# Patient Record
Sex: Female | Born: 1955
Health system: Southern US, Community
[De-identification: ages and names within clinical notes are randomized; demographics above are authoritative.]

## PROBLEM LIST (undated history)

## (undated) DIAGNOSIS — I10 Essential (primary) hypertension: Secondary | ICD-10-CM

## (undated) DIAGNOSIS — M869 Osteomyelitis, unspecified: Secondary | ICD-10-CM

## (undated) DIAGNOSIS — I509 Heart failure, unspecified: Secondary | ICD-10-CM

## (undated) DIAGNOSIS — G629 Polyneuropathy, unspecified: Secondary | ICD-10-CM

## (undated) DIAGNOSIS — K219 Gastro-esophageal reflux disease without esophagitis: Secondary | ICD-10-CM

## (undated) DIAGNOSIS — F419 Anxiety disorder, unspecified: Secondary | ICD-10-CM

## (undated) DIAGNOSIS — E7800 Pure hypercholesterolemia, unspecified: Secondary | ICD-10-CM

## (undated) HISTORY — PX: DEBRIDEMENT  FOOT: SUR387

## (undated) HISTORY — DX: Gastro-esophageal reflux disease without esophagitis: K21.9

## (undated) HISTORY — DX: Osteomyelitis, unspecified: M86.9

## (undated) HISTORY — PX: ABDOMINAL HYSTERECTOMY: SHX81

---

## 1998-12-11 ENCOUNTER — Emergency Department (HOSPITAL_COMMUNITY): Admission: EM | Admit: 1998-12-11 | Discharge: 1998-12-11 | Payer: Self-pay | Admitting: Emergency Medicine

## 1999-06-09 ENCOUNTER — Emergency Department (HOSPITAL_COMMUNITY): Admission: EM | Admit: 1999-06-09 | Discharge: 1999-06-09 | Payer: Self-pay

## 1999-06-12 ENCOUNTER — Emergency Department (HOSPITAL_COMMUNITY): Admission: EM | Admit: 1999-06-12 | Discharge: 1999-06-12 | Payer: Self-pay | Admitting: Emergency Medicine

## 1999-06-18 ENCOUNTER — Encounter: Admission: RE | Admit: 1999-06-18 | Discharge: 1999-06-18 | Payer: Self-pay | Admitting: Internal Medicine

## 1999-07-31 ENCOUNTER — Encounter: Admission: RE | Admit: 1999-07-31 | Discharge: 1999-10-29 | Payer: Self-pay | Admitting: Internal Medicine

## 1999-11-30 ENCOUNTER — Emergency Department (HOSPITAL_COMMUNITY): Admission: EM | Admit: 1999-11-30 | Discharge: 1999-11-30 | Payer: Self-pay | Admitting: Emergency Medicine

## 2000-05-30 ENCOUNTER — Emergency Department (HOSPITAL_COMMUNITY): Admission: EM | Admit: 2000-05-30 | Discharge: 2000-05-30 | Payer: Self-pay | Admitting: Emergency Medicine

## 2001-07-09 ENCOUNTER — Encounter: Admission: RE | Admit: 2001-07-09 | Discharge: 2001-07-09 | Payer: Self-pay | Admitting: Obstetrics

## 2002-10-18 ENCOUNTER — Emergency Department (HOSPITAL_COMMUNITY): Admission: EM | Admit: 2002-10-18 | Discharge: 2002-10-18 | Payer: Self-pay | Admitting: *Deleted

## 2003-08-23 ENCOUNTER — Emergency Department (HOSPITAL_COMMUNITY): Admission: EM | Admit: 2003-08-23 | Discharge: 2003-08-23 | Payer: Self-pay | Admitting: Emergency Medicine

## 2003-08-26 ENCOUNTER — Emergency Department (HOSPITAL_COMMUNITY): Admission: EM | Admit: 2003-08-26 | Discharge: 2003-08-26 | Payer: Self-pay | Admitting: Emergency Medicine

## 2004-03-23 ENCOUNTER — Emergency Department (HOSPITAL_COMMUNITY): Admission: EM | Admit: 2004-03-23 | Discharge: 2004-03-23 | Payer: Self-pay | Admitting: *Deleted

## 2004-09-06 ENCOUNTER — Emergency Department (HOSPITAL_COMMUNITY): Admission: EM | Admit: 2004-09-06 | Discharge: 2004-09-06 | Payer: Self-pay | Admitting: Emergency Medicine

## 2004-09-12 ENCOUNTER — Emergency Department (HOSPITAL_COMMUNITY): Admission: EM | Admit: 2004-09-12 | Discharge: 2004-09-12 | Payer: Self-pay | Admitting: Family Medicine

## 2005-04-15 ENCOUNTER — Emergency Department (HOSPITAL_COMMUNITY): Admission: EM | Admit: 2005-04-15 | Discharge: 2005-04-15 | Payer: Self-pay | Admitting: Emergency Medicine

## 2006-05-15 ENCOUNTER — Emergency Department (HOSPITAL_COMMUNITY): Admission: EM | Admit: 2006-05-15 | Discharge: 2006-05-15 | Payer: Self-pay | Admitting: Emergency Medicine

## 2006-12-22 ENCOUNTER — Emergency Department (HOSPITAL_COMMUNITY): Admission: EM | Admit: 2006-12-22 | Discharge: 2006-12-22 | Payer: Self-pay | Admitting: Emergency Medicine

## 2007-07-25 ENCOUNTER — Emergency Department (HOSPITAL_COMMUNITY): Admission: EM | Admit: 2007-07-25 | Discharge: 2007-07-25 | Payer: Self-pay | Admitting: Emergency Medicine

## 2007-09-01 ENCOUNTER — Emergency Department (HOSPITAL_COMMUNITY): Admission: EM | Admit: 2007-09-01 | Discharge: 2007-09-01 | Payer: Self-pay | Admitting: Emergency Medicine

## 2007-09-03 ENCOUNTER — Emergency Department (HOSPITAL_COMMUNITY): Admission: EM | Admit: 2007-09-03 | Discharge: 2007-09-03 | Payer: Self-pay | Admitting: Emergency Medicine

## 2007-09-09 ENCOUNTER — Ambulatory Visit: Payer: Self-pay | Admitting: *Deleted

## 2007-09-12 ENCOUNTER — Emergency Department (HOSPITAL_COMMUNITY): Admission: EM | Admit: 2007-09-12 | Discharge: 2007-09-12 | Payer: Self-pay | Admitting: Emergency Medicine

## 2007-09-16 ENCOUNTER — Ambulatory Visit: Payer: Self-pay | Admitting: Family Medicine

## 2007-11-10 ENCOUNTER — Ambulatory Visit: Payer: Self-pay | Admitting: Family Medicine

## 2007-12-18 ENCOUNTER — Ambulatory Visit: Payer: Self-pay | Admitting: Family Medicine

## 2008-01-04 ENCOUNTER — Ambulatory Visit (HOSPITAL_COMMUNITY): Admission: RE | Admit: 2008-01-04 | Discharge: 2008-01-04 | Payer: Self-pay | Admitting: Family Medicine

## 2008-01-29 ENCOUNTER — Ambulatory Visit: Payer: Self-pay | Admitting: Family Medicine

## 2008-02-26 ENCOUNTER — Emergency Department (HOSPITAL_COMMUNITY): Admission: EM | Admit: 2008-02-26 | Discharge: 2008-02-26 | Payer: Self-pay | Admitting: Emergency Medicine

## 2008-07-12 ENCOUNTER — Ambulatory Visit: Payer: Self-pay | Admitting: Family Medicine

## 2008-08-15 ENCOUNTER — Ambulatory Visit: Payer: Self-pay | Admitting: Internal Medicine

## 2008-08-15 ENCOUNTER — Encounter (INDEPENDENT_AMBULATORY_CARE_PROVIDER_SITE_OTHER): Payer: Self-pay | Admitting: Family Medicine

## 2008-08-15 LAB — CONVERTED CEMR LAB
BUN: 8 mg/dL (ref 6–23)
Basophils Absolute: 0 10*3/uL (ref 0.0–0.1)
Basophils Relative: 0 % (ref 0–1)
CO2: 26 meq/L (ref 19–32)
Calcium: 8.9 mg/dL (ref 8.4–10.5)
Chloride: 106 meq/L (ref 96–112)
Cholesterol: 301 mg/dL — ABNORMAL HIGH (ref 0–200)
Creatinine, Ser: 0.78 mg/dL (ref 0.40–1.20)
Eosinophils Absolute: 0.1 10*3/uL (ref 0.0–0.7)
Eosinophils Relative: 2 % (ref 0–5)
Glucose, Bld: 145 mg/dL — ABNORMAL HIGH (ref 70–99)
HCT: 43.2 % (ref 36.0–46.0)
HDL: 55 mg/dL (ref >39)
Hemoglobin: 13 g/dL (ref 12.0–15.0)
LDL Cholesterol: 218 mg/dL — ABNORMAL HIGH (ref 0–99)
Lymphocytes Relative: 27 % (ref 12–46)
Lymphs Abs: 2 10*3/uL (ref 0.7–4.0)
MCHC: 30.1 g/dL (ref 30.0–36.0)
MCV: 81.2 fL (ref 78.0–100.0)
Monocytes Absolute: 0.9 10*3/uL (ref 0.1–1.0)
Monocytes Relative: 11 % (ref 3–12)
Neutro Abs: 4.5 10*3/uL (ref 1.7–7.7)
Neutrophils Relative %: 60 % (ref 43–77)
Platelets: 323 10*3/uL (ref 150–400)
Potassium: 4.7 meq/L (ref 3.5–5.3)
RBC: 5.32 M/uL — ABNORMAL HIGH (ref 3.87–5.11)
RDW: 16.9 % — ABNORMAL HIGH (ref 11.5–15.5)
Sed Rate: 12 mm/hr (ref 0–22)
Sodium: 141 meq/L (ref 135–145)
Total CHOL/HDL Ratio: 5.5
Triglycerides: 139 mg/dL (ref <150)
VLDL: 28 mg/dL (ref 0–40)
Vit D, 1,25-Dihydroxy: 12 — ABNORMAL LOW (ref 30–89)
WBC: 7.5 10*3/uL (ref 4.0–10.5)

## 2008-08-22 ENCOUNTER — Emergency Department (HOSPITAL_COMMUNITY): Admission: EM | Admit: 2008-08-22 | Discharge: 2008-08-22 | Payer: Self-pay | Admitting: Emergency Medicine

## 2008-10-05 ENCOUNTER — Emergency Department (HOSPITAL_COMMUNITY): Admission: EM | Admit: 2008-10-05 | Discharge: 2008-10-05 | Payer: Self-pay | Admitting: Emergency Medicine

## 2008-11-24 ENCOUNTER — Emergency Department (HOSPITAL_COMMUNITY): Admission: EM | Admit: 2008-11-24 | Discharge: 2008-11-24 | Payer: Self-pay | Admitting: Emergency Medicine

## 2008-12-23 ENCOUNTER — Ambulatory Visit: Payer: Self-pay | Admitting: Family Medicine

## 2009-03-03 ENCOUNTER — Ambulatory Visit: Payer: Self-pay | Admitting: Family Medicine

## 2009-03-03 LAB — CONVERTED CEMR LAB: Microalb, Ur: 0.5 mg/dL (ref 0.00–1.89)

## 2009-04-27 ENCOUNTER — Ambulatory Visit: Payer: Self-pay | Admitting: Family Medicine

## 2009-05-08 ENCOUNTER — Emergency Department (HOSPITAL_COMMUNITY): Admission: EM | Admit: 2009-05-08 | Discharge: 2009-05-08 | Payer: Self-pay | Admitting: Emergency Medicine

## 2009-05-22 ENCOUNTER — Ambulatory Visit: Payer: Self-pay | Admitting: Internal Medicine

## 2009-07-25 ENCOUNTER — Emergency Department (HOSPITAL_COMMUNITY): Admission: EM | Admit: 2009-07-25 | Discharge: 2009-07-25 | Payer: Self-pay | Admitting: Emergency Medicine

## 2009-08-01 ENCOUNTER — Ambulatory Visit: Payer: Self-pay | Admitting: Family Medicine

## 2009-12-07 ENCOUNTER — Emergency Department (HOSPITAL_COMMUNITY): Admission: EM | Admit: 2009-12-07 | Discharge: 2009-12-07 | Payer: Self-pay | Admitting: Family Medicine

## 2010-01-25 ENCOUNTER — Emergency Department (HOSPITAL_COMMUNITY): Admission: EM | Admit: 2010-01-25 | Discharge: 2010-01-25 | Payer: Self-pay | Admitting: Emergency Medicine

## 2010-03-09 ENCOUNTER — Emergency Department (HOSPITAL_COMMUNITY): Admission: EM | Admit: 2010-03-09 | Discharge: 2010-03-09 | Payer: Self-pay | Admitting: Emergency Medicine

## 2010-04-17 ENCOUNTER — Emergency Department (HOSPITAL_COMMUNITY): Admission: EM | Admit: 2010-04-17 | Discharge: 2010-04-17 | Payer: Self-pay | Admitting: Family Medicine

## 2010-04-20 ENCOUNTER — Ambulatory Visit: Payer: Self-pay | Admitting: Family Medicine

## 2010-04-24 ENCOUNTER — Emergency Department (HOSPITAL_COMMUNITY): Admission: EM | Admit: 2010-04-24 | Discharge: 2010-04-24 | Payer: Self-pay | Admitting: Family Medicine

## 2010-04-27 ENCOUNTER — Emergency Department (HOSPITAL_COMMUNITY): Admission: EM | Admit: 2010-04-27 | Discharge: 2010-04-27 | Payer: Self-pay | Admitting: Emergency Medicine

## 2010-05-24 ENCOUNTER — Emergency Department (HOSPITAL_COMMUNITY): Admission: EM | Admit: 2010-05-24 | Discharge: 2010-05-24 | Payer: Self-pay | Admitting: Family Medicine

## 2010-06-21 ENCOUNTER — Ambulatory Visit: Payer: Self-pay | Admitting: Family Medicine

## 2010-11-20 ENCOUNTER — Encounter (INDEPENDENT_AMBULATORY_CARE_PROVIDER_SITE_OTHER): Payer: Self-pay | Admitting: Family Medicine

## 2010-11-20 LAB — CONVERTED CEMR LAB
ALT: 22 units/L (ref 0–35)
AST: 17 units/L (ref 0–37)
Albumin: 3.9 g/dL (ref 3.5–5.2)
Alkaline Phosphatase: 63 units/L (ref 39–117)
BUN: 13 mg/dL (ref 6–23)
CO2: 25 meq/L (ref 19–32)
Calcium: 9.7 mg/dL (ref 8.4–10.5)
Chloride: 103 meq/L (ref 96–112)
Cholesterol: 311 mg/dL — ABNORMAL HIGH (ref 0–200)
Creatinine, Ser: 0.8 mg/dL (ref 0.40–1.20)
Glucose, Bld: 219 mg/dL — ABNORMAL HIGH (ref 70–99)
HDL: 66 mg/dL (ref >39)
LDL Cholesterol: 196 mg/dL — ABNORMAL HIGH (ref 0–99)
Potassium: 5 meq/L (ref 3.5–5.3)
Sodium: 140 meq/L (ref 135–145)
Total Bilirubin: 0.5 mg/dL (ref 0.3–1.2)
Total CHOL/HDL Ratio: 4.7
Total Protein: 7.1 g/dL (ref 6.0–8.3)
Triglycerides: 243 mg/dL — ABNORMAL HIGH (ref <150)
VLDL: 49 mg/dL — ABNORMAL HIGH (ref 0–40)

## 2011-01-10 ENCOUNTER — Inpatient Hospital Stay (INDEPENDENT_AMBULATORY_CARE_PROVIDER_SITE_OTHER)
Admission: RE | Admit: 2011-01-10 | Discharge: 2011-01-10 | Disposition: A | Discharge status: Home / Self Care | Payer: Self-pay | Source: Ambulatory Visit | Attending: Emergency Medicine | Admitting: Emergency Medicine

## 2011-01-10 DIAGNOSIS — J019 Acute sinusitis, unspecified: Secondary | ICD-10-CM

## 2011-01-10 DIAGNOSIS — J40 Bronchitis, not specified as acute or chronic: Secondary | ICD-10-CM

## 2011-02-10 LAB — WET PREP, GENITAL: Trich, Wet Prep: NONE SEEN

## 2011-02-11 LAB — GC/CHLAMYDIA PROBE AMP, GENITAL
Chlamydia, DNA Probe: NEGATIVE
GC Probe Amp, Genital: NEGATIVE

## 2011-02-11 LAB — POCT URINALYSIS DIP (DEVICE)
Glucose, UA: >=1000 mg/dL — AB
Ketones, ur: 80 mg/dL — AB
Nitrite: NEGATIVE
Protein, ur: NEGATIVE mg/dL
Specific Gravity, Urine: 1.01 (ref 1.005–1.030)
Urobilinogen, UA: 0.2 mg/dL (ref 0.0–1.0)
pH: 5 (ref 5.0–8.0)

## 2011-02-11 LAB — POCT I-STAT, CHEM 8
BUN: 9 mg/dL (ref 6–23)
Calcium, Ion: 1.15 mmol/L (ref 1.12–1.32)
Chloride: 101 mEq/L (ref 96–112)
Creatinine, Ser: 0.9 mg/dL (ref 0.4–1.2)
Glucose, Bld: 399 mg/dL — ABNORMAL HIGH (ref 70–99)
HCT: 57 % — ABNORMAL HIGH (ref 36.0–46.0)
Hemoglobin: 19.4 g/dL — ABNORMAL HIGH (ref 12.0–15.0)
Potassium: 4.3 mEq/L (ref 3.5–5.1)
Sodium: 134 mEq/L — ABNORMAL LOW (ref 135–145)
TCO2: 23 mmol/L (ref 0–100)

## 2011-02-11 LAB — CULTURE, ROUTINE-ABSCESS

## 2011-02-11 LAB — GLUCOSE, CAPILLARY: Glucose-Capillary: 401 mg/dL — ABNORMAL HIGH (ref 70–99)

## 2011-02-11 LAB — WET PREP, GENITAL
Trich, Wet Prep: NONE SEEN
Yeast Wet Prep HPF POC: NONE SEEN

## 2011-02-11 LAB — URINE CULTURE: Colony Count: >=100000

## 2011-02-12 ENCOUNTER — Encounter (INDEPENDENT_AMBULATORY_CARE_PROVIDER_SITE_OTHER): Payer: Self-pay | Admitting: Family Medicine

## 2011-02-12 LAB — URINALYSIS, ROUTINE W REFLEX MICROSCOPIC
Bilirubin Urine: NEGATIVE
Glucose, UA: >1000 mg/dL — AB
Hgb urine dipstick: NEGATIVE
Ketones, ur: NEGATIVE mg/dL
Leukocytes, UA: NEGATIVE
Nitrite: NEGATIVE
Protein, ur: NEGATIVE mg/dL
Specific Gravity, Urine: 1.029 (ref 1.005–1.030)
Urobilinogen, UA: 0.2 mg/dL (ref 0.0–1.0)
pH: 5 (ref 5.0–8.0)

## 2011-02-12 LAB — URINE MICROSCOPIC-ADD ON

## 2011-02-12 LAB — CONVERTED CEMR LAB: Microalb, Ur: 0.5 mg/dL (ref 0.00–1.89)

## 2011-02-17 LAB — URINE MICROSCOPIC-ADD ON

## 2011-02-17 LAB — URINALYSIS, ROUTINE W REFLEX MICROSCOPIC
Bilirubin Urine: NEGATIVE
Glucose, UA: >1000 mg/dL — AB
Hgb urine dipstick: NEGATIVE
Ketones, ur: 15 mg/dL — AB
Leukocytes, UA: NEGATIVE
Nitrite: NEGATIVE
Protein, ur: NEGATIVE mg/dL
Specific Gravity, Urine: 1.039 — ABNORMAL HIGH (ref 1.005–1.030)
Urobilinogen, UA: 0.2 mg/dL (ref 0.0–1.0)
pH: 5 (ref 5.0–8.0)

## 2011-02-17 LAB — GLUCOSE, CAPILLARY: Glucose-Capillary: 356 mg/dL — ABNORMAL HIGH (ref 70–99)

## 2011-02-17 LAB — POCT I-STAT, CHEM 8
BUN: 11 mg/dL (ref 6–23)
Creatinine, Ser: 0.8 mg/dL (ref 0.4–1.2)
Glucose, Bld: 387 mg/dL — ABNORMAL HIGH (ref 70–99)
Sodium: 135 mEq/L (ref 135–145)
TCO2: 25 mmol/L (ref 0–100)

## 2011-02-19 ENCOUNTER — Ambulatory Visit (HOSPITAL_COMMUNITY): Payer: Self-pay | Attending: Family Medicine

## 2011-03-02 LAB — URINALYSIS, ROUTINE W REFLEX MICROSCOPIC
Nitrite: NEGATIVE
Specific Gravity, Urine: 1.021 (ref 1.005–1.030)
Urobilinogen, UA: 0.2 mg/dL (ref 0.0–1.0)
pH: 5.5 (ref 5.0–8.0)

## 2011-03-02 LAB — DIFFERENTIAL
Basophils Absolute: 0.2 10*3/uL — ABNORMAL HIGH (ref 0.0–0.1)
Basophils Relative: 1 % (ref 0–1)
Eosinophils Absolute: 0.1 10*3/uL (ref 0.0–0.7)
Monocytes Relative: 7 % (ref 3–12)
Neutrophils Relative %: 72 % (ref 43–77)

## 2011-03-02 LAB — COMPREHENSIVE METABOLIC PANEL
ALT: 17 U/L (ref 0–35)
Alkaline Phosphatase: 58 U/L (ref 39–117)
CO2: 27 mEq/L (ref 19–32)
Chloride: 101 mEq/L (ref 96–112)
GFR calc non Af Amer: >60 mL/min (ref >60)
Glucose, Bld: 193 mg/dL — ABNORMAL HIGH (ref 70–99)
Potassium: 3.8 mEq/L (ref 3.5–5.1)
Sodium: 135 mEq/L (ref 135–145)
Total Bilirubin: 0.7 mg/dL (ref 0.3–1.2)
Total Protein: 6.7 g/dL (ref 6.0–8.3)

## 2011-03-02 LAB — URINE CULTURE: Colony Count: >=100000

## 2011-03-02 LAB — URINE MICROSCOPIC-ADD ON

## 2011-03-02 LAB — CBC
HCT: 41 % (ref 36.0–46.0)
Hemoglobin: 13.5 g/dL (ref 12.0–15.0)
RBC: 5.21 MIL/uL — ABNORMAL HIGH (ref 3.87–5.11)

## 2011-03-04 LAB — DIFFERENTIAL
Eosinophils Absolute: 0.1 10*3/uL (ref 0.0–0.7)
Eosinophils Relative: 1 % (ref 0–5)
Lymphs Abs: 1.9 10*3/uL (ref 0.7–4.0)
Monocytes Absolute: 0.9 10*3/uL (ref 0.1–1.0)
Monocytes Relative: 8 % (ref 3–12)

## 2011-03-04 LAB — BASIC METABOLIC PANEL
BUN: 15 mg/dL (ref 6–23)
Chloride: 101 mEq/L (ref 96–112)
GFR calc Af Amer: >60 mL/min (ref >60)
Potassium: 4 mEq/L (ref 3.5–5.1)

## 2011-03-04 LAB — URINALYSIS, ROUTINE W REFLEX MICROSCOPIC
Glucose, UA: >1000 mg/dL — AB
Ketones, ur: NEGATIVE mg/dL
Leukocytes, UA: NEGATIVE
Protein, ur: NEGATIVE mg/dL

## 2011-03-04 LAB — CBC
HCT: 44.1 % (ref 36.0–46.0)
MCV: 76.9 fL — ABNORMAL LOW (ref 78.0–100.0)
RBC: 5.73 MIL/uL — ABNORMAL HIGH (ref 3.87–5.11)
WBC: 10.7 10*3/uL — ABNORMAL HIGH (ref 4.0–10.5)

## 2011-03-04 LAB — URINE MICROSCOPIC-ADD ON

## 2011-06-06 ENCOUNTER — Inpatient Hospital Stay (INDEPENDENT_AMBULATORY_CARE_PROVIDER_SITE_OTHER)
Admission: RE | Admit: 2011-06-06 | Discharge: 2011-06-06 | Disposition: A | Discharge status: Home / Self Care | Payer: Self-pay | Source: Ambulatory Visit | Attending: Emergency Medicine | Admitting: Emergency Medicine

## 2011-06-06 DIAGNOSIS — K612 Anorectal abscess: Secondary | ICD-10-CM

## 2011-06-08 ENCOUNTER — Inpatient Hospital Stay (INDEPENDENT_AMBULATORY_CARE_PROVIDER_SITE_OTHER)
Admission: RE | Admit: 2011-06-08 | Discharge: 2011-06-08 | Disposition: A | Discharge status: Home / Self Care | Payer: Self-pay | Source: Ambulatory Visit | Attending: Emergency Medicine | Admitting: Emergency Medicine

## 2011-06-08 DIAGNOSIS — K612 Anorectal abscess: Secondary | ICD-10-CM

## 2011-06-08 LAB — CULTURE, ROUTINE-ABSCESS

## 2011-08-20 LAB — URINE MICROSCOPIC-ADD ON

## 2011-08-20 LAB — GC/CHLAMYDIA PROBE AMP, GENITAL: GC Probe Amp, Genital: NEGATIVE

## 2011-08-20 LAB — URINALYSIS, ROUTINE W REFLEX MICROSCOPIC
Bilirubin Urine: NEGATIVE
Hgb urine dipstick: NEGATIVE
Protein, ur: NEGATIVE
Specific Gravity, Urine: 1.026
Urobilinogen, UA: 0.2

## 2011-08-20 LAB — WET PREP, GENITAL: Trich, Wet Prep: NONE SEEN

## 2011-09-04 LAB — I-STAT 8, (EC8 V) (CONVERTED LAB)
BUN: 13
Bicarbonate: 25.7 — ABNORMAL HIGH
Hemoglobin: 17 — ABNORMAL HIGH
Operator id: 285491
Sodium: 130 — ABNORMAL LOW
TCO2: 27

## 2011-09-04 LAB — URINE CULTURE

## 2011-09-04 LAB — POCT I-STAT CREATININE: Operator id: 285491

## 2011-09-04 LAB — URINALYSIS, ROUTINE W REFLEX MICROSCOPIC
Glucose, UA: >1000 — AB
Protein, ur: NEGATIVE
pH: 5

## 2011-09-04 LAB — URINE MICROSCOPIC-ADD ON

## 2011-09-05 LAB — DIFFERENTIAL
Basophils Absolute: 0
Basophils Relative: 0
Eosinophils Relative: 0
Lymphocytes Relative: 7 — ABNORMAL LOW

## 2011-09-05 LAB — I-STAT 8, (EC8 V) (CONVERTED LAB)
Bicarbonate: 25.5 — ABNORMAL HIGH
Glucose, Bld: 629
TCO2: 27
pCO2, Ven: 40.8 — ABNORMAL LOW
pH, Ven: 7.404 — ABNORMAL HIGH

## 2011-09-05 LAB — URINALYSIS, ROUTINE W REFLEX MICROSCOPIC
Glucose, UA: >1000 — AB
Leukocytes, UA: NEGATIVE
pH: 5

## 2011-09-05 LAB — URINE MICROSCOPIC-ADD ON

## 2011-09-05 LAB — CBC
HCT: 44.6
MCHC: 33.3
Platelets: 341
RDW: 14.5 — ABNORMAL HIGH

## 2011-09-10 ENCOUNTER — Emergency Department (HOSPITAL_COMMUNITY)
Admission: EM | Admit: 2011-09-10 | Discharge: 2011-09-10 | Disposition: A | Discharge status: Home / Self Care | Payer: Self-pay | Attending: Emergency Medicine | Admitting: Emergency Medicine

## 2011-09-10 DIAGNOSIS — Z0389 Encounter for observation for other suspected diseases and conditions ruled out: Secondary | ICD-10-CM | POA: Insufficient documentation

## 2011-09-10 LAB — URINALYSIS, ROUTINE W REFLEX MICROSCOPIC
Bilirubin Urine: NEGATIVE
Ketones, ur: NEGATIVE mg/dL
Nitrite: NEGATIVE
Protein, ur: NEGATIVE mg/dL
Specific Gravity, Urine: 1.012 (ref 1.005–1.030)
Urobilinogen, UA: 0.2 mg/dL (ref 0.0–1.0)

## 2011-09-10 LAB — GLUCOSE, CAPILLARY: Glucose-Capillary: 250 mg/dL — ABNORMAL HIGH (ref 70–99)

## 2011-09-28 ENCOUNTER — Emergency Department (HOSPITAL_COMMUNITY)
Admission: EM | Admit: 2011-09-28 | Discharge: 2011-09-29 | Disposition: A | Discharge status: Home / Self Care | Payer: Self-pay | Attending: Emergency Medicine | Admitting: Emergency Medicine

## 2011-09-28 DIAGNOSIS — Z79899 Other long term (current) drug therapy: Secondary | ICD-10-CM | POA: Insufficient documentation

## 2011-09-28 DIAGNOSIS — R599 Enlarged lymph nodes, unspecified: Secondary | ICD-10-CM | POA: Insufficient documentation

## 2011-09-28 DIAGNOSIS — I10 Essential (primary) hypertension: Secondary | ICD-10-CM | POA: Insufficient documentation

## 2011-09-28 DIAGNOSIS — E119 Type 2 diabetes mellitus without complications: Secondary | ICD-10-CM | POA: Insufficient documentation

## 2011-09-28 DIAGNOSIS — J438 Other emphysema: Secondary | ICD-10-CM | POA: Insufficient documentation

## 2011-09-28 DIAGNOSIS — H9209 Otalgia, unspecified ear: Secondary | ICD-10-CM | POA: Insufficient documentation

## 2011-09-28 DIAGNOSIS — H6690 Otitis media, unspecified, unspecified ear: Secondary | ICD-10-CM | POA: Insufficient documentation

## 2012-01-03 ENCOUNTER — Emergency Department (HOSPITAL_COMMUNITY): Admission: EM | Admit: 2012-01-03 | Discharge: 2012-01-03 | Payer: Self-pay | Source: Home / Self Care

## 2012-01-04 ENCOUNTER — Encounter (HOSPITAL_COMMUNITY): Payer: Self-pay | Admitting: *Deleted

## 2012-01-04 ENCOUNTER — Emergency Department (INDEPENDENT_AMBULATORY_CARE_PROVIDER_SITE_OTHER)
Admission: EM | Admit: 2012-01-04 | Discharge: 2012-01-04 | Disposition: A | Discharge status: Home / Self Care | Payer: Self-pay | Source: Home / Self Care | Attending: Emergency Medicine | Admitting: Emergency Medicine

## 2012-01-04 DIAGNOSIS — B349 Viral infection, unspecified: Secondary | ICD-10-CM

## 2012-01-04 DIAGNOSIS — B9789 Other viral agents as the cause of diseases classified elsewhere: Secondary | ICD-10-CM

## 2012-01-04 HISTORY — DX: Essential (primary) hypertension: I10

## 2012-01-04 HISTORY — DX: Pure hypercholesterolemia, unspecified: E78.00

## 2012-01-04 LAB — POCT URINALYSIS DIP (DEVICE)
Bilirubin Urine: NEGATIVE
Leukocytes, UA: NEGATIVE
Nitrite: NEGATIVE
Protein, ur: NEGATIVE mg/dL
Urobilinogen, UA: 0.2 mg/dL (ref 0.0–1.0)
pH: 6 (ref 5.0–8.0)

## 2012-01-04 MED ORDER — FLUTICASONE PROPIONATE 50 MCG/ACT NA SUSP: 2.0000 | Freq: Every day | NASAL | Status: DC

## 2012-01-04 MED ORDER — GUAIFENESIN ER 600 MG PO TB12: 1200.0000 mg | ORAL_TABLET | Freq: Two times a day (BID) | ORAL | Status: DC

## 2012-01-04 MED ORDER — IBUPROFEN 600 MG PO TABS: 600.0000 mg | ORAL_TABLET | Freq: Four times a day (QID) | ORAL | Status: AC | PRN

## 2012-01-04 MED ORDER — HYDROCODONE-ACETAMINOPHEN 5-325 MG PO TABS: 2.0000 | ORAL_TABLET | ORAL | Status: AC | PRN

## 2012-01-04 NOTE — ED Provider Notes (Cosign Needed)
History     CSN: [id number]  Arrival date & time 01/04/12  0916   First MD Initiated Contact with Patient 01/04/12 1011      Chief Complaint  Patient presents with  . Nasal Congestion  . Cough  . Generalized Body Aches  . Diarrhea  . Back Pain  . Facial Pain    (Consider location/radiation/quality/duration/timing/severity/associated sxs/prior treatment) HPI Comments: Pt with rhinorrhea, postnasal drip, ST, nonproductive cough, fatigue. bodyaches, headaches x 4 days. States feels "hot" and reports chills, but no measured fevers at home. Unable to sleep at night secondary to coughing. Reports bilateral lower back pain starting yesterday. No urinary complaints. No ear pain, wheeze, SOB, abd pain, rash, N/V. Slightly decreased appetite but is tolerating po.  Taking NyQuil with temporary relief. Patient states that her glucose has been running in the 300s, which is higher than normal.   ROS as noted in HPI. All other ROS negative.     Patient is a 56 y.o. female presenting with cough, diarrhea, and back pain. The history is provided by the patient. No language interpreter was used.  Cough This is a new problem. The current episode started more than 2 days ago. The problem occurs constantly. The cough is non-productive. Associated symptoms include chills, headaches, rhinorrhea, sore throat and myalgias. Pertinent negatives include no sweats, no ear congestion, no ear pain, no shortness of breath and no wheezing. She has tried cough syrup for the symptoms. The treatment provided mild relief. She is a smoker.  Diarrhea The primary symptoms include diarrhea and myalgias.  The illness is also significant for chills and back pain.  Back Pain  Associated symptoms include headaches.    Past Medical History  Diagnosis Date  . Diabetes mellitus   . Hypertension   . High cholesterol     Past Surgical History  Procedure Date  . Abdominal hysterectomy     History reviewed. No pertinent  family history.  History  Substance Use Topics  . Smoking status: Current Everyday Smoker  . Smokeless tobacco: Not on file  . Alcohol Use: Yes    OB History    Grav Para Term Preterm Abortions TAB SAB Ect Mult Living                  Review of Systems  Constitutional: Positive for chills.  HENT: Positive for sore throat and rhinorrhea. Negative for ear pain.   Respiratory: Positive for cough. Negative for shortness of breath and wheezing.   Gastrointestinal: Positive for diarrhea.  Musculoskeletal: Positive for myalgias and back pain.  Neurological: Positive for headaches.    Allergies  Codeine and Tramadol  Home Medications   Current Outpatient Rx  Name Route Sig Dispense Refill  . FLUTICASONE PROPIONATE 50 MCG/ACT NA SUSP Nasal Place 2 sprays into the nose daily. 16 g 0  . GLIMEPIRIDE 4 MG PO TABS Oral Take 4 mg by mouth daily before breakfast.     . GUAIFENESIN ER 600 MG PO TB12 Oral Take 2 tablets (1,200 mg total) by mouth 2 (two) times daily. 28 tablet 0  . HYDROCHLOROTHIAZIDE 25 MG PO TABS Oral Take 25 mg by mouth daily.      Marland Kitchen HYDROCODONE-ACETAMINOPHEN 5-325 MG PO TABS Oral Take 2 tablets by mouth every 4 (four) hours as needed for pain. 20 tablet 0  . IBUPROFEN 600 MG PO TABS Oral Take 1 tablet (600 mg total) by mouth every 6 (six) hours as needed for pain. 30 tablet 0  .  METFORMIN HCL 1000 MG PO TABS Oral Take 1,000 mg by mouth 2 (two) times daily with a meal.        BP 124/81  Pulse 79  Temp(Src) 97.8 F (36.6 C) (Oral)  Resp 18  SpO2 97%  Physical Exam  Nursing note and vitals reviewed. Constitutional: She is oriented to person, place, and time. She appears well-developed and well-nourished.  HENT:  Head: Normocephalic and atraumatic.  Right Ear: Tympanic membrane and ear canal normal.  Left Ear: Tympanic membrane and ear canal normal.  Nose: Mucosal edema and rhinorrhea present. No epistaxis.  Mouth/Throat: Uvula is midline and mucous membranes are  normal. Posterior oropharyngeal erythema present. No oropharyngeal exudate.       No purulent nasal discharge. diffuse facial tenderness  Eyes: Conjunctivae and EOM are normal. Pupils are equal, round, and reactive to light.  Neck: Normal range of motion. Neck supple.  Cardiovascular: Normal rate, regular rhythm and normal heart sounds.   Pulmonary/Chest: Effort normal and breath sounds normal. No respiratory distress. She has no wheezes. She has no rales.  Abdominal: Soft. Bowel sounds are normal. She exhibits no distension. There is tenderness in the suprapubic area. There is CVA tenderness. There is no rebound and no guarding.       CVA tenderness left more than right.  Musculoskeletal: Normal range of motion.  Lymphadenopathy:    She has no cervical adenopathy.  Neurological: She is alert and oriented to person, place, and time.  Skin: Skin is warm and dry. No rash noted.  Psychiatric: She has a normal mood and affect. Her behavior is normal. Judgment and thought content normal.    ED Course  Procedures (including critical care time)  Labs Reviewed  POCT URINALYSIS DIP (DEVICE) - Abnormal; Notable for the following:    Glucose, UA 500 (*)    All other components within normal limits   No results found.   1. Viral syndrome     Results for orders placed during the hospital encounter of 01/04/12  POCT URINALYSIS DIP (DEVICE)      Component Value Range   Glucose, UA 500 (*) NEGATIVE (mg/dL)   Bilirubin Urine NEGATIVE  NEGATIVE    Ketones, ur NEGATIVE  NEGATIVE (mg/dL)   Specific Gravity, Urine 1.010  1.005 - 1.030    Hgb urine dipstick NEGATIVE  NEGATIVE    pH 6.0  5.0 - 8.0    Protein, ur NEGATIVE  NEGATIVE (mg/dL)   Urobilinogen, UA 0.2  0.0 - 1.0 (mg/dL)   Nitrite NEGATIVE  NEGATIVE    Leukocytes, UA NEGATIVE  NEGATIVE      MDM  Patient has suprapubic and questionable CVA tenderness, will check UA. Also has mild thoracic tenderness, which could be from coughing.  Otherwise, will treat as viral syndrome. udip noted. Pt with glucosuria on last UA  Luiz Blare, MD 01/04/12 1041

## 2012-01-04 NOTE — ED Notes (Signed)
Pt with onset of cough/congestion/bodyaches/back pain/facial pain/diarrhea onset Thursday  - no loose stools today  Sore with coughing

## 2012-03-31 ENCOUNTER — Emergency Department (HOSPITAL_COMMUNITY)
Admission: EM | Admit: 2012-03-31 | Discharge: 2012-03-31 | Disposition: A | Discharge status: Home / Self Care | Payer: Self-pay | Attending: Emergency Medicine | Admitting: Emergency Medicine

## 2012-03-31 ENCOUNTER — Encounter (HOSPITAL_COMMUNITY): Payer: Self-pay | Admitting: Emergency Medicine

## 2012-03-31 DIAGNOSIS — R102 Pelvic and perineal pain: Secondary | ICD-10-CM

## 2012-03-31 DIAGNOSIS — E7800 Pure hypercholesterolemia, unspecified: Secondary | ICD-10-CM | POA: Insufficient documentation

## 2012-03-31 DIAGNOSIS — N949 Unspecified condition associated with female genital organs and menstrual cycle: Secondary | ICD-10-CM | POA: Insufficient documentation

## 2012-03-31 DIAGNOSIS — B9689 Other specified bacterial agents as the cause of diseases classified elsewhere: Secondary | ICD-10-CM | POA: Insufficient documentation

## 2012-03-31 DIAGNOSIS — M545 Low back pain, unspecified: Secondary | ICD-10-CM | POA: Insufficient documentation

## 2012-03-31 DIAGNOSIS — N760 Acute vaginitis: Secondary | ICD-10-CM | POA: Insufficient documentation

## 2012-03-31 DIAGNOSIS — A499 Bacterial infection, unspecified: Secondary | ICD-10-CM | POA: Insufficient documentation

## 2012-03-31 DIAGNOSIS — I10 Essential (primary) hypertension: Secondary | ICD-10-CM | POA: Insufficient documentation

## 2012-03-31 DIAGNOSIS — R739 Hyperglycemia, unspecified: Secondary | ICD-10-CM

## 2012-03-31 DIAGNOSIS — E1169 Type 2 diabetes mellitus with other specified complication: Secondary | ICD-10-CM | POA: Insufficient documentation

## 2012-03-31 DIAGNOSIS — F17200 Nicotine dependence, unspecified, uncomplicated: Secondary | ICD-10-CM | POA: Insufficient documentation

## 2012-03-31 DIAGNOSIS — G8929 Other chronic pain: Secondary | ICD-10-CM | POA: Insufficient documentation

## 2012-03-31 LAB — URINALYSIS, ROUTINE W REFLEX MICROSCOPIC
Glucose, UA: 500 mg/dL — AB
Hgb urine dipstick: NEGATIVE
Protein, ur: NEGATIVE mg/dL
pH: 6 (ref 5.0–8.0)

## 2012-03-31 LAB — URINE MICROSCOPIC-ADD ON

## 2012-03-31 LAB — DIFFERENTIAL
Basophils Absolute: 0 10*3/uL (ref 0.0–0.1)
Basophils Relative: 0 % (ref 0–1)
Eosinophils Absolute: 0.1 10*3/uL (ref 0.0–0.7)
Neutro Abs: 7 10*3/uL (ref 1.7–7.7)
Neutrophils Relative %: 71 % (ref 43–77)

## 2012-03-31 LAB — COMPREHENSIVE METABOLIC PANEL
AST: 16 U/L (ref 0–37)
Albumin: 3.8 g/dL (ref 3.5–5.2)
Alkaline Phosphatase: 75 U/L (ref 39–117)
Chloride: 100 mEq/L (ref 96–112)
Potassium: 4.2 mEq/L (ref 3.5–5.1)
Total Bilirubin: 0.4 mg/dL (ref 0.3–1.2)
Total Protein: 7.6 g/dL (ref 6.0–8.3)

## 2012-03-31 LAB — WET PREP, GENITAL

## 2012-03-31 LAB — CBC
MCH: 27.2 pg (ref 26.0–34.0)
MCHC: 34.5 g/dL (ref 30.0–36.0)
Platelets: 292 10*3/uL (ref 150–400)
RDW: 15.2 % (ref 11.5–15.5)

## 2012-03-31 LAB — GLUCOSE, CAPILLARY: Glucose-Capillary: 379 mg/dL — ABNORMAL HIGH (ref 70–99)

## 2012-03-31 MED ORDER — DOXYCYCLINE HYCLATE 100 MG PO TABS
100.0000 mg | ORAL_TABLET | Freq: Once | ORAL | Status: AC
  Administered 2012-03-31: 100 mg via ORAL
  Filled 2012-03-31: qty 1

## 2012-03-31 MED ORDER — LIDOCAINE HCL (PF) 1 % IJ SOLN
INTRAMUSCULAR | Status: AC
  Administered 2012-03-31: 14:00:00
  Filled 2012-03-31: qty 5

## 2012-03-31 MED ORDER — DOXYCYCLINE HYCLATE 100 MG PO CAPS: 100.0000 mg | ORAL_CAPSULE | Freq: Two times a day (BID) | ORAL | Status: AC

## 2012-03-31 MED ORDER — SODIUM CHLORIDE 0.9 % IV BOLUS (SEPSIS)
1000.0000 mL | Freq: Once | INTRAVENOUS | Status: AC
  Administered 2012-03-31: 1000 mL via INTRAVENOUS

## 2012-03-31 MED ORDER — METRONIDAZOLE 500 MG PO TABS: 500.0000 mg | ORAL_TABLET | Freq: Two times a day (BID) | ORAL | Status: AC

## 2012-03-31 MED ORDER — CEFTRIAXONE SODIUM 250 MG IJ SOLR
250.0000 mg | Freq: Once | INTRAMUSCULAR | Status: AC
  Administered 2012-03-31: 250 mg via INTRAMUSCULAR
  Filled 2012-03-31: qty 250

## 2012-03-31 MED ORDER — IBUPROFEN 800 MG PO TABS: 800.0000 mg | ORAL_TABLET | Freq: Three times a day (TID) | ORAL | Status: AC

## 2012-03-31 NOTE — ED Notes (Signed)
CBG was 298 mg/dl

## 2012-03-31 NOTE — ED Provider Notes (Signed)
I saw and evaluated the patient, reviewed the resident's note and I agree with the findings and plan.  Pt with lower abdominal pain and back pain.   Has hernia on exam but no signs of incarceration or strangulation.  Plan on pelvic exam and ua.  Discussed importance of follow up with PCP.  Celene Kras, MD 03/31/12 1128

## 2012-03-31 NOTE — ED Notes (Signed)
Went to discharge pt, found IV cath laying in bed intact, pt belongings gone and pt not in room.  Charge RN made aware due to pt. not receiving any discharge paperwork or Rx.

## 2012-03-31 NOTE — ED Notes (Signed)
Pt is undressed and pelvic setup by bedside

## 2012-03-31 NOTE — ED Provider Notes (Signed)
Medical screening examination/treatment/procedure(s) were performed by non-physician practitioner and as supervising physician I was immediately available for consultation/collaboration.    Celene Kras, MD 03/31/12 [phone]

## 2012-03-31 NOTE — ED Notes (Signed)
CBG 379, notified RN

## 2012-03-31 NOTE — ED Notes (Signed)
CBG 378, notified RN

## 2012-03-31 NOTE — ED Provider Notes (Signed)
History     CSN: [id number]  Arrival date & time 03/31/12  1002   First MD Initiated Contact with Patient 03/31/12 1029      Chief Complaint  Patient presents with  . Abdominal Pain    (Consider location/radiation/quality/duration/timing/severity/associated sxs/prior treatment) HPI history present illness chief complaint: Lower abdominal pain, vaginal pain, low back pain. Patient arrived by private vehicle. History provided by patient. No language barriers identified. Information not limited. Onset symptoms 7 months ago. Location pelvis and low back. Symptoms not improved or worsened by anything. Quality dull. Radiation none. Severity mild. Timing is intermittent. Duration 7 months. Context the patient states the low back pain only occurs when it's raining outside or overcast. For associated signs and symptoms please refer to the review of systems. No treatments tried prior to arrival. No recent medical care. Regarding patient's social history please refer to the nurse's notes. Regarding patient's sexual history the patient is monogamous with one partner who has been unfaithful to her recently. I reviewed patient's past medical, past surgical, social history will as well as medications and allergies.  Past Medical History  Diagnosis Date  . Diabetes mellitus   . Hypertension   . High cholesterol     Past Surgical History  Procedure Date  . Abdominal hysterectomy     No family history on file.  History  Substance Use Topics  . Smoking status: Current Everyday Smoker  . Smokeless tobacco: Not on file  . Alcohol Use: Yes    OB History    Grav Para Term Preterm Abortions TAB SAB Ect Mult Living                  Review of Systems  Constitutional: Negative for fever and chills.  HENT: Negative for trouble swallowing, neck pain and neck stiffness.   Eyes: Negative for pain, discharge and itching.  Respiratory: Negative for cough, chest tightness and shortness of breath.     Cardiovascular: Negative for chest pain, palpitations and leg swelling.  Gastrointestinal: Negative for nausea, vomiting, abdominal pain, diarrhea, constipation and blood in stool.  Genitourinary: Positive for vaginal pain and pelvic pain. Negative for dysuria, urgency, frequency, hematuria, flank pain, decreased urine volume, vaginal bleeding, vaginal discharge, difficulty urinating, genital sores and menstrual problem.  Musculoskeletal: Positive for back pain. Negative for joint swelling.  Skin: Negative for rash and wound.  Neurological: Negative for dizziness, tremors, seizures, syncope, facial asymmetry, speech difficulty, weakness, light-headedness, numbness and headaches.  Hematological: Negative for adenopathy. Does not bruise/bleed easily.  Psychiatric/Behavioral: Negative for confusion and decreased concentration.    Allergies  Codeine and Tramadol  Home Medications   Current Outpatient Rx  Name Route Sig Dispense Refill  . FLUTICASONE PROPIONATE 50 MCG/ACT NA SUSP Nasal Place 2 sprays into the nose daily.    Marland Kitchen GLIMEPIRIDE 4 MG PO TABS Oral Take 4 mg by mouth daily before breakfast.     . HYDROCHLOROTHIAZIDE 25 MG PO TABS Oral Take 25 mg by mouth daily.      Marland Kitchen METFORMIN HCL 1000 MG PO TABS Oral Take 1,000 mg by mouth 2 (two) times daily with a meal.        BP 111/75  Pulse 93  Temp(Src) 97.5 F (36.4 C) (Oral)  Resp 16  SpO2 98%  Physical Exam  Constitutional: She is oriented to person, place, and time. She appears well-developed and well-nourished. No distress.  HENT:  Head: Normocephalic and atraumatic.  Eyes: Conjunctivae are normal. Right eye exhibits no  discharge. Left eye exhibits no discharge. No scleral icterus.  Neck: Normal range of motion. Neck supple.  Cardiovascular: Normal rate, regular rhythm, normal heart sounds and intact distal pulses.   No murmur heard. Pulmonary/Chest: Effort normal and breath sounds normal. No respiratory distress. She has no  wheezes. She has no rales. She exhibits no tenderness.  Abdominal: Soft. Bowel sounds are normal. She exhibits no distension, no abdominal bruit, no pulsatile midline mass and no mass. There is no hepatosplenomegaly. There is tenderness in the suprapubic area. There is no rigidity, no rebound, no guarding, no CVA tenderness, no tenderness at McBurney's point and negative Murphy's sign. A hernia is present. Hernia confirmed positive in the right inguinal area. Hernia confirmed negative in the left inguinal area.    Genitourinary: There is no rash, tenderness, lesion or injury on the right labia. There is no rash, tenderness, lesion or injury on the left labia. Uterus is tender. Cervix exhibits motion tenderness. Cervix exhibits no discharge. Right adnexum displays no mass, no tenderness and no fullness. Left adnexum displays no mass, no tenderness and no fullness. No erythema, tenderness or bleeding around the vagina. No foreign body around the vagina. No signs of injury around the vagina. Vaginal discharge found.       Chaperone present at all times. Patient did have a small right inguinal hernia present with Valsalva and cough that self reduced.  Musculoskeletal: Normal range of motion. She exhibits no tenderness.  Lymphadenopathy:       Right: No inguinal adenopathy present.       Left: No inguinal adenopathy present.  Neurological: She is alert and oriented to person, place, and time.  Skin: Skin is warm and dry. She is not diaphoretic.  Psychiatric: She has a normal mood and affect.    ED Course  Procedures (including critical care time)  Labs Reviewed  CBC - Abnormal; Notable for the following:    RBC 6.14 (*)    Hemoglobin 16.7 (*)    HCT 48.4 (*)    All other components within normal limits  COMPREHENSIVE METABOLIC PANEL - Abnormal; Notable for the following:    Glucose, Bld 376 (*)    All other components within normal limits  URINALYSIS, ROUTINE W REFLEX MICROSCOPIC - Abnormal;  Notable for the following:    Glucose, UA 500 (*)    Leukocytes, UA SMALL (*)    All other components within normal limits  GLUCOSE, CAPILLARY - Abnormal; Notable for the following:    Glucose-Capillary 379 (*)    All other components within normal limits  WET PREP, GENITAL - Abnormal; Notable for the following:    Clue Cells Wet Prep HPF POC FEW (*)    WBC, Wet Prep HPF POC FEW (*)    All other components within normal limits  DIFFERENTIAL  URINE MICROSCOPIC-ADD ON  GC/CHLAMYDIA PROBE AMP, GENITAL   No results found.   1. Chronic pelvic pain in female   2. Bacterial vaginosis   3. Hyperglycemia       MDM  Pt is a well-appearing 56 year old post menopausal African American female who presents with 7 months of low back pain, low abdominal pain, pelvic pain and a right groin hernia. Patient states no change in the symptoms she just felt like it was time to get it checked out.Vital signs stable patient afebrile no acute distress. Patient does have a soft reducible right inguinal hernia and is only present with Valsalva or coughing. No concern for incarceration or strangulation  at this time. No peritoneal findings on abdominal exam. No midline spine pain. All patient's back pain is mild and low. Mild pain with range of motion in bilateral hips. No concern for spinal cord compression given no weakness, no numbness, no paresthesias, no fever, chills, night sweats, or incontinence, no urinary tension, no saddle anesthesia, and patient is not on chronic steroids. AAA doubtful as well given no midline pulsatile masses no abdominal bruits. Chronic low back pain possibly secondary to arthritis. Imaging not found to be helpful at this time. Patient will be given a referral number for primary care physician for outpatient management of this. Regarding patient's chronic and unchanged pelvic pain urinalysis and a pelvic exam will be performed. Patient will be given outpatient followup information for  general surgery for her right inguinal hernia. Patient also requested contact information for an OB/GYN to see on outpatient basis as well. This will be provided.  Pelvic exam shows moderate cervical motion tenderness small amount of discharge within the vault. No adnexal masses or tenderness. Given patient is recently separated form her husband for marital unfaithfulness and by today's exam findings I plan to cover the patient empirically with Rocephin and doxycycline.  Wet prep shows bacterial vaginosis. Of note patient has diabetes and is on oral agents states her last 2 days her blood sugars have been underway high for her. First blood sugar here was noted to be 384. Patient was given a liter of fluids and then rechecked and repeat noted to be 378. The nursing given the patient a meal in the interim. Discussed with patient that I would like to bring her blood sugar down before we send the patient out the patient states she just wants to go home and take her medications there. Patient provided with prescriptions for doxy Rocephin and Motrin. Patient was given referral information for general surgery for her inguinal hernia, OB/GYN referral as patient requested, and PCP followup information. Patient discharged in stable condition.  Consuello Masse, MD 03/31/12 1500

## 2012-03-31 NOTE — ED Notes (Signed)
Charge RN Martie Lee contacted pt advised pt she left without paperwork and RX, pt advises she will come by in the am and pick it up.  Charge advises paperwork was left at [employer] [facility] [employer].

## 2012-03-31 NOTE — ED Notes (Signed)
Low back pain abd pain vaginal pain  Has a hernia rt groin that has ggotten big  Needs to have looked at also denies dysuria or vag d/c

## 2012-03-31 NOTE — Discharge Instructions (Signed)
RESOURCE GUIDE  Dental Problems  Patients with Medicaid: [facility]                     [facility] [address]                                           [address] Phone:  [phone]                                                  Phone:  [phone]  If unable to pay or uninsured, contact:  [facility] or [facility] to become qualified for the adult dental clinic.  Chronic Pain Problems Contact [facility]  [phone] Patients need to be referred by their primary care doctor.  Insufficient Money for Medicine Contact [employer]:  call "211" or [facility] 271-5999.  No Primary Care Doctor Call [facility]  832-8000 Other agencies that provide inexpensive medical care    [facility]  [phone]    [facility]  [phone]    [facility]  [phone]    [facility] Clinic  832-4777    [facility]  [phone]    [facility]  [phone]  Psychological Services [facility]  [phone] [facility]  [phone] [facility]   800 853-5163 (emergency services 641-4993)  Substance Abuse Resources [facility]  336-882-2125 [facility] 336-784-9470 [facility] 336-285-9073 [facility] 336-845-3988 Residential & Outpatient Substance Abuse Program  800-659-3381  Abuse/Neglect [place] Child Abuse Hotline (336) 641-3795 [place] [facility] Abuse Hotline 800-378-5315 (After Hours)  Emergency Shelter [facility] (336) 271-5985  [facility] (336) 275-9566 [facility] (704) 372-4663  MRSA Hotline #:   832-7006    [place] [facility]  [facility] [place]     [employer]                          [facility] [address] [place]                       [address]      [address]  [place]                                                [place]                            [place] Phone:  [phone]                                   Phone:  [phone]                 Phone:  [phone]  [facility] Phone:  [phone]    [facility] Child Abuse Hotline 2238282264 539 253 3270 (After Hours)  Pelvic Pain Pelvic pain is pain below the belly button and located between your hips. Acute pain may last a few hours or days. Chronic pelvic pain may last weeks and months. The cause may be different for different types of pain. The pain may be dull or sharp, mild or severe and can interfere with your daily activities. Write down and tell your caregiver:   Exactly where the pain is located.   If it comes and goes or is there all the time.   When it happens (with sex, urination, bowel movement, etc.)   If the pain is related to your menstrual period or stress.  Your caregiver will take a full history and do a complete physical exam and Pap test. CAUSES   Painful menstrual periods (dysmenorrhea).   Normal ovulation (Mittelschmertz) that occurs in the middle of the menstrual cycle every month.   The pelvic organs get engorged with blood just before the menstrual period (pelvic congestive syndrome).   Scar tissue from an infection or past surgery (pelvic adhesions).   Cancer of the female pelvic organs. When there is pain with cancer, it has been there for a long time.   The lining of the uterus (endometrium) abnormally grows in places like the pelvis and on the pelvic organs (endometriosis).   A form of endometriosis with the lining of the uterus present inside of the muscle tissue of the uterus (adenomyosis).   Fibroid tumor (noncancerous) in the uterus.   Bladder problems such as infection, bladder spasms of the muscle tissue of the bladder.   Intestinal problems (irritable bowel syndrome, colitis, an ulcer or  gastrointestinal infection).   Polyps of the cervix or uterus.   Pregnancy in the tube (ectopic pregnancy).   The opening of the cervix is too small for the menstrual blood to flow through it (cervical stenosis).   Physical or sexual abuse (past or present).   Musculo-skeletal problems from poor posture, problems with the vertebrae of the lower back or the uterine pelvic muscles falling (prolapse).   Psychological problems such as depression or stress.   IUD (intrauterine device) in the uterus.  DIAGNOSIS  Tests to make a diagnosis depends on the type, location, severity and what causes the pain to occur. Tests that may be needed include:  Blood tests.   Urine tests   Ultrasound.   X-rays.   CT Scan.   MRI.   Laparoscopy.   Major surgery.  TREATMENT  Treatment will depend on the cause of the pain, which includes:  Prescription or over-the-counter pain medication.   Antibiotics.   Birth control pills.   Hormone treatment.   Nerve blocking injections.   Physical therapy.   Antidepressants.   Counseling with a psychiatrist or psychologist.   Minor or major surgery.  HOME CARE INSTRUCTIONS   Only take over-the-counter or prescription medicines for pain, discomfort or fever as directed by your caregiver.   Follow your caregiver's advice to treat your pain.   Rest.   Avoid sexual intercourse if it causes the pain.   Apply warm or cold compresses (which ever works best) to the pain area.   Do relaxation exercises such as yoga or meditation.   Try acupuncture.   Avoid stressful situations.   Try group therapy.   If the pain is because of a stomach/intestinal upset, drink clear liquids, eat a bland light food diet until the symptoms go away.  SEEK MEDICAL CARE IF:   You need stronger prescription pain medication.   You develop pain with sexual intercourse.   You have pain with urination.   You develop a temperature of 102 F (38.9 C) with the  pain.   You are still in pain after 4 hours of taking prescription medication for the pain.   You need depression medication.   Your IUD is causing pain and you want it removed.  SEEK IMMEDIATE MEDICAL CARE IF:  You develop very severe pain or tenderness.   You faint, have chills, severe weakness or dehydration.   You develop heavy vaginal bleeding or passing solid tissue.   You develop a temperature of 102 F (38.9 C) with the pain.   You have blood in the urine.   You are being physically or sexually abused.   You have uncontrolled vomiting and diarrhea.   You are depressed and afraid of harming yourself or someone else.  Document Released: 12/19/2004 Document Revised: 10/31/2011 Document Reviewed: 09/15/2008 [facility] Patient Information 2012 [place], [place].Bacterial Vaginosis Bacterial vaginosis (BV) is a vaginal infection where the normal balance of bacteria in the vagina is disrupted. The normal balance is then replaced by an overgrowth of certain bacteria. There are several different kinds of bacteria that can cause BV. BV is the most common vaginal infection in women of childbearing age. CAUSES   The cause of BV is not fully understood. BV develops when there is an increase or imbalance of harmful bacteria.   Some activities or behaviors can upset the normal balance of bacteria in the vagina and put women at increased risk including:   Having a new sex partner or multiple sex partners.   Douching.   Using an intrauterine device (IUD) for contraception.   It is not clear what role sexual activity plays in the development of BV. However, women that have never had sexual intercourse are rarely infected with BV.  Women do not get BV from toilet seats, bedding, swimming pools or from touching objects around them.  SYMPTOMS   Grey vaginal discharge.   A fish-like odor with discharge, especially after sexual intercourse.   Itching or burning of the vagina and vulva.     Burning or pain with urination.   Some women have no signs or symptoms at all.  DIAGNOSIS  Your caregiver must examine the vagina for signs of BV. Your caregiver will perform lab tests and look at the sample of vaginal fluid through a microscope. They will look for bacteria and abnormal cells (clue cells), a pH test higher than 4.5, and a positive amine test all associated with BV.  RISKS AND COMPLICATIONS   Pelvic inflammatory disease (PID).   Infections following gynecology surgery.   Developing HIV.   Developing herpes virus.  TREATMENT  Sometimes BV will clear up without treatment. However, all women with symptoms of BV should be treated to avoid complications, especially if gynecology surgery is planned. Female partners generally do not need to be treated. However, BV may spread between female sex partners so treatment is helpful in preventing a recurrence of BV.   BV may be treated with antibiotics. The antibiotics come in either pill or vaginal cream forms. Either can be used with nonpregnant or pregnant women, but the recommended dosages differ. These antibiotics are not harmful to the baby.   BV can recur after treatment. If this happens, a second round of antibiotics will often be prescribed.   Treatment is important for pregnant  women. If not treated, BV can cause a premature delivery, especially for a pregnant woman who had a premature birth in the past. All pregnant women who have symptoms of BV should be checked and treated.   For chronic reoccurrence of BV, treatment with a type of prescribed gel vaginally twice a week is helpful.  HOME CARE INSTRUCTIONS   Finish all medication as directed by your caregiver.   Do not have sex until treatment is completed.   Tell your sexual partner that you have a vaginal infection. They should see their caregiver and be treated if they have problems, such as a mild rash or itching.   Practice safe sex. Use condoms. Only have 1 sex  partner.  PREVENTION  Basic prevention steps can help reduce the risk of upsetting the natural balance of bacteria in the vagina and developing BV:  Do not have sexual intercourse (be abstinent).   Do not douche.   Use all of the medicine prescribed for treatment of BV, even if the signs and symptoms go away.   Tell your sex partner if you have BV. That way, they can be treated, if needed, to prevent reoccurrence.  SEEK MEDICAL CARE IF:   Your symptoms are not improving after 3 days of treatment.   You have increased discharge, pain, or fever.  MAKE SURE YOU:   Understand these instructions.   Will watch your condition.   Will get help right away if you are not doing well or get worse.  FOR MORE INFORMATION  Division of STD Prevention (DSTDP), Centers for Disease Control and Prevention: [employer] American Social Health Association (ASHA): www.ashastd.org  Document Released: 11/11/2005 Document Revised: 10/31/2011 Document Reviewed: 05/04/2009 [facility] Patient Information 2012 [place], [place].Back Pain, Adult Low back pain is very common. About 1 in 5 people have back pain.The cause of low back pain is rarely dangerous. The pain often gets better over time.About half of people with a sudden onset of back pain feel better in just 2 weeks. About 8 in 10 people feel better by 6 weeks.  CAUSES Some common causes of back pain include:  Strain of the muscles or ligaments supporting the spine.   Wear and tear (degeneration) of the spinal discs.   Arthritis.   Direct injury to the back.  DIAGNOSIS Most of the time, the direct cause of low back pain is not known.However, back pain can be treated effectively even when the exact cause of the pain is unknown.Answering your caregiver's questions about your overall health and symptoms is one of the most accurate ways to make sure the cause of your pain is not dangerous. If your caregiver needs more information, he or she may order  lab work or imaging tests (X-rays or MRIs).However, even if imaging tests show changes in your back, this usually does not require surgery. HOME CARE INSTRUCTIONS For many people, back pain returns.Since low back pain is rarely dangerous, it is often a condition that people can learn to [facility] their own.   Remain active. It is stressful on the back to sit or stand in one place. Do not sit, drive, or stand in one place for more than 30 minutes at a time. Take short walks on level surfaces as soon as pain allows.Try to increase the length of time you walk each day.   Do not stay in bed.Resting more than 1 or 2 days can delay your recovery.   Do not avoid exercise or work.Your body is made to  move.It is not dangerous to be active, even though your back may hurt.Your back will likely heal faster if you return to being active before your pain is gone.   Pay attention to your body when you bend and lift. Many people have less discomfortwhen lifting if they bend their knees, keep the load close to their bodies,and avoid twisting. Often, the most comfortable positions are those that put less stress on your recovering back.   Find a comfortable position to sleep. Use a firm mattress and lie on your side with your knees slightly bent. If you lie on your back, put a pillow under your knees.   Only take over-the-counter or prescription medicines as directed by your caregiver. Over-the-counter medicines to reduce pain and inflammation are often the most helpful.Your caregiver may prescribe muscle relaxant drugs.These medicines help dull your pain so you can more quickly return to your normal activities and healthy exercise.   Put ice on the injured area.   Put ice in a plastic bag.   Place a towel between your skin and the bag.   Leave the ice on for 15 to 20 minutes, 3 to 4 times a day for the first 2 to 3 days. After that, ice and heat may be alternated to reduce pain and spasms.   Ask  your caregiver about trying back exercises and gentle massage. This may be of some benefit.   Avoid feeling anxious or stressed.Stress increases muscle tension and can worsen back pain.It is important to recognize when you are anxious or stressed and learn ways to manage it.Exercise is a great option.  SEEK MEDICAL CARE IF:  You have pain that is not relieved with rest or medicine.   You have pain that does not improve in 1 week.   You have new symptoms.   You are generally not feeling well.  SEEK IMMEDIATE MEDICAL CARE IF:   You have pain that radiates from your back into your legs.   You develop new bowel or bladder control problems.   You have unusual weakness or numbness in your arms or legs.   You develop nausea or vomiting.   You develop abdominal pain.   You feel faint.  Document Released: 11/11/2005 Document Revised: 10/31/2011 Document Reviewed: 04/01/2011 [employer] Patient Information 2012 [employer], [place]  Pelvic Inflammatory Disease Pelvic Inflammatory Disease (PID) is an infection in some or all of your female organs. This includes the womb (uterus), ovaries, fallopian tubes and tissues in the pelvis. PID is a common cause of sudden onset (acute) lower abdominal (pelvic) pain. PID can be treated, but it is a serious infection. It may take weeks before you are completely well. In some cases, hospitalization is needed for surgery or to administer medications to kill germs (antibiotics) through your veins (intravenously). CAUSES   It may be caused by germs that are spread during sexual contact.   PID can also occur following:   The birth of a baby.   A miscarriage.   An abortion.   Major surgery of the pelvis.   Use of an IUD.   Sexual assault.  SYMPTOMS   Abdominal or pelvic pain.   Fever.   Chills.   Abnormal vaginal discharge.  DIAGNOSIS  Your caregiver will choose some of these methods to make a diagnosis:  A physical exam and history.    Blood tests.   Cultures of the vagina and cervix.   X-rays or ultrasound.   A procedure to look inside the  pelvis (laparoscopy).  TREATMENT   Use of antibiotics by mouth or intravenously.   Treatment of sexual partners when the infection is an sexually transmitted disease (STD).   Hospitalization and surgery may be needed.  RISKS AND COMPLICATIONS   PID can cause women to become unable to have children (sterile) if left untreated or if partially treated. That is why it is important to finish all medications given to you.   Sterility or future tubal (ectopic) pregnancies can occur in fully treated individuals. This is why it is so important to follow your prescribed treatment.   It can cause longstanding (chronic) pelvic pain after frequent infections.   Painful intercourse.   Pelvic abscesses.   In rare cases, surgery or a hysterectomy may be needed.   If this is a sexually transmitted infection (STI), you are also at risk for any other STD including AIDSor human papillomavirus (HPV).  HOME CARE INSTRUCTIONS   Finish all medication as prescribed. Incomplete treatment will put you at risk for sterility and tubal pregnancy.   Only take over-the-counter or prescription medicines for pain, discomfort, or fever as directed by your caregiver.   Do not have sex until treatment is completed or as directed by your caregiver. If PID is confirmed, your recent sexual contacts will need treatment.   Keep your follow-up appointments.  SEEK MEDICAL CARE IF:   You have increased or abnormal vaginal discharge.   You need prescription medication for your pain.   Your partner has an STD.   You are vomiting.   You cannot take your medications.  SEEK IMMEDIATE MEDICAL CARE IF:   You have a fever.   You develop increased abdominal or pelvic pain.   You develop chills.   You have pain when you urinate.   You are not better after 72 hours following treatment.  Document  Released: 11/11/2005 Document Revised: 10/31/2011 Document Reviewed: 07/25/2007 [facility] Patient Information 2012 [place], [place].C.

## 2012-03-31 NOTE — ED Notes (Signed)
All has been going on x 3-4 months has not seen any one for it

## 2012-04-01 LAB — GLUCOSE, CAPILLARY
Glucose-Capillary: 298 mg/dL — ABNORMAL HIGH (ref 70–99)
Glucose-Capillary: 378 mg/dL — ABNORMAL HIGH (ref 70–99)

## 2012-04-22 ENCOUNTER — Ambulatory Visit (INDEPENDENT_AMBULATORY_CARE_PROVIDER_SITE_OTHER): Payer: Self-pay | Admitting: General Surgery

## 2012-09-07 ENCOUNTER — Emergency Department (HOSPITAL_COMMUNITY)
Admission: EM | Admit: 2012-09-07 | Discharge: 2012-09-07 | Disposition: A | Discharge status: Home / Self Care | Payer: Medicaid Other | Attending: Emergency Medicine | Admitting: Emergency Medicine

## 2012-09-07 ENCOUNTER — Encounter (HOSPITAL_COMMUNITY): Payer: Self-pay | Admitting: Emergency Medicine

## 2012-09-07 DIAGNOSIS — F17200 Nicotine dependence, unspecified, uncomplicated: Secondary | ICD-10-CM | POA: Insufficient documentation

## 2012-09-07 DIAGNOSIS — N760 Acute vaginitis: Secondary | ICD-10-CM | POA: Insufficient documentation

## 2012-09-07 DIAGNOSIS — R739 Hyperglycemia, unspecified: Secondary | ICD-10-CM

## 2012-09-07 DIAGNOSIS — N949 Unspecified condition associated with female genital organs and menstrual cycle: Secondary | ICD-10-CM | POA: Insufficient documentation

## 2012-09-07 DIAGNOSIS — E7800 Pure hypercholesterolemia, unspecified: Secondary | ICD-10-CM | POA: Insufficient documentation

## 2012-09-07 DIAGNOSIS — E1169 Type 2 diabetes mellitus with other specified complication: Secondary | ICD-10-CM | POA: Insufficient documentation

## 2012-09-07 DIAGNOSIS — I10 Essential (primary) hypertension: Secondary | ICD-10-CM | POA: Insufficient documentation

## 2012-09-07 DIAGNOSIS — A499 Bacterial infection, unspecified: Secondary | ICD-10-CM | POA: Insufficient documentation

## 2012-09-07 DIAGNOSIS — B9689 Other specified bacterial agents as the cause of diseases classified elsewhere: Secondary | ICD-10-CM | POA: Insufficient documentation

## 2012-09-07 DIAGNOSIS — N764 Abscess of vulva: Secondary | ICD-10-CM

## 2012-09-07 LAB — URINALYSIS, ROUTINE W REFLEX MICROSCOPIC
Ketones, ur: NEGATIVE mg/dL
Nitrite: NEGATIVE
Specific Gravity, Urine: 1.042 — ABNORMAL HIGH (ref 1.005–1.030)
Urobilinogen, UA: 0.2 mg/dL (ref 0.0–1.0)
pH: 5.5 (ref 5.0–8.0)

## 2012-09-07 LAB — WET PREP, GENITAL
Trich, Wet Prep: NONE SEEN
Yeast Wet Prep HPF POC: NONE SEEN

## 2012-09-07 LAB — BASIC METABOLIC PANEL
Chloride: 96 mEq/L (ref 96–112)
GFR calc Af Amer: 88 mL/min — ABNORMAL LOW (ref >90)
GFR calc non Af Amer: 76 mL/min — ABNORMAL LOW (ref >90)
Potassium: 4.1 mEq/L (ref 3.5–5.1)

## 2012-09-07 LAB — POCT I-STAT 3, ART BLOOD GAS (G3+)
Acid-base deficit: 1 mmol/L (ref 0.0–2.0)
Bicarbonate: 23.4 mEq/L (ref 20.0–24.0)
O2 Saturation: 96 %
TCO2: 25 mmol/L (ref 0–100)
pO2, Arterial: 81 mmHg (ref 80.0–100.0)

## 2012-09-07 LAB — GLUCOSE, CAPILLARY

## 2012-09-07 LAB — CBC WITH DIFFERENTIAL/PLATELET
Basophils Absolute: 0 10*3/uL (ref 0.0–0.1)
Basophils Relative: 0 % (ref 0–1)
Eosinophils Absolute: 0.1 10*3/uL (ref 0.0–0.7)
MCHC: 33.2 g/dL (ref 30.0–36.0)
Neutro Abs: 8.9 10*3/uL — ABNORMAL HIGH (ref 1.7–7.7)
Neutrophils Relative %: 77 % (ref 43–77)
Platelets: 309 10*3/uL (ref 150–400)
RDW: 14 % (ref 11.5–15.5)

## 2012-09-07 LAB — URINE MICROSCOPIC-ADD ON

## 2012-09-07 LAB — PREGNANCY, URINE: Preg Test, Ur: NEGATIVE

## 2012-09-07 MED ORDER — FENTANYL CITRATE 0.05 MG/ML IJ SOLN
100.0000 ug | Freq: Once | INTRAMUSCULAR | Status: AC
  Administered 2012-09-07: 19:00:00 via INTRAVENOUS

## 2012-09-07 MED ORDER — INSULIN ASPART 100 UNIT/ML ~~LOC~~ SOLN
10.0000 [IU] | Freq: Once | SUBCUTANEOUS | Status: AC
  Administered 2012-09-07: 10 [IU] via INTRAVENOUS
  Filled 2012-09-07: qty 1

## 2012-09-07 MED ORDER — SODIUM CHLORIDE 0.9 % IV BOLUS (SEPSIS)
1000.0000 mL | Freq: Once | INTRAVENOUS | Status: AC
  Administered 2012-09-07: 1000 mL via INTRAVENOUS

## 2012-09-07 MED ORDER — METRONIDAZOLE 500 MG PO TABS
500.0000 mg | ORAL_TABLET | Freq: Once | ORAL | Status: AC
  Administered 2012-09-07: 500 mg via ORAL
  Filled 2012-09-07: qty 1

## 2012-09-07 MED ORDER — FENTANYL CITRATE 0.05 MG/ML IJ SOLN
INTRAMUSCULAR | Status: AC
  Filled 2012-09-07: qty 2

## 2012-09-07 MED ORDER — METRONIDAZOLE 500 MG PO TABS: 500.0000 mg | ORAL_TABLET | Freq: Once | ORAL | Status: DC

## 2012-09-07 MED ORDER — METFORMIN HCL 1000 MG PO TABS: 1000.0000 mg | ORAL_TABLET | Freq: Two times a day (BID) | ORAL | Status: DC

## 2012-09-07 NOTE — ED Notes (Signed)
Pt c/o vaginal pain and discharge that she thinks is yeast from not having her DM meds since [facility]; pt sts pain in rectum and thinks thrush in mouth; pt sts weight loss

## 2012-09-07 NOTE — ED Notes (Signed)
Pt reports when her sugar is high, she gets a yeast infection and that her vagina is red, with cuts and knots.

## 2012-09-07 NOTE — ED Notes (Signed)
Gluc 131

## 2012-09-07 NOTE — ED Provider Notes (Signed)
History     CSN: [id number]  Arrival date & time 09/07/12  1310   First MD Initiated Contact with Patient 09/07/12 1534      Chief Complaint  Patient presents with  . Vaginal Discharge  . Hyperglycemia    HPI   56 year old female with history of diabetes, hypertension and hypercholesterolemia presents with polydipsia, polyurea, nausea, vaginal pain and discharge. She stopped taking her metformin 3 months ago when [employer] shutdown.  His doctor any over-the-counter medications. She says the pain is primarily over her right labia, does not radiate, it is worsened by palpation.  Past Medical History  Diagnosis Date  . Diabetes mellitus   . Hypertension   . High cholesterol     Past Surgical History  Procedure Date  . Abdominal hysterectomy     History reviewed. No pertinent family history.  History  Substance Use Topics  . Smoking status: Current Every Day Smoker  . Smokeless tobacco: Not on file  . Alcohol Use: Yes    OB History    Grav Para Term Preterm Abortions TAB SAB Ect Mult Living                  Review of Systems  Constitutional: Negative for fever, chills, activity change and appetite change.  HENT: Negative for ear pain, congestion, rhinorrhea and neck pain.   Eyes: Negative for pain.  Respiratory: Negative for cough and shortness of breath.   Cardiovascular: Negative for chest pain and palpitations.  Gastrointestinal: Negative for nausea, vomiting and abdominal pain.  Genitourinary: Negative for dysuria, difficulty urinating and pelvic pain.  Musculoskeletal: Negative for back pain.  Skin: Negative for rash and wound.  Neurological: Negative for weakness and headaches.  Psychiatric/Behavioral: Negative for behavioral problems, confusion and agitation.    Allergies  Codeine and Tramadol  Home Medications   Current Outpatient Rx  Name Route Sig Dispense Refill  . GLIMEPIRIDE 4 MG PO TABS Oral Take 4 mg by mouth daily before breakfast.       . HYDROCHLOROTHIAZIDE 25 MG PO TABS Oral Take 25 mg by mouth daily.      Marland Kitchen METFORMIN HCL 1000 MG PO TABS Oral Take 1,000 mg by mouth 2 (two) times daily with a meal.        BP 107/67  Pulse 95  Temp 98.2 F (36.8 C) (Oral)  Resp 18  SpO2 98%  Physical Exam  ED Course  INCISION AND DRAINAGE Date/Time: 09/07/2012 6:50 PM Performed by: Nadara Mustard Authorized by: Nadara Mustard Consent: Verbal consent obtained. Consent given by: patient Patient identity confirmed: verbally with patient and arm band Type: abscess Body area: anogenital Location details: Bartholin's gland Anesthesia: local infiltration Local anesthetic: lidocaine 2% with epinephrine Anesthetic total: 3 ml Patient sedated: no Needle gauge: 18 Incision type: single straight Complexity: simple Drainage: purulent Drainage amount: scant Wound treatment: wound left open Packing material: none Patient tolerance: Patient tolerated the procedure well with no immediate complications.      Results for orders placed during the hospital encounter of 09/07/12  CBC WITH DIFFERENTIAL      Component Value Range   WBC 11.6 (*) 4.0 - 10.5 K/uL   RBC 5.39 (*) 3.87 - 5.11 MIL/uL   Hemoglobin 14.1  12.0 - 15.0 g/dL   HCT 40.9  81.1 - 91.4 %   MCV 78.8  78.0 - 100.0 fL   MCH 26.2  26.0 - 34.0 pg   MCHC 33.2  30.0 - 36.0 g/dL  RDW 14.0  11.5 - 15.5 %   Platelets 309  150 - 400 K/uL   Neutrophils Relative 77  43 - 77 %   Neutro Abs 8.9 (*) 1.7 - 7.7 K/uL   Lymphocytes Relative 14  12 - 46 %   Lymphs Abs 1.6  0.7 - 4.0 K/uL   Monocytes Relative 8  3 - 12 %   Monocytes Absolute 0.9  0.1 - 1.0 K/uL   Eosinophils Relative 1  0 - 5 %   Eosinophils Absolute 0.1  0.0 - 0.7 K/uL   Basophils Relative 0  0 - 1 %   Basophils Absolute 0.0  0.0 - 0.1 K/uL  BASIC METABOLIC PANEL      Component Value Range   Sodium 133 (*) 135 - 145 mEq/L   Potassium 4.1  3.5 - 5.1 mEq/L   Chloride 96  96 - 112 mEq/L   CO2 24  19 - 32 mEq/L    Glucose, Bld 559 (*) 70 - 99 mg/dL   BUN 8  6 - 23 mg/dL   Creatinine, Ser 1.61  0.50 - 1.10 mg/dL   Calcium 9.2  8.4 - 09.6 mg/dL   GFR calc non Af Amer 76 (*) >90 mL/min   GFR calc Af Amer 88 (*) >90 mL/min  URINALYSIS, ROUTINE W REFLEX MICROSCOPIC      Component Value Range   Color, Urine YELLOW  YELLOW   APPearance HAZY (*) CLEAR   Specific Gravity, Urine 1.042 (*) 1.005 - 1.030   pH 5.5  5.0 - 8.0   Glucose, UA >1000 (*) NEGATIVE mg/dL   Hgb urine dipstick NEGATIVE  NEGATIVE   Bilirubin Urine NEGATIVE  NEGATIVE   Ketones, ur NEGATIVE  NEGATIVE mg/dL   Protein, ur NEGATIVE  NEGATIVE mg/dL   Urobilinogen, UA 0.2  0.0 - 1.0 mg/dL   Nitrite NEGATIVE  NEGATIVE   Leukocytes, UA SMALL (*) NEGATIVE  GLUCOSE, CAPILLARY      Component Value Range   Glucose-Capillary 502 (*) 70 - 99 mg/dL   Comment 1 Documented in Chart     Comment 2 Notify RN    URINE MICROSCOPIC-ADD ON      Component Value Range   Squamous Epithelial / LPF FEW (*) RARE   WBC, UA 7-10  <3 WBC/hpf   RBC / HPF 0-2  <3 RBC/hpf   Bacteria, UA FEW (*) RARE   Urine-Other FEW YEAST    WET PREP, GENITAL      Component Value Range   Yeast Wet Prep HPF POC NONE SEEN  NONE SEEN   Trich, Wet Prep NONE SEEN  NONE SEEN   Clue Cells Wet Prep HPF POC FEW (*) NONE SEEN   WBC, Wet Prep HPF POC FEW (*) NONE SEEN  POCT I-STAT 3, BLOOD GAS (G3+)      Component Value Range   pH, Arterial 7.395  7.350 - 7.450   pCO2 arterial 38.2  35.0 - 45.0 mmHg   pO2, Arterial 81.0  80.0 - 100.0 mmHg   Bicarbonate 23.4  20.0 - 24.0 mEq/L   TCO2 25  0 - 100 mmol/L   O2 Saturation 96.0     Acid-base deficit 1.0  0.0 - 2.0 mmol/L   Collection site RADIAL, ALLEN'S TEST ACCEPTABLE     Drawn by Operator     Sample type ARTERIAL    PREGNANCY, URINE      Component Value Range   Preg Test, Ur NEGATIVE  NEGATIVE  GLUCOSE, CAPILLARY      Component Value Range   Glucose-Capillary 131 (*) 70 - 99 mg/dL     1. Labial abscess   2.  Hyperglycemia   3. Bacterial vaginosis       MDM    7-year-old female in no acute distress, afebrile, vital signs stable, non toxic appearing who presents with vaginal itching and discharge. She also reports dysuria polydipsia. Patient hyperglycemic on presentation. Received a liter of normal saline bolus and insulin. ABG with a normal pH doubt DKA. Osmolality 300. Doubt HHS. Pregnancy test negative. No UTI. Prep demonstrated cells white blood cells. Small abscess to right labia consistent with Bartholin's gland cyst. Drained as described above. The patient tolerated procedure well. Received first dose of Flagyl in emergency department. No cervical motion tenderness. Provided patient with resource sheet will establish with a PCP for her diabetes management. Will return to emergent arm and immediately should any evidence of infection or any other red flecks or as needed.  New Prescriptions   METFORMIN (GLUCOPHAGE) 1000 MG TABLET    Take 1 tablet (1,000 mg total) by mouth 2 (two) times daily.   METRONIDAZOLE (FLAGYL) 500 MG TABLET    Take 1 tablet (500 mg total) by mouth once.       Nadara Mustard, MD 09/08/12 [phone]

## 2012-09-08 LAB — GC/CHLAMYDIA PROBE AMP, GENITAL: Chlamydia, DNA Probe: NEGATIVE

## 2012-09-08 NOTE — ED Provider Notes (Signed)
I was present consultation and examined the patient in question during their ED stay and agree with the resident's documentation and resident's medical plan.  Jones Skene, MD   Jones Skene, MD 09/08/12 [phone]

## 2012-09-16 ENCOUNTER — Emergency Department (HOSPITAL_COMMUNITY)
Admission: EM | Admit: 2012-09-16 | Discharge: 2012-09-16 | Disposition: A | Discharge status: Home / Self Care | Payer: Medicaid Other | Attending: Emergency Medicine | Admitting: Emergency Medicine

## 2012-09-16 ENCOUNTER — Encounter (HOSPITAL_COMMUNITY): Payer: Self-pay | Admitting: Physical Medicine and Rehabilitation

## 2012-09-16 ENCOUNTER — Emergency Department (HOSPITAL_COMMUNITY): Payer: Medicaid Other

## 2012-09-16 DIAGNOSIS — I10 Essential (primary) hypertension: Secondary | ICD-10-CM | POA: Insufficient documentation

## 2012-09-16 DIAGNOSIS — F17200 Nicotine dependence, unspecified, uncomplicated: Secondary | ICD-10-CM | POA: Insufficient documentation

## 2012-09-16 DIAGNOSIS — E119 Type 2 diabetes mellitus without complications: Secondary | ICD-10-CM | POA: Insufficient documentation

## 2012-09-16 DIAGNOSIS — M79609 Pain in unspecified limb: Secondary | ICD-10-CM | POA: Insufficient documentation

## 2012-09-16 DIAGNOSIS — E7800 Pure hypercholesterolemia, unspecified: Secondary | ICD-10-CM | POA: Insufficient documentation

## 2012-09-16 DIAGNOSIS — M79673 Pain in unspecified foot: Secondary | ICD-10-CM

## 2012-09-16 MED ORDER — CIPROFLOXACIN HCL 500 MG PO TABS: 500.0000 mg | ORAL_TABLET | Freq: Two times a day (BID) | ORAL | Status: DC

## 2012-09-16 MED ORDER — IBUPROFEN 200 MG PO TABS
800.0000 mg | ORAL_TABLET | Freq: Once | ORAL | Status: DC
  Filled 2012-09-16: qty 4

## 2012-09-16 MED ORDER — HYDROCODONE-ACETAMINOPHEN 5-325 MG PO TABS
1.0000 | ORAL_TABLET | Freq: Once | ORAL | Status: AC
  Administered 2012-09-16: 1 via ORAL
  Filled 2012-09-16: qty 1

## 2012-09-16 MED ORDER — OXYCODONE-ACETAMINOPHEN 5-325 MG PO TABS: 1.0000 | ORAL_TABLET | ORAL | Status: DC | PRN

## 2012-09-16 NOTE — ED Notes (Addendum)
Patient refused pain medication.  Patient states, "I told her that messes up my stomach; I don't want that".

## 2012-09-16 NOTE — ED Notes (Signed)
Patient repositioned in bed per patient request.  Patient provided warm blanket per request.

## 2012-09-16 NOTE — ED Notes (Signed)
Pt c/o right heel pain. Pt reports she thinks she stepped on something outside on Monday. Pt denies bleeding or object in foot at time of accident. Pt able to walk on it but reports its very painful. No obvious swelling or deformity seen.

## 2012-09-16 NOTE — ED Notes (Signed)
Pt presents to department for evaluation of R foot pain. States she stepped on rock Monday night, now states increased pain and soreness to bottom of L foot. 10/10 pain at the time. Ambulatory to triage. No signs of acute distress noted.

## 2012-09-16 NOTE — ED Notes (Signed)
Pt called in waiting room, no response 

## 2012-09-16 NOTE — ED Notes (Signed)
Patient transported to X-ray 

## 2012-09-17 NOTE — ED Provider Notes (Signed)
History     CSN: [id number]  Arrival date & time 09/16/12  1443   First MD Initiated Contact with Patient 09/16/12 1637      Chief Complaint  Patient presents with  . Foot Pain    (Consider location/radiation/quality/duration/timing/severity/associated sxs/prior treatment) Patient is a 56 y.o. female presenting with lower extremity pain. The history is provided by the patient. No language interpreter was used.  Foot Pain This is a new problem. The current episode started in the past 7 days. The problem occurs constantly. The problem has been unchanged. Pertinent negatives include no fever, nausea or vomiting. The symptoms are aggravated by walking. She has tried nothing for the symptoms.  56 yo diabetic with R heel pain after stepping on a rock night before last.  Tenderness and swelling noted.  + CMS  Past Medical History  Diagnosis Date  . Diabetes mellitus   . Hypertension   . High cholesterol     Past Surgical History  Procedure Date  . Abdominal hysterectomy     No family history on file.  History  Substance Use Topics  . Smoking status: Current Every Day Smoker  . Smokeless tobacco: Not on file  . Alcohol Use: Yes    OB History    Grav Para Term Preterm Abortions TAB SAB Ect Mult Living                  Review of Systems  Constitutional: Negative.  Negative for fever.  HENT: Negative.   Eyes: Negative.   Respiratory: Negative.   Cardiovascular: Negative.   Gastrointestinal: Negative.  Negative for nausea and vomiting.  Musculoskeletal:       R heel pain  Neurological: Negative.   Psychiatric/Behavioral: Negative.   All other systems reviewed and are negative.    Allergies  Codeine and Tramadol  Home Medications   Current Outpatient Rx  Name Route Sig Dispense Refill  . GLIMEPIRIDE 4 MG PO TABS Oral Take 4 mg by mouth daily before breakfast.    . HYDROCHLOROTHIAZIDE 25 MG PO TABS Oral Take 25 mg by mouth daily.    Marland Kitchen METFORMIN HCL 1000 MG PO  TABS Oral Take 1 tablet (1,000 mg total) by mouth 2 (two) times daily. 30 tablet 0  . METRONIDAZOLE 500 MG PO TABS Oral Take 500 mg by mouth daily. 10/14 For 13 days    . CIPROFLOXACIN HCL 500 MG PO TABS Oral Take 1 tablet (500 mg total) by mouth 2 (two) times daily. 10 tablet 0  . OXYCODONE-ACETAMINOPHEN 5-325 MG PO TABS Oral Take 1 tablet by mouth every 4 (four) hours as needed for pain. 10 tablet 0    BP 122/76  Pulse 99  Temp 98.9 F (37.2 C) (Oral)  Resp 18  SpO2 99%  Physical Exam  Nursing note and vitals reviewed. Constitutional: She is oriented to person, place, and time. She appears well-developed and well-nourished.  HENT:  Head: Normocephalic and atraumatic.  Eyes: Conjunctivae normal and EOM are normal. Pupils are equal, round, and reactive to light.  Neck: Normal range of motion. Neck supple.  Cardiovascular: Normal rate.   Pulmonary/Chest: Effort normal.  Abdominal: Soft.  Musculoskeletal: Normal range of motion. She exhibits edema and tenderness.       r heel tenderness and swelling no erythema  Neurological: She is alert and oriented to person, place, and time. She has normal reflexes.  Skin: Skin is warm and dry.  Psychiatric: She has a normal mood and affect.  ED Course  Procedures (including critical care time)  Labs Reviewed - No data to display Dg Foot Complete Right  09/16/2012  *RADIOLOGY REPORT*  Clinical Data: Heel pain and swelling, slipped on something in driveway  RIGHT FOOT COMPLETE - 3+ VIEW  Comparison: None  Findings: Bones are questionably demineralized. Joint spaces preserved. Small plantar calcaneal spur. No acute fracture, dislocation or bone destruction.  IMPRESSION: Calcaneal spurring. No acute abnormalities.   Original Report Authenticated By: Lollie Marrow, M.D.      1. Foot pain       MDM  56 yo diabetic female with R heel pain after stepping on a rock.  Film reviewed by myself shows calcaneal spurring.  Started on antibiotics  for possible puncture wound and pmh of diabetes.  She will follow up with pcp tomorrow.  rx for percocet and work note as well.  Return for fever or severe pain.          Remi Haggard, NP 09/17/12 1941

## 2012-09-17 NOTE — ED Provider Notes (Signed)
Medical screening examination/treatment/procedure(s) were performed by non-physician practitioner and as supervising physician I was immediately available for consultation/collaboration.  Ladawn Boullion T Darryn Kydd, MD 09/17/12 2224 

## 2012-09-21 ENCOUNTER — Observation Stay (HOSPITAL_COMMUNITY): Payer: Medicaid Other

## 2012-09-21 ENCOUNTER — Encounter (HOSPITAL_COMMUNITY): Payer: Self-pay | Admitting: Cardiology

## 2012-09-21 ENCOUNTER — Inpatient Hospital Stay (HOSPITAL_COMMUNITY)
Admission: EM | Admit: 2012-09-21 | Discharge: 2012-09-30 | DRG: 629 | Disposition: A | Discharge status: Home / Self Care | Payer: Medicaid Other | Attending: Internal Medicine | Admitting: Internal Medicine

## 2012-09-21 DIAGNOSIS — Z885 Allergy status to narcotic agent status: Secondary | ICD-10-CM

## 2012-09-21 DIAGNOSIS — Z91199 Patient's noncompliance with other medical treatment and regimen due to unspecified reason: Secondary | ICD-10-CM

## 2012-09-21 DIAGNOSIS — Z9119 Patient's noncompliance with other medical treatment and regimen: Secondary | ICD-10-CM

## 2012-09-21 DIAGNOSIS — F17200 Nicotine dependence, unspecified, uncomplicated: Secondary | ICD-10-CM | POA: Diagnosis present

## 2012-09-21 DIAGNOSIS — D72829 Elevated white blood cell count, unspecified: Secondary | ICD-10-CM | POA: Insufficient documentation

## 2012-09-21 DIAGNOSIS — E1122 Type 2 diabetes mellitus with diabetic chronic kidney disease: Secondary | ICD-10-CM | POA: Insufficient documentation

## 2012-09-21 DIAGNOSIS — E7800 Pure hypercholesterolemia, unspecified: Secondary | ICD-10-CM | POA: Diagnosis present

## 2012-09-21 DIAGNOSIS — A4902 Methicillin resistant Staphylococcus aureus infection, unspecified site: Secondary | ICD-10-CM | POA: Diagnosis present

## 2012-09-21 DIAGNOSIS — IMO0002 Reserved for concepts with insufficient information to code with codable children: Principal | ICD-10-CM | POA: Diagnosis present

## 2012-09-21 DIAGNOSIS — I059 Rheumatic mitral valve disease, unspecified: Secondary | ICD-10-CM | POA: Diagnosis present

## 2012-09-21 DIAGNOSIS — L02619 Cutaneous abscess of unspecified foot: Secondary | ICD-10-CM | POA: Diagnosis present

## 2012-09-21 DIAGNOSIS — L03115 Cellulitis of right lower limb: Secondary | ICD-10-CM | POA: Insufficient documentation

## 2012-09-21 DIAGNOSIS — E119 Type 2 diabetes mellitus without complications: Secondary | ICD-10-CM

## 2012-09-21 DIAGNOSIS — L0231 Cutaneous abscess of buttock: Secondary | ICD-10-CM | POA: Diagnosis present

## 2012-09-21 DIAGNOSIS — E1169 Type 2 diabetes mellitus with other specified complication: Principal | ICD-10-CM | POA: Diagnosis present

## 2012-09-21 DIAGNOSIS — I10 Essential (primary) hypertension: Secondary | ICD-10-CM | POA: Insufficient documentation

## 2012-09-21 LAB — DIFFERENTIAL
Band Neutrophils: 0 % (ref 0–10)
Blasts: 0 %
Lymphocytes Relative: 7 % — ABNORMAL LOW (ref 12–46)
Metamyelocytes Relative: 0 %
Promyelocytes Absolute: 0 %
nRBC: 0 /100 WBC

## 2012-09-21 LAB — BASIC METABOLIC PANEL
CO2: 24 mEq/L (ref 19–32)
Calcium: 9.4 mg/dL (ref 8.4–10.5)
Chloride: 101 mEq/L (ref 96–112)
Glucose, Bld: 259 mg/dL — ABNORMAL HIGH (ref 70–99)
Sodium: 136 mEq/L (ref 135–145)

## 2012-09-21 LAB — CBC
Hemoglobin: 13.2 g/dL (ref 12.0–15.0)
MCH: 26.3 pg (ref 26.0–34.0)
Platelets: 352 10*3/uL (ref 150–400)
RBC: 5.02 MIL/uL (ref 3.87–5.11)
WBC: 31.9 10*3/uL — ABNORMAL HIGH (ref 4.0–10.5)

## 2012-09-21 MED ORDER — HYDROCHLOROTHIAZIDE 25 MG PO TABS
25.0000 mg | ORAL_TABLET | Freq: Every day | ORAL | Status: DC
  Administered 2012-09-22 – 2012-09-30 (×8): 25 mg via ORAL
  Filled 2012-09-21 (×13): qty 1

## 2012-09-21 MED ORDER — ENOXAPARIN SODIUM 40 MG/0.4ML ~~LOC~~ SOLN
40.0000 mg | SUBCUTANEOUS | Status: DC
  Administered 2012-09-21 – 2012-09-29 (×9): 40 mg via SUBCUTANEOUS
  Filled 2012-09-21 (×11): qty 0.4

## 2012-09-21 MED ORDER — ONDANSETRON HCL 4 MG PO TABS: 4.0000 mg | ORAL_TABLET | Freq: Four times a day (QID) | ORAL | Status: DC | PRN

## 2012-09-21 MED ORDER — ACETAMINOPHEN 325 MG PO TABS
650.0000 mg | ORAL_TABLET | Freq: Four times a day (QID) | ORAL | Status: DC | PRN
  Administered 2012-09-24 – 2012-09-26 (×3): 650 mg via ORAL
  Filled 2012-09-21 (×3): qty 2

## 2012-09-21 MED ORDER — SODIUM CHLORIDE 0.9 % IV SOLN
INTRAVENOUS | Status: DC
  Administered 2012-09-21 – 2012-09-30 (×12): via INTRAVENOUS

## 2012-09-21 MED ORDER — INSULIN ASPART 100 UNIT/ML ~~LOC~~ SOLN
0.0000 [IU] | SUBCUTANEOUS | Status: DC
  Administered 2012-09-21: 5 [IU] via SUBCUTANEOUS

## 2012-09-21 MED ORDER — ONDANSETRON HCL 4 MG/2ML IJ SOLN: 4.0000 mg | Freq: Four times a day (QID) | INTRAMUSCULAR | Status: DC | PRN

## 2012-09-21 MED ORDER — SODIUM CHLORIDE 0.9 % IV SOLN
1000.0000 mL | Freq: Once | INTRAVENOUS | Status: DC
  Administered 2012-09-21: 1000 mL via INTRAVENOUS

## 2012-09-21 MED ORDER — VANCOMYCIN HCL 1000 MG IV SOLR
750.0000 mg | Freq: Two times a day (BID) | INTRAVENOUS | Status: DC
  Administered 2012-09-22 – 2012-09-24 (×6): 750 mg via INTRAVENOUS
  Filled 2012-09-21 (×8): qty 750

## 2012-09-21 MED ORDER — ACETAMINOPHEN 650 MG RE SUPP
650.0000 mg | Freq: Four times a day (QID) | RECTAL | Status: DC | PRN
  Administered 2012-09-24: 650 mg via RECTAL
  Filled 2012-09-21: qty 1

## 2012-09-21 MED ORDER — INSULIN ASPART 100 UNIT/ML ~~LOC~~ SOLN
0.0000 [IU] | Freq: Three times a day (TID) | SUBCUTANEOUS | Status: DC
  Administered 2012-09-22: 3 [IU] via SUBCUTANEOUS
  Administered 2012-09-22: 5 [IU] via SUBCUTANEOUS
  Administered 2012-09-23: 3 [IU] via SUBCUTANEOUS
  Administered 2012-09-23: 5 [IU] via SUBCUTANEOUS
  Administered 2012-09-23 (×2): 3 [IU] via SUBCUTANEOUS
  Administered 2012-09-24: 2 [IU] via SUBCUTANEOUS
  Administered 2012-09-24: 5 [IU] via SUBCUTANEOUS
  Administered 2012-09-24: 7 [IU] via SUBCUTANEOUS
  Administered 2012-09-24: 2 [IU] via SUBCUTANEOUS
  Administered 2012-09-25 (×2): 3 [IU] via SUBCUTANEOUS
  Administered 2012-09-25: 5 [IU] via SUBCUTANEOUS
  Administered 2012-09-25: 7 [IU] via SUBCUTANEOUS
  Administered 2012-09-26: 5 [IU] via SUBCUTANEOUS
  Administered 2012-09-26: 7 [IU] via SUBCUTANEOUS
  Administered 2012-09-26: 1 [IU] via SUBCUTANEOUS
  Administered 2012-09-26: 7 [IU] via SUBCUTANEOUS
  Administered 2012-09-27: 2 [IU] via SUBCUTANEOUS
  Administered 2012-09-27: 3 [IU] via SUBCUTANEOUS
  Administered 2012-09-27: 7 [IU] via SUBCUTANEOUS
  Administered 2012-09-27: 5 [IU] via SUBCUTANEOUS
  Administered 2012-09-28: 7 [IU] via SUBCUTANEOUS
  Administered 2012-09-28: 5 [IU] via SUBCUTANEOUS

## 2012-09-21 MED ORDER — KETOROLAC TROMETHAMINE 30 MG/ML IJ SOLN: 30.0000 mg | Freq: Once | INTRAMUSCULAR | Status: DC

## 2012-09-21 MED ORDER — SODIUM CHLORIDE 0.9 % IV SOLN
1000.0000 mL | INTRAVENOUS | Status: DC
  Administered 2012-09-21: 1000 mL via INTRAVENOUS

## 2012-09-21 MED ORDER — CLINDAMYCIN PHOSPHATE 900 MG/50ML IV SOLN
900.0000 mg | Freq: Three times a day (TID) | INTRAVENOUS | Status: DC
  Administered 2012-09-21: 900 mg via INTRAVENOUS
  Filled 2012-09-21 (×3): qty 50

## 2012-09-21 MED ORDER — OXYCODONE-ACETAMINOPHEN 5-325 MG PO TABS
1.0000 | ORAL_TABLET | ORAL | Status: DC | PRN
  Administered 2012-09-21 – 2012-09-25 (×7): 1 via ORAL
  Filled 2012-09-21 (×10): qty 1

## 2012-09-21 MED ORDER — NICOTINE 14 MG/24HR TD PT24
14.0000 mg | MEDICATED_PATCH | Freq: Every day | TRANSDERMAL | Status: DC
  Filled 2012-09-21 (×10): qty 1

## 2012-09-21 MED ORDER — HYDROMORPHONE HCL PF 1 MG/ML IJ SOLN
1.0000 mg | INTRAMUSCULAR | Status: DC | PRN
  Administered 2012-09-21 – 2012-09-22 (×9): 1 mg via INTRAVENOUS
  Filled 2012-09-21 (×8): qty 1

## 2012-09-21 MED ORDER — VANCOMYCIN HCL 1000 MG IV SOLR
750.0000 mg | Freq: Once | INTRAVENOUS | Status: AC
  Administered 2012-09-21: 750 mg via INTRAVENOUS
  Filled 2012-09-21: qty 750

## 2012-09-21 MED ORDER — ALUM & MAG HYDROXIDE-SIMETH 200-200-20 MG/5ML PO SUSP: 30.0000 mL | Freq: Four times a day (QID) | ORAL | Status: DC | PRN

## 2012-09-21 NOTE — ED Provider Notes (Signed)
Linda Miller is a 56 y.o. female who presents with complaint of a right foot pain. Pt states she stepped on a sharp rock 1 week ago. Was seen here 5 days ago, had negative x-ray, started on antibiotics which she did not fill. States pain in the foot worsening, it is swelling, and pain extending all the way to the knee. Pt denies fever, chills. States does have generalized malaise.  Pt placed in CDU pending blood work and cellulitis protocol.   Filed Vitals:   09/21/12 1210  BP: 110/86  Pulse: 100  Temp: 98.4 F (36.9 C)  Resp: 20   Exam: Pt in NAD. AAOx3. PERRLA. Neck is supple. Regular HR and rhythm. Lungs are clear to auscultation bilaterally. Swelling with erythema noted over right planter surface of the heel, extending over entire heel, ankle joint. No swelling, however, tenderness extending over the calf. Pain with any ROM of the ankle joint. There is some induration and fluctuance over the heel, possible abscess?   1:49 PM  Monitoring for labs, and soft tissue US to r/o abscess.  2:40 PM Results for orders placed during the hospital encounter of 09/21/12  CBC      Component Value Range   WBC 31.9 (*) 4.0 - 10.5 K/uL   RBC 5.02  3.87 - 5.11 MIL/uL   Hemoglobin 13.2  12.0 - 15.0 g/dL   HCT 16.1  09.6 - 04.5 %   MCV 77.9 (*) 78.0 - 100.0 fL   MCH 26.3  26.0 - 34.0 pg   MCHC 33.8  30.0 - 36.0 g/dL   RDW 40.9  81.1 - 91.4 %   Platelets 352  150 - 400 K/uL  BASIC METABOLIC PANEL      Component Value Range   Sodium 136  135 - 145 mEq/L   Potassium 4.0  3.5 - 5.1 mEq/L   Chloride 101  96 - 112 mEq/L   CO2 24  19 - 32 mEq/L   Glucose, Bld 259 (*) 70 - 99 mg/dL   BUN 6  6 - 23 mg/dL   Creatinine, Ser 7.82  0.50 - 1.10 mg/dL   Calcium 9.4  8.4 - 95.6 mg/dL   GFR calc non Af Amer >90  >90 mL/min   GFR calc Af Amer >90  >90 mL/min   [place] Extrem Low Right Ltd  09/21/2012  *RADIOLOGY REPORT*  Clinical Data: Heel pain after stepping on a rock.  Swelling.  ULTRASOUND RIGHT LOWER  EXTREMITY LIMITED  Technique:  Ultrasound examination of the region of interest in the right lower extremity was performed.  Comparison:  None.  Findings: The area of injury is on the plantar aspect of the forehead (heel). No echogenic foreign body is seen.  There is no visible abscess.  Sonographic interrogation extended upward to the medial ankle where there is no discrete fluid collection.  IMPRESSION: No discrete abscess or focal fluid collection is seen.  Diffuse soft tissue swelling is noted.   Original Report Authenticated By: Elsie Stain, M.D.    Dg Foot Complete Right  09/16/2012  *RADIOLOGY REPORT*  Clinical Data: Heel pain and swelling, slipped on something in driveway  RIGHT FOOT COMPLETE - 3+ VIEW  Comparison: None  Findings: Bones are questionably demineralized. Joint spaces preserved. Small plantar calcaneal spur. No acute fracture, dislocation or bone destruction.  IMPRESSION: Calcaneal spurring. No acute abnormalities.   Original Report Authenticated By: Lollie Marrow, M.D.     WBC 31.9, [place] negative for an  abscess. Pt afebrile, non toxic. She is however diabetic, and due to extend of cellulitis and leukocytosis, will admit for IV antibiotics. She is receiving clindamycin.  Spoke with [facility]. Will admit.     Lottie Mussel, PA 09/21/12 1441

## 2012-09-21 NOTE — ED Notes (Signed)
Pt reports that she stepped on a rock on Thursday and was seen here for that. Given antibiotics but was unable to get them filled. Pt with swelling to the right foot and reports increased pain. Pulses palpated. Discolored area to the heel.

## 2012-09-21 NOTE — H&P (Signed)
Hospital Admission Note Date: 09/21/2012  Patient name: Linda Miller Medical record number: [id number] Date of birth: [birth year] Age: 56 y.o. Gender: female PCP: Pcp Not In System  Medical Service:  Attending physician: Dr Doneen Poisson     1st Contact:  Dr Dow Adolph   Pager: [phone] 2nd Contact:  Dr Elberta Fortis  Pager: [phone] After 5 pm or weekends: 1st Contact:      Pager: [phone] 2nd Contact:      Pager: [phone]  Chief Complaint: Pain in the right leg for 55 days  History of Present Illness: [name] is 56 year old woman with past medical history of diabetes and hypertension and hyperlipidemia, who presents with history of pain in the right foot for the last 5 days. The patient reports that she stepped on a rock about 7 days ago. The following day, she started experiencing pain in the right foot. She presented to the ED, and she was evaluated and prescribed antibiotics, and pain medications, which she did not refill due cost. The pain has progressively worsened and she reports experiencing some history of fevers and chills. The pain is constant, and 10 out of 10. There is a history of swelling of the right. She has progressively found it difficult to walk because of pain. She also reports nausea, but no history of vomiting. This is the first, time she's experiencing foot infection.  Recently, she's been without any medical care after [facility] was closed where she was seeing her regular doctor. She is currently taking metronidazole for vagina infection that was diagnosed on October 17.  She reports compliance with her usual medications for diabetes, and hypertension. Her blood pressure is usually around 130/60. Her blood sugars are within the ranges of 106 at 180. She denies any history of heart problems or stroke.  She is currently separated and she does not have any children. She works in [employer]. She does not have health insurance at the moment.  She reports  smoking about one cigarette per day. She does not take alcohol or use of any illegal drugs.    Meds: Current Outpatient Rx  Name Route Sig Dispense Refill  . GLIMEPIRIDE 4 MG PO TABS Oral Take 4 mg by mouth daily before breakfast.    . HYDROCHLOROTHIAZIDE 25 MG PO TABS Oral Take 25 mg by mouth daily.    Marland Kitchen METFORMIN HCL 1000 MG PO TABS Oral Take 1 tablet (1,000 mg total) by mouth 2 (two) times daily. 30 tablet 0    Allergies: Allergies as of 09/21/2012 - Review Complete 09/21/2012  Allergen Reaction Noted  . Codeine Other (See Comments) 09/28/2011  . Tramadol Hives and Itching 09/28/2011   Past Medical History  Diagnosis Date  . Diabetes mellitus   . Hypertension   . High cholesterol    Past Surgical History  Procedure Date  . Abdominal hysterectomy    History reviewed. No pertinent family history. History   Social History  . Marital Status: Legally Separated    Spouse Name: N/A    Number of Children: N/A  . Years of Education: N/A   Occupational History  . Not on file.   Social History Main Topics  . Smoking status: Current Every Day Smoker  . Smokeless tobacco: Not on file  . Alcohol Use: Yes  . Drug Use: No  . Sexually Active:    Other Topics Concern  . Not on file   Social History Narrative  . No narrative on file  Review of Systems: Constitutional: negative for anorexia, night sweats and weight loss Respiratory: negative for chronic bronchitis, cough, pleurisy/chest pain and wheezing Cardiovascular: negative for chest pain, claudication, dyspnea, irregular heart beat, orthopnea, palpitations, paroxysmal nocturnal dyspnea, syncope and tachypnea Gastrointestinal: negative for abdominal pain, constipation, diarrhea and dyspepsia Genitourinary:negative Musculoskeletal:negative for arthralgias, back pain and bone pain Neurological: negative for dizziness, gait problems, seizures, speech problems and tremors Behavioral/Psych: negative  Physical  Exam: Blood pressure 127/80, pulse 99, temperature 98.4 F (36.9 C), temperature source Oral, resp. rate 18, SpO2 97.00%. General appearance: alert, cooperative, severe distress and she does not look toxic Head: Normocephalic, without obvious abnormality, atraumatic Eyes: conjunctivae/corneas clear. PERRL, EOM's intact. Fundi benign. Ears: normal TM's and external ear canals both ears Throat: lips, mucosa, and tongue normal; teeth and gums normal Neck: no adenopathy, no carotid bruit, no JVD, supple, symmetrical, trachea midline and thyroid not enlarged, symmetric, no tenderness/mass/nodules Lungs: clear to auscultation bilaterally and normal percussion bilaterally Heart: regular rate and rhythm, S1, S2 normal, no murmur, click, rub or gallop Abdomen: soft, non-tender; bowel sounds normal; no masses,  no organomegaly Extremities: extremities normal, atraumatic, no cyanosis or edema and she has an area of inflammation with erythema involving the posterior third of the plantar aspect of the right foot. One particular area around the heel, and the plantar aspect and measuring about 5 cm in diameter has features suggestive of pus accumulation. She also has an area of erythema on the medial malleolus. The area is very tender to palpation. The forefoot does not show any signs of infection, like erythema, or tenderness or swelling. Inguinal lymph nodes are nonpalpable. The examination of the cuff, is unremarkable for any areas of tenderness. Skin: Skin color, texture, turgor normal. No rashes or lesions Neurologic: Alert and oriented X 3, normal strength and tone. Normal symmetric reflexes. Normal coordination and gait  Lab results: Basic Metabolic Panel:  Basename 09/21/12 1234  NA 136  K 4.0  CL 101  CO2 24  GLUCOSE 259*  BUN 6  CREATININE 0.60  CALCIUM 9.4  MG --  PHOS --    CBC:  Basename 09/21/12 1234  WBC 31.9*  NEUTROABS 27.8*  HGB 13.2  HCT 39.1  MCV 77.9*  PLT 352   Imaging  results:  [place] Extrem Low Right Ltd  09/21/2012  *RADIOLOGY REPORT*  Clinical Data: Heel pain after stepping on a rock.  Swelling.  ULTRASOUND RIGHT LOWER EXTREMITY LIMITED  Technique:  Ultrasound examination of the region of interest in the right lower extremity was performed.  Comparison:  None.  Findings: The area of injury is on the plantar aspect of the forehead (heel). No echogenic foreign body is seen.  There is no visible abscess.  Sonographic interrogation extended upward to the medial ankle where there is no discrete fluid collection.  IMPRESSION: No discrete abscess or focal fluid collection is seen.  Diffuse soft tissue swelling is noted.   Original Report Authenticated By: Elsie Stain, M.D.     Assessment & Plan by Problem: 56 year old woman, with past medical history of diabetes, hypertension, and hyperlipidemia, who presents with fevers and chills together with right foot pain for one week.  Right foot cellulitis. Patient reports history of trauma when she stepped on a rock with her bare foot two days prior to onset of pain. She was seen in the ED, and prescribed antibiotics, that were not refill due to the cost. Examination of the foot shows cellulitis involving the plantar aspect of  her hindfoot. There is a particular area that is very clinically concerning for pus accumulation. However, ultrasound of the foot which was done today in the ED did not show pus accumulation. White blood cell count is elevated at 31.9, with a high neutrophil count 87%/absolute 27.8. This patient has risk factors for foot infection, specifically diabetes. Plan -Patient been started on clindamycin intravenously. -Will change his medication to vancomycin to cover Gram positives. Particularly in this diabetic patient consider covering for streptococci, MRSA, aerobic gram-negative bacilli, and anaerobes. Addition of Zosyn or imipenen -Clinically signs suggestive of of pus accumulation. Will consult general  surgery for ? I&D which I believe can be performed at bedside. -Will monitor CBC for leukocytosis. -Examination does not suggest osteomyelitis. However, given this patient with diabetes she has significant risk factors for this and xray of the foot will be considered. -Pain medication -Elevation of the right lower extremity  Diabetes: The patient's home medications for diabetes include glimepiride and metformin. There is no record of her previous HbA1c's. Blood sugars today in the ED, where elevated at 259. I believe this elevation in her blood sugar could be attributable to the infection she currently has. Plan -switch to Insulin therapy with SSI -Monitor blood sugars. -Will do HbA1c  Hypertension: Patient's home medications include hydrochlorothiazide. On admission her blood pressure was 110/86. She reports compliance with this medication. Plan -Will continue with home medications.  Signed: Dow Adolph 09/21/2012, 5:19 PM

## 2012-09-21 NOTE — ED Provider Notes (Signed)
History     CSN: [id number]  Arrival date & time 09/21/12  1145   First MD Initiated Contact with Patient 09/21/12 1235      Chief Complaint  Patient presents with  . Foot Pain     HPI The patient presents several days after a recent ED visit (for foot pain that began after stepping on a rock) now w increasing pain and new edema / discoloration.  The pain is focally about the heel, radiating proximally.  The pain is sharp / burning / worse w motion and weight bearing.  The patient has not obtained her prescribed ABX (given prophylactically w documented concern for puncture wound and Hx of DM).  She also has not filled her analgesic prescription. No new f/c, n/v/d, cp/dyspnea, abd pain. She states that she is compliant with her other medications.  Past Medical History  Diagnosis Date  . Diabetes mellitus   . Hypertension   . High cholesterol     Past Surgical History  Procedure Date  . Abdominal hysterectomy     History reviewed. No pertinent family history.  History  Substance Use Topics  . Smoking status: Current Every Day Smoker  . Smokeless tobacco: Not on file  . Alcohol Use: Yes    OB History    Grav Para Term Preterm Abortions TAB SAB Ect Mult Living                  Review of Systems  Allergies  Codeine and Tramadol  Home Medications   Current Outpatient Rx  Name Route Sig Dispense Refill  . GLIMEPIRIDE 4 MG PO TABS Oral Take 4 mg by mouth daily before breakfast.    . HYDROCHLOROTHIAZIDE 25 MG PO TABS Oral Take 25 mg by mouth daily.    Marland Kitchen METFORMIN HCL 1000 MG PO TABS Oral Take 1 tablet (1,000 mg total) by mouth 2 (two) times daily. 30 tablet 0    BP 110/86  Pulse 100  Temp 98.4 F (36.9 C) (Oral)  Resp 20  SpO2 100%  Physical Exam  Nursing note and vitals reviewed. Constitutional: She is oriented to person, place, and time. She appears well-developed and well-nourished. No distress.  HENT:  Head: Normocephalic and atraumatic.  Eyes:  Conjunctivae normal and EOM are normal.  Cardiovascular: Normal rate, regular rhythm and intact distal pulses.   Pulmonary/Chest: Effort normal. No stridor. No respiratory distress.  Musculoskeletal: She exhibits no edema.       Feet:  Neurological: She is alert and oriented to person, place, and time. No cranial nerve deficit.  Skin: Skin is warm and dry.  Psychiatric: Judgment normal. Her mood appears anxious. Cognition and memory are normal.    ED Course  Procedures (including critical care time)   Labs Reviewed  CBC  BASIC METABOLIC PANEL   No results found.   No diagnosis found.    MDM  Patient now p/w worsening pain and edema in her R heel.  Given the recent puncture wound and these new developments (and her lack of medication compliance) there is concern for infection (cellulitis vs. Abscess (though there is no fluctuance or drainage)).  With the patient's need for continued management, she was placed in the CDU under cellulitis protocol.  I discussed the case with the MLP.       Gerhard Munch, MD 09/21/12 1244

## 2012-09-21 NOTE — ED Provider Notes (Signed)
I transferred the patient to the CDU from fast track for completion of her care, and initiation of IV antibiotics.  Given her leukocytosis, her comorbidities, though she was in no distress, and afebrile with no evidence of an abscess on ultrasound, she was admitted for further evaluation and management.  Gerhard Munch, MD 09/21/12 1630

## 2012-09-21 NOTE — Progress Notes (Signed)
ANTIBIOTIC CONSULT NOTE - INITIAL  Pharmacy Consult for vancomycin Indication: cellulitis  Allergies  Allergen Reactions  . Codeine Other (See Comments)    Throat Swelling and itching  . Tramadol Hives and Itching    Patient Measurements:   Stated height/weight = 5'3"/140lb  Vital Signs: Temp: 98.4 F (36.9 C) (10/28 1432) Temp src: Oral (10/28 1432) BP: 131/79 mmHg (10/28 1432) Pulse Rate: 94  (10/28 1432) Intake/Output from previous day:   Intake/Output from this shift:    Labs:  Basename 09/21/12 1234  WBC 31.9*  HGB 13.2  PLT 352  LABCREA --  CREATININE 0.60   CrCl is unknown because there is no height on file for the current visit. No results found for this basename: VANCOTROUGH:2,VANCOPEAK:2,VANCORANDOM:2,GENTTROUGH:2,GENTPEAK:2,GENTRANDOM:2,TOBRATROUGH:2,TOBRAPEAK:2,TOBRARND:2,AMIKACINPEAK:2,AMIKACINTROU:2,AMIKACIN:2, in the last 72 hours   Microbiology: Recent Results (from the past 720 hour(s))  WET PREP, GENITAL     Status: Abnormal   Collection Time   09/07/12  4:09 PM      Component Value Range Status Comment   Yeast Wet Prep HPF POC NONE SEEN  NONE SEEN Final    Trich, Wet Prep NONE SEEN  NONE SEEN Final    Clue Cells Wet Prep HPF POC FEW (*) NONE SEEN Final    WBC, Wet Prep HPF POC FEW (*) NONE SEEN Final     Medical History: Past Medical History  Diagnosis Date  . Diabetes mellitus   . Hypertension   . High cholesterol     Medications:   (Not in a hospital admission) Assessment: 67 yof presented to the ED with foot pain. She had recently been prescribed abx for this which she never picked up outpatient. She is currently afebrile but WBC is significantly elevated at 31.9. MD has started clindamycin and she was also be started on empiric vancomycin.   Goal of Therapy:  Vancomycin trough level 10-15 mcg/ml  Plan:  1. Vancomycin 750mg  IV Q12H 2. F/u renal fxn, C&S, clinical status and trough at [facility]  Shantasia Hunnell, Drake Leach 09/21/2012,4:25 PM

## 2012-09-22 DIAGNOSIS — I10 Essential (primary) hypertension: Secondary | ICD-10-CM | POA: Insufficient documentation

## 2012-09-22 DIAGNOSIS — D72829 Elevated white blood cell count, unspecified: Secondary | ICD-10-CM | POA: Insufficient documentation

## 2012-09-22 DIAGNOSIS — E1122 Type 2 diabetes mellitus with diabetic chronic kidney disease: Secondary | ICD-10-CM | POA: Insufficient documentation

## 2012-09-22 DIAGNOSIS — L03115 Cellulitis of right lower limb: Secondary | ICD-10-CM | POA: Insufficient documentation

## 2012-09-22 LAB — URINALYSIS, ROUTINE W REFLEX MICROSCOPIC
Bilirubin Urine: NEGATIVE
Hgb urine dipstick: NEGATIVE
Nitrite: NEGATIVE
Specific Gravity, Urine: 1.018 (ref 1.005–1.030)
Urobilinogen, UA: 0.2 mg/dL (ref 0.0–1.0)
pH: 5 (ref 5.0–8.0)

## 2012-09-22 LAB — COMPREHENSIVE METABOLIC PANEL
ALT: 5 U/L (ref 0–35)
AST: 7 U/L (ref 0–37)
Alkaline Phosphatase: 75 U/L (ref 39–117)
CO2: 22 mEq/L (ref 19–32)
Chloride: 102 mEq/L (ref 96–112)
GFR calc non Af Amer: >90 mL/min (ref >90)
Glucose, Bld: 333 mg/dL — ABNORMAL HIGH (ref 70–99)
Potassium: 3.4 mEq/L — ABNORMAL LOW (ref 3.5–5.1)
Sodium: 135 mEq/L (ref 135–145)
Total Bilirubin: 0.2 mg/dL — ABNORMAL LOW (ref 0.3–1.2)

## 2012-09-22 LAB — GLUCOSE, CAPILLARY
Glucose-Capillary: 284 mg/dL — ABNORMAL HIGH (ref 70–99)
Glucose-Capillary: 259 mg/dL — ABNORMAL HIGH (ref 70–99)
Glucose-Capillary: 115 mg/dL — ABNORMAL HIGH (ref 70–99)

## 2012-09-22 LAB — CBC
Hemoglobin: 11.9 g/dL — ABNORMAL LOW (ref 12.0–15.0)
MCH: 25.6 pg — ABNORMAL LOW (ref 26.0–34.0)
MCHC: 33.1 g/dL (ref 30.0–36.0)
Platelets: 362 10*3/uL (ref 150–400)
RDW: 13.6 % (ref 11.5–15.5)

## 2012-09-22 LAB — URINE MICROSCOPIC-ADD ON

## 2012-09-22 LAB — MRSA PCR SCREENING: MRSA by PCR: NEGATIVE

## 2012-09-22 LAB — HEMOGLOBIN A1C: Hgb A1c MFr Bld: 15.6 % — ABNORMAL HIGH (ref <5.7)

## 2012-09-22 MED ORDER — SODIUM CHLORIDE 0.9 % IV SOLN
3.0000 g | Freq: Four times a day (QID) | INTRAVENOUS | Status: DC
  Administered 2012-09-22 – 2012-09-24 (×7): 3 g via INTRAVENOUS
  Filled 2012-09-22 (×12): qty 3

## 2012-09-22 MED ORDER — TETANUS TOXOID ADSORBED 5 LFU IM SOLN
0.5000 mL | Freq: Once | INTRAMUSCULAR | Status: DC
  Filled 2012-09-22: qty 0.5

## 2012-09-22 MED ORDER — INFLUENZA VIRUS VACC SPLIT PF IM SUSP
0.5000 mL | INTRAMUSCULAR | Status: AC
  Administered 2012-09-23: 0.5 mL via INTRAMUSCULAR
  Filled 2012-09-22: qty 0.5

## 2012-09-22 MED ORDER — HYDROMORPHONE HCL PF 1 MG/ML IJ SOLN
2.0000 mg | INTRAMUSCULAR | Status: DC | PRN
  Administered 2012-09-22 – 2012-09-23 (×6): 2 mg via INTRAVENOUS
  Filled 2012-09-22 (×7): qty 2

## 2012-09-22 MED ORDER — TETANUS-DIPHTH-ACELL PERTUSSIS 5-2.5-18.5 LF-MCG/0.5 IM SUSP
0.5000 mL | Freq: Once | INTRAMUSCULAR | Status: AC
  Administered 2012-09-22: 0.5 mL via INTRAMUSCULAR
  Filled 2012-09-22: qty 0.5

## 2012-09-22 MED ORDER — INSULIN ASPART PROT & ASPART (70-30 MIX) 100 UNIT/ML ~~LOC~~ SUSP
10.0000 [IU] | Freq: Two times a day (BID) | SUBCUTANEOUS | Status: DC
  Administered 2012-09-22 – 2012-09-25 (×5): 10 [IU] via SUBCUTANEOUS
  Filled 2012-09-22: qty 3

## 2012-09-22 MED ORDER — PNEUMOCOCCAL VAC POLYVALENT 25 MCG/0.5ML IJ INJ
0.5000 mL | INJECTION | INTRAMUSCULAR | Status: AC
  Administered 2012-09-23: 0.5 mL via INTRAMUSCULAR
  Filled 2012-09-22: qty 0.5

## 2012-09-22 NOTE — H&P (Signed)
Internal Medicine Attending Admission Note Date: 09/22/2012  Patient name: Linda Miller Medical record number: [id number] Date of birth: 1956/09/03 Age: 56 y.o. Gender: female  I saw and evaluated the patient. I reviewed the resident's note and I agree with the resident's findings and plan as documented in the resident's note.  Chief Complaint(s): Right foot pain.  History - key components related to admission:  Linda Miller is a 56 year old woman with a history of diabetes, hypertension, hyperlipidemia, and MRSA abscesses of the skin who presents with a one-week history of severe right heel pain. She is a former patient of [facility] and has been unable to get refills on her diabetic medications for the last 3 months. This has resulted in polyuria and polydipsia and a significant weight loss. Soon after this, she developed at least 3 right upper extremity skin abscesses which she picked at and which drain spontaneously. Approximately 2 weeks ago, she developed what sounds like skin abscesses of the labia majora. These were I&D in the emergency department and she was started on metronidazole. Approximately one week ago she was walking on her gravel driveway when she stepped on a rock that hurt her right heel. She subsequently developed worsening right heel pain which extended to the more distal plantar surface. She was seen in the emergency department and was started on Cipro and pain medicines. Unfortunately, she was unable to afford these medications. Her right foot pain worsened over the subsequent days and she reported to the emergency department and is admitted to the internal medicine teaching service for further evaluation and care. The only other significant historical event recently was the development of a left buttock skin abscess approximately 2 days ago which spontaneously drained. She denies any fevers, shakes, or chills. She also denies any shortness of breath, chest pain, or pleuritic  pain. She's had no dysuria or other obvious sites of infection other than those mentioned above. She is without other complaints.  Physical Exam - key components related to admission:  Filed Vitals:   09/21/12 1740 09/21/12 1748 09/21/12 2148 09/22/12 0617  BP: 101/67  128/65 132/65  Pulse: 106  102 102  Temp: 98.1 F (36.7 C)  98.1 F (36.7 C) 98.2 F (36.8 C)  TempSrc: Oral  Oral Oral  Resp: 18  18 18   Height:  5\' 3"  (1.6 m)    Weight:  140 lb (63.504 kg)    SpO2: 100%  100% 100%   General: Well-developed, well-nourished, woman lying comfortably in bed who appears anxious. She winces in pain anytime her right foot is touched. Lungs: Clear to auscultation bilaterally without wheezes, rhonchi, or rales. Heart: Regular rate and rhythm with a 1/6 holosystolic murmur in the right upper sternal border and a 2/6 holosystolic murmur at the left lower sternal border. There is no radiation of the murmur to the neck or the axilla. Abdomen: Soft, nontender, active bowel sounds without masses. Vulva: Evidence of a firm nodule on the right inner labia majora consistent with a previously drained abscess. Buttocks: 2 cm firm subcutaneous nodule with overlying ulceration and black eschar at the upper medial aspect of the left buttocks. Extremities: Right upper extremity with 3 well-healed scars that are circular and just smaller than a dime.  These were the sites of the previous abscess. The left lower extremity is unremarkable. The right lower extremity reveals significant medial erythema and mild erythema of the right hindfoot. There is tenderness and mild fluctuance at the right heel and erythema moving distally  to the metatarsal joint area. There is tenderness with a foot squeeze maneuver.  The plantar surface of the right heel has some mild fluctuance but there are absolutely no skin breaks. The fingers and toes lack any evidence of splinter hemorrhages.   Lab results:  Basic Metabolic  Panel:  Basename 09/22/12 0700 09/21/12 1234  NA 135 136  K 3.4* 4.0  CL 102 101  CO2 22 24  GLUCOSE 333* 259*  BUN 5* 6  CREATININE 0.51 0.60  CALCIUM 8.5 9.4  MG -- --  PHOS -- --   Liver Function Tests:  Basename 09/22/12 0700  AST 7  ALT 5  ALKPHOS 75  BILITOT 0.2*  PROT 6.4  ALBUMIN 2.5*   CBC:  Basename 09/22/12 0700 09/21/12 1234  WBC 25.9* 31.9*  NEUTROABS -- 27.8*  HGB 11.9* 13.2  HCT 36.0 39.1  MCV 77.6* 77.9*  PLT 362 352   CBG:  Basename 09/22/12 0648 09/21/12 2151 09/21/12 1812  GLUCAP 284* 201* 251*   Hemoglobin A1C:  Basename 09/21/12 1750  HGBA1C 15.6*   Urinalysis:  Pending  Imaging results:  [place] Extrem Low Right Ltd  09/21/2012  *RADIOLOGY REPORT*  Clinical Data: Heel pain after stepping on a rock.  Swelling.  ULTRASOUND RIGHT LOWER EXTREMITY LIMITED  Technique:  Ultrasound examination of the region of interest in the right lower extremity was performed.  Comparison:  None.  Findings: The area of injury is on the plantar aspect of the forehead (heel). No echogenic foreign body is seen.  There is no visible abscess.  Sonographic interrogation extended upward to the medial ankle where there is no discrete fluid collection.  IMPRESSION: No discrete abscess or focal fluid collection is seen.  Diffuse soft tissue swelling is noted.   Original Report Authenticated By: Elsie Stain, M.D.    Other results:  EKG: Pending  Assessment & Plan by Problem:  Linda Miller is a 56 year old woman with a history of poorly controlled diabetes, hypertension, and hyperlipidemia who presents with severe right foot pain that progressed over the past week. Since she's been unable to get her diabetic medications, her sugars have been extraordinarily high. This has resulted in polyuria, polydipsia, and significant weight loss. She's also, during this time, developed numerous skin abscesses concerning for Staphylococcus aureus. Although she attributes her foot pain to  stepping on a rock, there is no evidence of skin break at the foot where she had stepped on a rock. I am more concerned that with these recent abscesses, some of which have been I&D, she may have developed a transient infection hematogenously. This could have resulted in seeding of the infection to the right foot. This would've resulted in an abscess which we sense on examination, as well as an inflammation of the plantar fascia resulting in a plantar fasciitis. Infection is also suggested by her significant leukocytosis. With her cardiac murmurs, which she states have not been present previously, one also needs to be concerned about an endocarditis at this point.  1) Right foot pain: Likely secondary to an infection, specifically an abscess, on the dorsum of the right heel with resultant plantar fasciitis. There is no evidence of any skin break and a hematogenous mechanism may be at play given her recent skin abscesses and I&D's. With her heart murmur and concern for possible endocarditis she requires further cardiac imaging. Unfortunately, she was started on antibiotics before blood cultures were drawn in the emergency department. Therefore, we may be left with empiric  therapy. We will continue the IV vancomycin and add Unasyn to broaden coverage. We will also obtain blood cultures in hopes of isolating the bacteria. A urinalysis will be obtained to assess for evidence of recent infection, again likely hematogenously spread. An transthoracic echocardiogram will be obtained to assess her valves. If we remain concerned for endocarditis, and the transthoracic echo is negative, we may proceed to a transesophageal echocardiogram. We will also obtain an EKG to look for any evidence of conduction system disease. Although it was likely a red herring, she relates the beginning of her troubles was after stepping on a rock, therefore we will cover her with a tetanus booster since it has been greater than 10 years. She will  be changed to inpatient status as I anticipate several days of inpatient care. Obviously, we will also consult orthopedic surgery to assess the right heel given our concerns for an abscess and the need for I&D.  2) Diabetes: Given her A1c of greater than 15 she will be started on basilar 70/30 insulin. This was chosen over Lantus because of concerns for cost.  3) Disposition: Will be determined after the above evaluation is complete and a more specific diagnosis is made which will dictate specific therapy.

## 2012-09-22 NOTE — Care Management Note (Addendum)
    Page 1 of 2   09/30/2012     3:05:26 PM   CARE MANAGEMENT NOTE 09/30/2012  Patient:  Linda Miller, Linda Miller   Account Number:  [id number]  Date Initiated:  09/22/2012  Documentation initiated by:  Linda Miller  Subjective/Objective Assessment:   Admitted with cellulitis right foot.     Action/Plan:   PT eval-recommending HHPT, rolling walker   Anticipated DC Date:  09/30/2012   Anticipated DC Plan:  HOME W HOME HEALTH SERVICES  In-house referral  Clinical Social Worker  Editor, commissioning  CM consult      [facility]  HOME HEALTH   Choice offered to / List presented to:  C-1 Patient        HH arranged  HH-2 PT  HH-1 RN  HH-6 SOCIAL WORKER      HH agency  [facility]   Status of service:  Completed, signed off Medicare Important Message given?   (If response is "NO", the following Medicare IM given date fields will be blank) Date Medicare IM given:   Date Additional Medicare IM given:    Discharge Disposition:  HOME W HOME HEALTH SERVICES  Per UR Regulation:  Reviewed for med. necessity/level of care/duration of stay  If discussed at Long Length of Stay Meetings, dates discussed:    Comments:  09/30/12 15:04 Linda Cape RN, [name] [phone] patient is for dc today and will be going to her mother's address at [address] [place] [address] phone [phone], this information was given to [facility] with [facility].   09/28/12 10;53 Linda Cape RN, [name] [phone] patient  lives alone, she states she is able to learn how to hook her iv abx up and disconnect them to picc line, picc line placement today for home iv abx of vanc for 4 weeks.  Patient is working with [facility] per previous note and I also confirmed this with patient.  [facility] notified of referral for [facility], PT, and CSW.  Soc will begin 24-48 hrs post discharge.   09/24/12 Spoke with patient about HHC for HHPT. Explained that [facility] would work with her since she is uninsured. She is  agreeable with getting a rolling walker.Contacted financial couselor, she had tried to contact tha patient 09/23/12 but patient was having her procedure.She will try to contact patient again. Contacted CSW and she will speak with patient again. Contacted Linda Miller at [facility] and requested HHPT. Will continue to follow for d/c needs. Linda Cree RN, [facility]  09/22/12 Spoke with patient about d/c plans. She was upset about [facility] turning off her Miller today. Made referral to CSW for any possible assistance. Made referral to financial counselor. Gave patient discount pharmacy card. Patient is eligible for indigent fund per pharmacy. Will continue to follow for discharge needs. Linda Cree RN, [name], [name]

## 2012-09-22 NOTE — Progress Notes (Signed)
Inpatient Diabetes Program Recommendations  AACE/ADA: New Consensus Statement on Inpatient Glycemic Control (2013)  Target Ranges:  Prepandial:   less than 140 mg/dL      Peak postprandial:   less than 180 mg/dL (1-2 hours)      Critically ill patients:  140 - 180 mg/dL   Reason for Visit: Hyperglycemia in hospital and elevated Hbg A1C  Inpatient Diabetes Program Recommendations HgbA1C: Result was 15.6  (Indicating glucose of about 401 mg/dl much of time)  Note: Attempted to talk with patient, but she is in a lot of pain in heel.  Has just received pain medication.  States that she has been given a list of potential sources for a PCP after discharge.  As noted, patient was a patient at [facility] until it closed.    Gave patient a handout regarding [employer] generic meter and [employer] insulin.  Note patient starting 70/30.  CBG's mid to upper 200's and she is NPO.  With 70/30 dose low, will probably be OK even though she is not eating.    Note that patient will not be placed on Lantus or Levemir due to cost.  If patient is made NPO again, would benefit from receiving the NPH portion of the 70/30 dose bid -- which would be the 70% portion of the 70/30 dose bid.  Thank you.  Azlee Monforte S. Elsie Lincoln, RN, CNS, CDE pager [phone]

## 2012-09-22 NOTE — Progress Notes (Addendum)
Medical Student Daily Progress Note  Subjective: [name] was seen this morning and examined bedside. Her R foot is extremely painful to touch both on the apparent lesion and in a 3-4 inch border around the visible swelling, as well when she moves it. She also reports that she has a painful lesion on her L buttock, but that it does not prevent her from sitting or laying on in. She says her R leg feels warm. She is concerned about her ability to return to work and pay for medications, She denies N/V/HA/ABD Pain at this time.   Objective: Vital signs in last 24 hours: Filed Vitals:   09/21/12 1740 09/21/12 1748 09/21/12 2148 09/22/12 0617  BP: 101/67  128/65 132/65  Pulse: 106  102 102  Temp: 98.1 F (36.7 C)  98.1 F (36.7 C) 98.2 F (36.8 C)  TempSrc: Oral  Oral Oral  Resp: 18  18 18   Height:  5\' 3"  (1.6 m)    Weight:  63.504 kg (140 lb)    SpO2: 100%  100% 100%   Weight change:   Intake/Output Summary (Last 24 hours) at 09/22/12 1117 Last data filed at 09/21/12 2030  Gross per 24 hour  Intake    120 ml  Output      0 ml  Net    120 ml   Physical Exam: BP 132/65  Pulse 102  Temp 98.2 F (36.8 C) (Oral)  Resp 18  Ht 5\' 3"  (1.6 m)  Wt 63.504 kg (140 lb)  BMI 24.80 kg/m2  SpO2 100%  General Appearance:    Alert, cooperative, appears stated age Positive for: tearful and distressed when discussing condition  Head:    Normocephalic, atraumatic  Eyes:    EOM's intact both eyes  Ears:    Normal external ear canals, both ears  Nose:   Nares normal, septum midline, no drainage tenderness  Throat:   Lips, mucosa, and tongue normal; teeth and gums normal  Neck:   Supple, symmetrical, trachea midline, no adenopathy;    thyroid:  No obvious enlargement  Back:     Symmetric, no curvature, ROM normal  Lungs:     Clear to auscultation bilaterally, respirations unlabored  Chest Wall:    No tenderness or deformity   Heart:    Regular rate and rhythm, Positive for:  S1  normal, S2 soft murmur, soft 'jetting' holosystolic murmur of intensity of 1/6  Abdomen:     Soft, non-tender, no masses  Genitalia:    Normal female Positive for: One 5mm non-purulent nodule on R Lower labia majora Distribution of white discharge on inside of labia minora  Extremities:   L extremity normal, atraumatic, no cyanosis or edema Positive for: R extremity mildly edematous/swollen compared to left, warm to touch and warmer than left. Left foot has red swelling in a 5-6cm distribution around the medial malleolus. Skin on sole in heel area has a demarcated white area of skin ~3cm x 5cm that is swollen and possibly purulent, surrounded by a 1-2cm red border of skin more red than the surrounding plantar skin.  Pulses:   2+ and symmetric all extremities  Skin:   Skin color, texture, turgor normal, no rashes or lesions, with exception of areas noted above in exam.  Neurologic:   AAOx3, normal cognition and affectt   Lab Results: CBC    Component Value Date/Time   WBC 25.9* 09/22/2012 0700   RBC 4.64 09/22/2012 0700   HGB 11.9* 09/22/2012 0700  HCT 36.0 09/22/2012 0700   PLT 362 09/22/2012 0700   MCV 77.6* 09/22/2012 0700   MCH 25.6* 09/22/2012 0700   MCHC 33.1 09/22/2012 0700   RDW 13.6 09/22/2012 0700   LYMPHSABS 2.2 09/21/2012 1234   MONOABS 1.9* 09/21/2012 1234   EOSABS 0.0 09/21/2012 1234   BASOSABS 0.0 09/21/2012 1234      Micro Results: No results found for this or any previous visit (from the past 240 hour(s)). Studies/Results: [place] Extrem Low Right Ltd  09/21/2012  *RADIOLOGY REPORT*  Clinical Data: Heel pain after stepping on a rock.  Swelling.  ULTRASOUND RIGHT LOWER EXTREMITY LIMITED  Technique:  Ultrasound examination of the region of interest in the right lower extremity was performed.  Comparison:  None.  Findings: The area of injury is on the plantar aspect of the forehead (heel). No echogenic foreign body is seen.  There is no visible abscess.  Sonographic  interrogation extended upward to the medial ankle where there is no discrete fluid collection.  IMPRESSION: No discrete abscess or focal fluid collection is seen.  Diffuse soft tissue swelling is noted.   Original Report Authenticated By: Elsie Stain, M.D.    Medications: I have reviewed the patient's current medications. Scheduled Meds:   . enoxaparin (LOVENOX) injection  40 mg Subcutaneous Q24H  . hydrochlorothiazide  25 mg Oral Q breakfast  . insulin aspart  0-9 Units Subcutaneous TID AC & HS  . ketorolac  30 mg Intravenous Once  . nicotine  14 mg Transdermal Daily  . tetanus toxoid adsorbed  0.5 mL Intramuscular Once  . vancomycin  750 mg Intravenous Once  . vancomycin  750 mg Intravenous Q12H  . DISCONTD: sodium chloride  1,000 mL Intravenous Once  . DISCONTD: clindamycin (CLEOCIN) IV  900 mg Intravenous Q8H  . DISCONTD: insulin aspart  0-9 Units Subcutaneous Q4H   Continuous Infusions:   . sodium chloride 150 mL/hr at 09/22/12 0052  . DISCONTD: sodium chloride 1,000 mL (09/21/12 1555)   PRN Meds:.acetaminophen, acetaminophen, alum & mag hydroxide-simeth, HYDROmorphone (DILAUDID) injection, ondansetron (ZOFRAN) IV, ondansetron, oxyCODONE-acetaminophen, DISCONTD:  HYDROmorphone (DILAUDID) injection  Assessment/Plan: [name] is a 56yo female who presented yesterday for R foot pain. Overall she is still in significant pain and unable to walk on her foot. She has an elevated WBC count had other purulent infections recently on her buttocks and labia. Her diabetes is not well controlled, and she has a previously undiagnosed 1/6 systolic heart murmur of unclear etiology.  Cellulitis of right foot Her lesion in the calcaneous area of the plantar aspect of her foot is exquisitely painful and is concerning for plantar fascitis. -consult orthopedics to assess severity of infection and surgical candidacy -oxycodone-acetaminophen (Percocet) 325 mg tablet PO Q4hrs -acetaminophen tablet  650mg  PO Q6hrs -continue NPO in case of need for surgical intervention  Leukocytosis Her WBC dropped from 31.9 on admission to 25.9 today. We are covering for skin and environmental flora that could disseminate -continue vancomysin 750mg  IV Q12hrs -add Ampicillin+Sulbactam (Unasyn) 1500mg  IV Q6hrs  Heart Murmur We are concerned this may be an early sign of bacterial endocarditis -transthoracic cardiac ultrasound to examine for vegetations, abscess, and valve function   Diabetes mellitus, type II She has not taken metformin or other DM since the closure of [facility]. Her A1c of 15.6 and glucose of 333 indicate that her DM is not well controlled. -insulin aspart (Novolog) injection 7 units   Hypertension Her BP has not been significantly elevated or labile  since admission. She has not been treating this since the closure of [facility]. -continue Hydrochlorothiazide 25mg  PO 1xDaily am   LOS: 1 day   This is a Psychologist, occupational Note.  The care of the patient was discussed with Dr. Dow Adolph PGY 1 and the assessment and plan formulated with their assistance.  Please see their attached note for official documentation of the daily encounter.  Ellin Mayhew 09/22/2012, 11:17 AM    4:17 PM Resident Co-sign Daily Note: I have seen the patient and reviewed the daily progress note by Malachi Carl, MS3 and discussed the care of the patient with them.  See below for documentation of my findings, assessment, and plans.  Subjective: She reports severe pain throughout the night. She does not report any new symptoms. Further history reveals that the patient had a recent I &D done on her labia majora which he drained out and she was started on metronidazole. She also has previous history of skin infections with the most recent one involving her left buttock. She did not seek treatment for this infection, but she's just been managing it with cushioning and massaging it with warm water. She is  able to sit on her buttock despite this ulceration and some pain.  She also reports that she is been off medication for diabetes due to cost. Consequently, she's been experiencing polydipsia, and polyuria over the last couple of months since discontinuation of her medications. Objective: Vital signs in last 24 hours: Filed Vitals:   09/21/12 1748 09/21/12 2148 09/22/12 0617 09/22/12 1000  BP:  128/65 132/65 130/68  Pulse:  102 102 100  Temp:  98.1 F (36.7 C) 98.2 F (36.8 C) 97.9 F (36.6 C)  TempSrc:  Oral Oral Oral  Resp:  18 18 18   Height: 5\' 3"  (1.6 m)     Weight: 140 lb (63.504 kg)     SpO2:  100% 100% 100%   Physical Exam General: Well-developed, well-nourished, woman lying comfortably in bed who appears anxious. She is tearful and reports recent difficulties paying her bills and affording her medications. She winces in pain anytime her right foot is touched. She does not have splinter hemorrhages, Janeway lesions, or other stigmata of infective endocarditis. Examination of the skin reveals previous 3 scars from skin abscesses on the right upper extremity. Lungs: Clear to auscultation bilaterally without wheezes, rhonchi, or rales.  Heart: Regular rate and rhythm with a 1/6 holosystolic murmur in the right upper sternal border and a 2/6 holosystolic murmur at the left lower sternal border. There is no radiation of the murmur to the neck or the axilla.  Abdomen: Soft, nontender, active bowel sounds without masses.  Vulva: Evidence of a firm nodule on the right inner labia majora consistent with a previously drained abscess.  Buttocks: 2 cm firm subcutaneous nodule with overlying ulceration and black eschar at the upper medial aspect of the left buttocks.  Extremities:  She has an area of inflammation with erythema involving the posterior third of the plantar aspect of the right foot. One particular area around the heel on the plantar aspect and measuring about 5 cm in diameter has  features suggestive of pus accumulation. She also has an area of erythema on the medial malleolus. The area is very tender to palpation. The forefoot does not show any signs of infection, like erythema, or tenderness or swelling. Inguinal lymph nodes are nonpalpable. The examination of the cuff, is unremarkable for any areas of tenderness.   Continuous  Infusions:   . sodium chloride 50 mL/hr at 09/22/12 1238  . DISCONTD: sodium chloride 1,000 mL (09/21/12 1555)   PRN Meds:.acetaminophen, acetaminophen, alum & mag hydroxide-simeth, HYDROmorphone (DILAUDID) injection, ondansetron (ZOFRAN) IV, ondansetron, oxyCODONE-acetaminophen, DISCONTD:  HYDROmorphone (DILAUDID) injection Assessment/Plan: 56 year old woman, with past medical history of diabetes, hypertension, and hyperlipidemia, who presents with fevers and chills together with right foot pain for one week  Right foot cellulitis with possible disseminated infection. She reports that her foot pain has continued and and she also thinks that he has worsened overnight. Examination of the foot shows cellulitis involving the plantar aspect of her hindfoot. There is a particular area that is very clinically concerning for pus accumulation. With squeezing, manuevers of the foot, producing some pain involving the entire plantar aspect of the fourth. This is very concerning for fasciitis. There are no signs to show compromised neurovascular supply of the forefoot. In this patient with uncontrolled, type 2 diabetes, and history of recurrent skin infections with the most vaginal abscess that required I&D, there is a possibility that she has disseminated infection that is seeding into the foot hematogenously.  She is also noted to have a murmur in the right sternal border of 2/6 and holosystolic. There are no other stigmata of infective endocarditis. Plan  -Physical examination of the foot is very concerning for right foot plantar fasciitis in addition to abscess  formation. Orthopedic surgery has been consulted, and they'll review the patient this afternoon.This is day 2 of vancomycin to cover MRSA. 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 If, indeed, we find any vegetations on her heart valves, she is likely to require prolonged antibiotic therapy upon discharge -Blood cultures-pending. This will direct abx therapy.  - Will also do tissue/pus culture after surgical debridement or I&D  - Will continue to monitor WBC. It has improved from 31-25. -Urinalysis - She has no previous EKG. EKG ordered.  Uncontrolled Diabetes: The patient's home medications for diabetes include glimepiride and metformin. She also reports that she is been off medication for a long time due to cost. HbA1C is 15.6%.  Plan  -switch to Insulin therapy with 10IU of 70/30 Novolog bid in addition to meal coverage insulin. This will be a more affordable option compared to Lantus. - I have spoken to [name] regarding resources needs including  - She will be set up with internal medicine clinic for outpatient follow up.  Hypertension: Patient's home medications include hydrochlorothiazide. On admission her blood pressure was 110/86. She reports compliance with this medication.  Plan  -Will continue with home medications.     LOS: 1 day   Dow Adolph 09/22/2012, 5:06 PM

## 2012-09-22 NOTE — H&P (Signed)
Linda Miller is an 56 y.o. female.   Chief Complaint: Abscess right foot HPI: Patient is a 56 year old woman with a poorly controlled diabetes who presents with a one-week history of abscess on the plantar aspect of the right heel. Patient denies any trauma denies any foreign body penetration.  Past Medical History  Diagnosis Date  . Diabetes mellitus   . Hypertension   . High cholesterol     Past Surgical History  Procedure Date  . Abdominal hysterectomy     History reviewed. No pertinent family history. Social History:  reports that she has been smoking.  She does not have any smokeless tobacco history on file. She reports that she drinks alcohol. She reports that she does not use illicit drugs.  Allergies:  Allergies  Allergen Reactions  . Codeine Other (See Comments)    Throat Swelling and itching  . Tramadol Hives and Itching    Medications Prior to Admission  Medication Sig Dispense Refill  . glimepiride (AMARYL) 4 MG tablet Take 4 mg by mouth daily before breakfast.      . hydrochlorothiazide (HYDRODIURIL) 25 MG tablet Take 25 mg by mouth daily.      . metFORMIN (GLUCOPHAGE) 1000 MG tablet Take 1 tablet (1,000 mg total) by mouth 2 (two) times daily.  30 tablet  0    Results for orders placed during the hospital encounter of 09/21/12 (from the past 48 hour(s))  CBC     Status: Abnormal   Collection Time   09/21/12 12:34 PM      Component Value Range Comment   WBC 31.9 (*) 4.0 - 10.5 K/uL    RBC 5.02  3.87 - 5.11 MIL/uL    Hemoglobin 13.2  12.0 - 15.0 g/dL    HCT 30.8  65.7 - 84.6 %    MCV 77.9 (*) 78.0 - 100.0 fL    MCH 26.3  26.0 - 34.0 pg    MCHC 33.8  30.0 - 36.0 g/dL    RDW 96.2  95.2 - 84.1 %    Platelets 352  150 - 400 K/uL   BASIC METABOLIC PANEL     Status: Abnormal   Collection Time   09/21/12 12:34 PM      Component Value Range Comment   Sodium 136  135 - 145 mEq/L    Potassium 4.0  3.5 - 5.1 mEq/L    Chloride 101  96 - 112 mEq/L    CO2 24  19  - 32 mEq/L    Glucose, Bld 259 (*) 70 - 99 mg/dL    BUN 6  6 - 23 mg/dL    Creatinine, Ser 3.24  0.50 - 1.10 mg/dL    Calcium 9.4  8.4 - 40.1 mg/dL    GFR calc non Af Amer >90  >90 mL/min    GFR calc Af Amer >90  >90 mL/min   DIFFERENTIAL     Status: Abnormal   Collection Time   09/21/12 12:34 PM      Component Value Range Comment   Neutrophils Relative 87 (*) 43 - 77 %    Lymphocytes Relative 7 (*) 12 - 46 %    Monocytes Relative 6  3 - 12 %    Eosinophils Relative 0  0 - 5 %    Basophils Relative 0  0 - 1 %    Band Neutrophils 0  0 - 10 %    Metamyelocytes Relative 0      Myelocytes 0  Promyelocytes Absolute 0      Blasts 0      nRBC 0  0 /100 WBC    Neutro Abs 27.8 (*) 1.7 - 7.7 K/uL    Lymphs Abs 2.2  0.7 - 4.0 K/uL    Monocytes Absolute 1.9 (*) 0.1 - 1.0 K/uL    Eosinophils Absolute 0.0  0.0 - 0.7 K/uL    Basophils Absolute 0.0  0.0 - 0.1 K/uL    Smear Review LARGE PLATELETS PRESENT     HEMOGLOBIN A1C     Status: Abnormal   Collection Time   09/21/12  5:50 PM      Component Value Range Comment   Hemoglobin A1C 15.6 (*) <5.7 %    Mean Plasma Glucose 401 (*) <117 mg/dL   GLUCOSE, CAPILLARY     Status: Abnormal   Collection Time   09/21/12  6:12 PM      Component Value Range Comment   Glucose-Capillary 251 (*) 70 - 99 mg/dL   GLUCOSE, CAPILLARY     Status: Abnormal   Collection Time   09/21/12  9:51 PM      Component Value Range Comment   Glucose-Capillary 201 (*) 70 - 99 mg/dL   GLUCOSE, CAPILLARY     Status: Abnormal   Collection Time   09/22/12  6:48 AM      Component Value Range Comment   Glucose-Capillary 284 (*) 70 - 99 mg/dL   COMPREHENSIVE METABOLIC PANEL     Status: Abnormal   Collection Time   09/22/12  7:00 AM      Component Value Range Comment   Sodium 135  135 - 145 mEq/L    Potassium 3.4 (*) 3.5 - 5.1 mEq/L    Chloride 102  96 - 112 mEq/L    CO2 22  19 - 32 mEq/L    Glucose, Bld 333 (*) 70 - 99 mg/dL    BUN 5 (*) 6 - 23 mg/dL     Creatinine, Ser 4.09  0.50 - 1.10 mg/dL    Calcium 8.5  8.4 - 81.1 mg/dL    Total Protein 6.4  6.0 - 8.3 g/dL    Albumin 2.5 (*) 3.5 - 5.2 g/dL    AST 7  0 - 37 U/L    ALT 5  0 - 35 U/L    Alkaline Phosphatase 75  39 - 117 U/L    Total Bilirubin 0.2 (*) 0.3 - 1.2 mg/dL    GFR calc non Af Amer >90  >90 mL/min    GFR calc Af Amer >90  >90 mL/min   CBC     Status: Abnormal   Collection Time   09/22/12  7:00 AM      Component Value Range Comment   WBC 25.9 (*) 4.0 - 10.5 K/uL    RBC 4.64  3.87 - 5.11 MIL/uL    Hemoglobin 11.9 (*) 12.0 - 15.0 g/dL    HCT 91.4  78.2 - 95.6 %    MCV 77.6 (*) 78.0 - 100.0 fL    MCH 25.6 (*) 26.0 - 34.0 pg    MCHC 33.1  30.0 - 36.0 g/dL    RDW 21.3  08.6 - 57.8 %    Platelets 362  150 - 400 K/uL   GLUCOSE, CAPILLARY     Status: Abnormal   Collection Time   09/22/12 12:05 PM      Component Value Range Comment   Glucose-Capillary 259 (*) 70 - 99 mg/dL  URINALYSIS, ROUTINE W REFLEX MICROSCOPIC     Status: Abnormal   Collection Time   09/22/12  2:07 PM      Component Value Range Comment   Color, Urine YELLOW  YELLOW    APPearance CLEAR  CLEAR    Specific Gravity, Urine 1.018  1.005 - 1.030    pH 5.0  5.0 - 8.0    Glucose, UA >1000 (*) NEGATIVE mg/dL    Hgb urine dipstick NEGATIVE  NEGATIVE    Bilirubin Urine NEGATIVE  NEGATIVE    Ketones, ur NEGATIVE  NEGATIVE mg/dL    Protein, ur NEGATIVE  NEGATIVE mg/dL    Urobilinogen, UA 0.2  0.0 - 1.0 mg/dL    Nitrite NEGATIVE  NEGATIVE    Leukocytes, UA SMALL (*) NEGATIVE   URINE MICROSCOPIC-ADD ON     Status: Abnormal   Collection Time   09/22/12  2:07 PM      Component Value Range Comment   Squamous Epithelial / LPF FEW (*) RARE    WBC, UA 7-10  <3 WBC/hpf    Bacteria, UA MANY (*) RARE    Urine-Other RARE YEAST   MUCOUS PRESENT  GLUCOSE, CAPILLARY     Status: Abnormal   Collection Time   09/22/12  3:53 PM      Component Value Range Comment   Glucose-Capillary 115 (*) 70 - 99 mg/dL   MRSA PCR  SCREENING     Status: Normal   Collection Time   09/22/12  5:20 PM      Component Value Range Comment   MRSA by PCR NEGATIVE  NEGATIVE    [place] Extrem Low Right Ltd  09/21/2012  *RADIOLOGY REPORT*  Clinical Data: Heel pain after stepping on a rock.  Swelling.  ULTRASOUND RIGHT LOWER EXTREMITY LIMITED  Technique:  Ultrasound examination of the region of interest in the right lower extremity was performed.  Comparison:  None.  Findings: The area of injury is on the plantar aspect of the forehead (heel). No echogenic foreign body is seen.  There is no visible abscess.  Sonographic interrogation extended upward to the medial ankle where there is no discrete fluid collection.  IMPRESSION: No discrete abscess or focal fluid collection is seen.  Diffuse soft tissue swelling is noted.   Original Report Authenticated By: Elsie Stain, M.D.     Review of Systems  All other systems reviewed and are negative.    Blood pressure 138/65, pulse 100, temperature 97.5 F (36.4 C), temperature source Oral, resp. rate 18, height 5\' 3"  (1.6 m), weight 63.504 kg (140 lb), SpO2 100.00%. Physical Exam   on examination patient has a good dorsalis pedis pulse. She has a large abscess on the plantar aspect of the right heel this extends up the tarsal tunnel with cellulitis proximally to the malleolar line. Assessment/Plan Assessment: Abscess plantar aspect right heel.  Plan: We'll plan for irrigation debridement of the abscess. Risks and benefits were discussed with the patient including persistent infection nonhealing of the wound potential for amputation. Patient states she understands and wished to proceed at this time. Will plan for surgery tomorrow morning.  Delores Thelen V 09/22/2012, 7:20 PM

## 2012-09-22 NOTE — Progress Notes (Signed)
ANTIBIOTIC CONSULT NOTE - FOLLOW UP  Pharmacy Consult for unasyn Indication: multiple infection sites with severe infection of the right foot   Allergies  Allergen Reactions  . Codeine Other (See Comments)    Throat Swelling and itching  . Tramadol Hives and Itching    Patient Measurements: Height: 5\' 3"  (160 cm) Weight: 140 lb (63.504 kg) IBW/kg (Calculated) : 52.4    Vital Signs: Temp: 98.2 F (36.8 C) (10/29 0617) Temp src: Oral (10/29 0617) BP: 132/65 mmHg (10/29 0617) Pulse Rate: 102  (10/29 0617) Intake/Output from previous day: 10/28 0701 - 10/29 0700 In: 120 [P.O.:120] Out: -  Intake/Output from this shift:    Labs:  Basename 09/22/12 0700 09/21/12 1234  WBC 25.9* 31.9*  HGB 11.9* 13.2  PLT 362 352  LABCREA -- --  CREATININE 0.51 0.60   Estimated Creatinine Clearance: 72.1 ml/min (by C-G formula based on Cr of 0.51).   Assessment: 56 yo female with R foot pain and abscesses at multiple sites on vancomycin to start unasyn.  Goal of Therapy:  Vancomycin trough level 10-15 mcg/ml  Plan:  -Unasyn 3mg  IV q6h -Will follow renal function and progress  Harland German, Pharm D 09/22/2012 1:22 PM

## 2012-09-23 ENCOUNTER — Inpatient Hospital Stay (HOSPITAL_COMMUNITY): Payer: Medicaid Other | Admitting: *Deleted

## 2012-09-23 ENCOUNTER — Encounter (HOSPITAL_COMMUNITY): Payer: Self-pay | Admitting: *Deleted

## 2012-09-23 ENCOUNTER — Encounter (HOSPITAL_COMMUNITY)
Admission: EM | Disposition: A | Discharge status: Home / Self Care | Payer: Self-pay | Source: Home / Self Care | Attending: Internal Medicine

## 2012-09-23 ENCOUNTER — Encounter (HOSPITAL_COMMUNITY): Payer: Self-pay | Admitting: General Surgery

## 2012-09-23 DIAGNOSIS — S31809A Unspecified open wound of unspecified buttock, initial encounter: Secondary | ICD-10-CM

## 2012-09-23 HISTORY — PX: I & D EXTREMITY: SHX5045

## 2012-09-23 LAB — MAGNESIUM: Magnesium: 1.8 mg/dL (ref 1.5–2.5)

## 2012-09-23 LAB — SURGICAL PCR SCREEN
MRSA, PCR: POSITIVE — AB
Staphylococcus aureus: POSITIVE — AB

## 2012-09-23 LAB — CBC
Platelets: 379 10*3/uL (ref 150–400)
RBC: 4.44 MIL/uL (ref 3.87–5.11)
RDW: 13.7 % (ref 11.5–15.5)
WBC: 20.3 10*3/uL — ABNORMAL HIGH (ref 4.0–10.5)

## 2012-09-23 LAB — BASIC METABOLIC PANEL
CO2: 25 mEq/L (ref 19–32)
Chloride: 100 mEq/L (ref 96–112)
GFR calc Af Amer: >90 mL/min (ref >90)
Potassium: 3.3 mEq/L — ABNORMAL LOW (ref 3.5–5.1)
Sodium: 135 mEq/L (ref 135–145)

## 2012-09-23 LAB — HIV ANTIBODY (ROUTINE TESTING W REFLEX): HIV: NONREACTIVE

## 2012-09-23 LAB — GLUCOSE, CAPILLARY: Glucose-Capillary: 163 mg/dL — ABNORMAL HIGH (ref 70–99)

## 2012-09-23 SURGERY — IRRIGATION AND DEBRIDEMENT EXTREMITY
Anesthesia: General | Site: Foot | Laterality: Right | Wound class: Dirty or Infected

## 2012-09-23 MED ORDER — MIDAZOLAM HCL 5 MG/5ML IJ SOLN
INTRAMUSCULAR | Status: DC | PRN
  Administered 2012-09-23 (×2): .5 mg via INTRAVENOUS
  Administered 2012-09-23: 1 mg via INTRAVENOUS

## 2012-09-23 MED ORDER — CHLORHEXIDINE GLUCONATE CLOTH 2 % EX PADS
6.0000 | MEDICATED_PAD | Freq: Every day | CUTANEOUS | Status: AC
  Administered 2012-09-23 – 2012-09-27 (×2): 6 via TOPICAL

## 2012-09-23 MED ORDER — SODIUM CHLORIDE 0.9 % IR SOLN
Status: DC | PRN
  Administered 2012-09-23: 1000 mL

## 2012-09-23 MED ORDER — HYDROMORPHONE HCL PF 1 MG/ML IJ SOLN
INTRAMUSCULAR | Status: AC
  Filled 2012-09-23: qty 1

## 2012-09-23 MED ORDER — LACTATED RINGERS IV SOLN
INTRAVENOUS | Status: DC | PRN
  Administered 2012-09-23: 08:00:00 via INTRAVENOUS

## 2012-09-23 MED ORDER — PROMETHAZINE HCL 25 MG/ML IJ SOLN
6.2500 mg | INTRAMUSCULAR | Status: DC | PRN
  Filled 2012-09-23: qty 1

## 2012-09-23 MED ORDER — HYDROMORPHONE HCL PF 1 MG/ML IJ SOLN
2.0000 mg | INTRAMUSCULAR | Status: DC | PRN
  Administered 2012-09-23 – 2012-09-29 (×36): 2 mg via INTRAVENOUS
  Filled 2012-09-23 (×4): qty 2
  Filled 2012-09-23: qty 1
  Filled 2012-09-23: qty 2
  Filled 2012-09-23: qty 1
  Filled 2012-09-23 (×8): qty 2
  Filled 2012-09-23: qty 1
  Filled 2012-09-23 (×8): qty 2
  Filled 2012-09-23: qty 1
  Filled 2012-09-23 (×12): qty 2

## 2012-09-23 MED ORDER — PHENYLEPHRINE HCL 10 MG/ML IJ SOLN
INTRAMUSCULAR | Status: DC | PRN
  Administered 2012-09-23: 120 ug via INTRAVENOUS

## 2012-09-23 MED ORDER — MUPIROCIN 2 % EX OINT
1.0000 "application " | TOPICAL_OINTMENT | Freq: Two times a day (BID) | CUTANEOUS | Status: AC
  Administered 2012-09-23 – 2012-09-27 (×9): 1 via NASAL
  Filled 2012-09-23 (×3): qty 22

## 2012-09-23 MED ORDER — FENTANYL CITRATE 0.05 MG/ML IJ SOLN
INTRAMUSCULAR | Status: DC | PRN
  Administered 2012-09-23 (×4): 50 ug via INTRAVENOUS

## 2012-09-23 MED ORDER — LIDOCAINE HCL (CARDIAC) 20 MG/ML IV SOLN
INTRAVENOUS | Status: DC | PRN
  Administered 2012-09-23: 80 mg via INTRAVENOUS

## 2012-09-23 MED ORDER — CHLORHEXIDINE GLUCONATE 4 % EX LIQD
60.0000 mL | Freq: Once | CUTANEOUS | Status: AC
  Administered 2012-09-23: 4 via TOPICAL
  Filled 2012-09-23: qty 60

## 2012-09-23 MED ORDER — HYDROMORPHONE HCL PF 1 MG/ML IJ SOLN
0.2500 mg | INTRAMUSCULAR | Status: DC | PRN
  Administered 2012-09-23 (×4): 0.5 mg via INTRAVENOUS

## 2012-09-23 MED ORDER — POTASSIUM CHLORIDE 10 MEQ/100ML IV SOLN
10.0000 meq | INTRAVENOUS | Status: AC
  Administered 2012-09-23 (×4): 10 meq via INTRAVENOUS
  Filled 2012-09-23 (×4): qty 100

## 2012-09-23 MED ORDER — PROPOFOL 10 MG/ML IV BOLUS
INTRAVENOUS | Status: DC | PRN
  Administered 2012-09-23: 140 mg via INTRAVENOUS

## 2012-09-23 SURGICAL SUPPLY — 47 items
BANDAGE GAUZE ELAST BULKY 4 IN (GAUZE/BANDAGES/DRESSINGS) ×2 IMPLANT
BLADE SURG 10 STRL SS (BLADE) ×1 IMPLANT
BLADE SURG 21 STRL SS (BLADE) ×1 IMPLANT
BNDG COHESIVE 4X5 TAN STRL (GAUZE/BANDAGES/DRESSINGS) ×2 IMPLANT
BNDG COHESIVE 6X5 TAN STRL LF (GAUZE/BANDAGES/DRESSINGS) ×2 IMPLANT
BNDG GAUZE STRTCH 6 (GAUZE/BANDAGES/DRESSINGS) ×3 IMPLANT
CLOTH BEACON ORANGE TIMEOUT ST (SAFETY) ×2 IMPLANT
COTTON STERILE ROLL (GAUZE/BANDAGES/DRESSINGS) ×1 IMPLANT
COVER SURGICAL LIGHT HANDLE (MISCELLANEOUS) ×2 IMPLANT
CUFF TOURNIQUET SINGLE 18IN (TOURNIQUET CUFF) ×1 IMPLANT
CUFF TOURNIQUET SINGLE 24IN (TOURNIQUET CUFF) IMPLANT
CUFF TOURNIQUET SINGLE 34IN LL (TOURNIQUET CUFF) IMPLANT
CUFF TOURNIQUET SINGLE 44IN (TOURNIQUET CUFF) IMPLANT
DRAPE U-SHAPE 47X51 STRL (DRAPES) ×2 IMPLANT
DRSG ADAPTIC 3X8 NADH LF (GAUZE/BANDAGES/DRESSINGS) ×2 IMPLANT
DRSG PAD ABDOMINAL 8X10 ST (GAUZE/BANDAGES/DRESSINGS) ×2 IMPLANT
DURAPREP 26ML APPLICATOR (WOUND CARE) ×2 IMPLANT
ELECT CAUTERY BLADE 6.4 (BLADE) IMPLANT
ELECT REM PT RETURN 9FT ADLT (ELECTROSURGICAL) ×2
ELECTRODE REM PT RTRN 9FT ADLT (ELECTROSURGICAL) IMPLANT
GLOVE BIOGEL PI IND STRL 9 (GLOVE) ×1 IMPLANT
GLOVE BIOGEL PI INDICATOR 9 (GLOVE) ×1
GLOVE SURG ORTHO 9.0 STRL STRW (GLOVE) ×2 IMPLANT
GOWN PREVENTION PLUS XLARGE (GOWN DISPOSABLE) ×2 IMPLANT
GOWN SRG XL XLNG 56XLVL 4 (GOWN DISPOSABLE) ×1 IMPLANT
GOWN STRL NON-REIN XL XLG LVL4 (GOWN DISPOSABLE) ×2
HANDPIECE INTERPULSE COAX TIP (DISPOSABLE)
KIT BASIN OR (CUSTOM PROCEDURE TRAY) ×2 IMPLANT
KIT ROOM TURNOVER OR (KITS) ×2 IMPLANT
MANIFOLD NEPTUNE II (INSTRUMENTS) ×1 IMPLANT
NS IRRIG 1000ML POUR BTL (IV SOLUTION) ×2 IMPLANT
PACK ORTHO EXTREMITY (CUSTOM PROCEDURE TRAY) ×2 IMPLANT
PAD ARMBOARD 7.5X6 YLW CONV (MISCELLANEOUS) ×3 IMPLANT
PADDING CAST COTTON 6X4 STRL (CAST SUPPLIES) ×1 IMPLANT
SET HNDPC FAN SPRY TIP SCT (DISPOSABLE) IMPLANT
SPONGE GAUZE 4X4 12PLY (GAUZE/BANDAGES/DRESSINGS) ×2 IMPLANT
SPONGE LAP 18X18 X RAY DECT (DISPOSABLE) ×2 IMPLANT
STOCKINETTE IMPERVIOUS 9X36 MD (GAUZE/BANDAGES/DRESSINGS) ×1 IMPLANT
SUT ETHILON 2 0 PSLX (SUTURE) ×2 IMPLANT
SWAB COLLECTION DEVICE MRSA (MISCELLANEOUS) ×1 IMPLANT
TOWEL OR 17X24 6PK STRL BLUE (TOWEL DISPOSABLE) ×2 IMPLANT
TOWEL OR 17X26 10 PK STRL BLUE (TOWEL DISPOSABLE) ×2 IMPLANT
TUBE ANAEROBIC SPECIMEN COL (MISCELLANEOUS) ×1 IMPLANT
TUBE CONNECTING 12X1/4 (SUCTIONS) ×1 IMPLANT
UNDERPAD 30X30 INCONTINENT (UNDERPADS AND DIAPERS) ×1 IMPLANT
WATER STERILE IRR 1000ML POUR (IV SOLUTION) ×1 IMPLANT
YANKAUER SUCT BULB TIP NO VENT (SUCTIONS) ×1 IMPLANT

## 2012-09-23 NOTE — Anesthesia Postprocedure Evaluation (Signed)
Anesthesia Post Note  Patient: Health and safety inspector  Procedure(s) Performed: Procedure(s) (LRB): IRRIGATION AND DEBRIDEMENT EXTREMITY (Right)  Anesthesia type: general  Patient location: PACU  Post pain: Pain level controlled  Post assessment: Patient's Cardiovascular Status Stable  Last Vitals:  Filed Vitals:   09/23/12 0945  BP: 119/70  Pulse:   Temp: 36.8 C  Resp:     Post vital signs: Reviewed and stable  Level of consciousness: sedated  Complications: No apparent anesthesia complications

## 2012-09-23 NOTE — Anesthesia Preprocedure Evaluation (Signed)
Anesthesia Evaluation  Patient identified by MRN, date of birth, ID band Patient awake    Reviewed: Allergy & Precautions, H&P , NPO status , Patient's Chart, lab work & pertinent test results  History of Anesthesia Complications Negative for: history of anesthetic complications  Airway Mallampati: II TM Distance: >3 FB Neck ROM: Full    Dental  (+) Dental Advisory Given   Pulmonary Current Smoker,          Cardiovascular hypertension,     Neuro/Psych negative neurological ROS  negative psych ROS   GI/Hepatic negative GI ROS, Neg liver ROS,   Endo/Other  diabetes, Oral Hypoglycemic Agents  Renal/GU negative Renal ROS  negative genitourinary   Musculoskeletal negative musculoskeletal ROS (+)   Abdominal   Peds  Hematology negative hematology ROS (+)   Anesthesia Other Findings   Reproductive/Obstetrics                           Anesthesia Physical Anesthesia Plan  ASA: III  Anesthesia Plan: General   Post-op Pain Management:    Induction: Intravenous  Airway Management Planned: LMA  Additional Equipment:   Intra-op Plan:   Post-operative Plan:   Informed Consent:   Dental advisory given  Plan Discussed with: CRNA, Anesthesiologist and Surgeon  Anesthesia Plan Comments: (Poor dental health- chipped teeth)        Anesthesia Quick Evaluation

## 2012-09-23 NOTE — Progress Notes (Signed)
Medical Student Daily Progress Note  Subjective: Linda Miller was seen this afternoon and examined bedside. Her R foot is bandaged and painful to deep touch in the entire bandaged area, from ankle to toes, as well when she moves it. She reports that the abscess on her L buttock hurts more than yesterday and she is trying to accommodate for it by not lying on that side. Her pain is rated 7/10 compared to the pain she felt immediately after surgery. She denies N/V/HA/ABD Pain at this time.  Objective: Vital signs in last 24 hours: Filed Vitals:   09/23/12 0600 09/23/12 0900 09/23/12 0915 09/23/12 0945  BP: 122/64 126/78 121/72 119/70  Pulse: 95 107    Temp: 99.1 F (37.3 C) 97.5 F (36.4 C)  98.2 F (36.8 C)  TempSrc:      Resp: 20 18    Height:      Weight:      SpO2: 100% 100%  95%   Weight change:   Intake/Output Summary (Last 24 hours) at 09/23/12 1331 Last data filed at 09/23/12 0852  Gross per 24 hour  Intake    640 ml  Output     25 ml  Net    615 ml   Physical Exam: BP 119/70  Pulse 107  Temp 98.2 F (36.8 C) (Oral)  Resp 18  Ht 5\' 3"  (1.6 m)  Wt 63.504 kg (140 lb)  BMI 24.80 kg/m2  SpO2 95%  General Appearance:   Alert, cooperative, appears stated age  Positive for:  distressed when discussing her pain  Head:    Normocephalic, atraumatic   Eyes:    EOM's intact both eyes   Ears:    Normal external ear canals, both ears   Nose:   Nares normal, septum midline, no drainage tenderness  Throat:   Lips nd tongue normal; teeth normal  Neck:   Supple, symmetrical, trachea midline, no adenopathy;  thyroid: No obvious enlargement  Back:     Symmetric, no curvature, ROM normal   Lungs:     Clear to auscultation bilaterally, respirations unlabored   Chest Wall:    No tenderness or deformity    Heart:    Regular rate and rhythm, no murmur noticed at this time  Abdomen:     Soft, non-tender, bowel sounds active all four quadrants,    no masses, no organomegaly    Extremities:   L extremity normal, atraumatic, no cyanosis or edema  Positive for:  R extremity is still mildly swollen compared to left, warm to touch and warmer than left. Right foot still has compression bandage from OR.  Abscess on Left buttock is round, approximately 12mm in diameter, with black eschar at the center of and red, weeping margins. Purulent exudate is draining from the circumference.  Immediately below this is a 3mm, skin colored welt that is not purulent or weeping at this time.  Pulses:   2+ and symmetric all extremities  Skin:   Skin color, texture, turgor normal, no rashes or lesions, with exception of areas noted above in exam.  Neurologic:   AAOx3, normal cognition and affect       Lab Results: Basic Metabolic Panel:  Lab 09/23/12 5621 09/23/12 0545 09/22/12 0700  NA -- 135 135  K -- 3.3* 3.4*  CL -- 100 102  CO2 -- 25 22  GLUCOSE -- 248* 333*  BUN -- 4* 5*  CREATININE -- 0.61 0.51  CALCIUM -- 8.8 8.5  MG 1.8 -- --  PHOS -- -- --   CBC:  Lab 09/23/12 0545 09/22/12 0700 09/21/12 1234  WBC 20.3* 25.9* --  NEUTROABS -- -- 27.8*  HGB 11.3* 11.9* --  HCT 34.9* 36.0 --  MCV 78.6 77.6* --  PLT 379 362 --    Urinalysis:     . ampicillin-sulbactam (UNASYN) IV  3 g Intravenous Q6H  . chlorhexidine  60 mL Topical Once  . Chlorhexidine Gluconate Cloth  6 each Topical Q0600  . enoxaparin (LOVENOX) injection  40 mg Subcutaneous Q24H  . hydrochlorothiazide  25 mg Oral Q breakfast  . HYDROmorphone      . HYDROmorphone      . influenza  inactive virus vaccine  0.5 mL Intramuscular Tomorrow-1000  . insulin aspart  0-9 Units Subcutaneous TID AC & HS  . insulin aspart protamine-insulin aspart  10 Units Subcutaneous BID WC  . ketorolac  30 mg Intravenous Once  . mupirocin ointment  1 application Nasal BID  . nicotine  14 mg Transdermal Daily  . pneumococcal 23 valent vaccine  0.5 mL Intramuscular Tomorrow-1000  . potassium chloride  10 mEq Intravenous Q1  Hr x 4  . vancomycin  750 mg Intravenous Q12H   Continuous Infusions:    . sodium chloride 50 mL/hr at 09/23/12 1203   PRN Meds:.acetaminophen, acetaminophen, alum & mag hydroxide-simeth, HYDROmorphone (DILAUDID) injection, ondansetron (ZOFRAN) IV, ondansetron, oxyCODONE-acetaminophen, promethazine, DISCONTD:  HYDROmorphone (DILAUDID) injection, DISCONTD:  HYDROmorphone (DILAUDID) injection, DISCONTD: sodium chloride irrigation Assessment/Plan: Principal Problem:  *Cellulitis of right foot Active Problems:  Diabetes mellitus, type II  Hypertension  Leukocytosis   LOS: 2 days   This is a Psychologist, occupational Note.  The care of the patient was discussed with Dr. Zada Girt and the assessment and plan formulated with their assistance.  Please see their attached note for official documentation of the daily encounter.  Ellin Mayhew 09/23/2012, 1:31 PM  6:08 PM Resident Co-sign Daily Note: I have seen the patient and reviewed the daily progress note by Linda Carl MS 3 and discussed the care of the patient with them.  See below for documentation of my findings, assessment, and plans.  Subjective: Assessed the patient after she had just returned from surgery this morning. She had I&D performed on her right foot Objective: Vital signs in last 24 hours: Filed Vitals:   09/23/12 0900 09/23/12 0915 09/23/12 0945 09/23/12 1400  BP: 126/78 121/72 119/70 120/70  Pulse: 107   100  Temp: 97.5 F (36.4 C)  98.2 F (36.8 C) 98 F (36.7 C)  TempSrc:    Oral  Resp: 18   18  Height:      Weight:      SpO2: 100%  95% 96%   Physical Exam: General appearance: alert, cooperative, severe distress and in severe pain and tearful Lungs: clear to auscultation bilaterally Heart: systolic murmur: holosystolic 2/6, blowing at lower left sternal border Abdomen: soft, non-tender; bowel sounds normal; no masses,  no organomegaly Pulses: 2+ and symmetric Skin: Skin color, texture, turgor normal. No rashes  or lesions or areas of previous infection involving the right upper extremity. Patient also has a an abscess on the left buttock, measuring about 5 cm in diameter. On the gentle expression, there is purulent discharge.  Continuous Infusions:   . sodium chloride 50 mL/hr at 09/23/12 1203   PRN Meds:.acetaminophen, acetaminophen, alum & mag hydroxide-simeth, HYDROmorphone (DILAUDID) injection, ondansetron (ZOFRAN) IV, ondansetron, oxyCODONE-acetaminophen, promethazine, DISCONTD:  HYDROmorphone (DILAUDID) injection, DISCONTD:  HYDROmorphone (DILAUDID) injection, DISCONTD:  sodium chloride irrigation Assessment/Plan: Right foot cellulitis with possible disseminated infection. Status post irrigation debridement excision of the foot this morning. A partial calcaneal excision was also performed. Specimens were sent for culture.  In this patient with uncontrolled, type 2 diabetes, and history of recurrent skin infections with the most vaginal abscess that required I&D, there is a possibility that she has disseminated infection that is seeding into the foot hematogenously. She is also noted to have a murmur in the right sternal border of 2/6 and holosystolic. There are no other stigmata of infective endocarditis. Transthoracic echocardiogram revealed a possible small vegetation, and that tip of mitral valve. EKG-normal. The patient tested positive on surgical PCR screening for MRSA. Plan  -This is day #4 of vancomycin, and day. #3 of Unasyn  -Blood cultures-NGTD. We will another Blood cx tomorrow which will be 48hrs apart from the first one. -Tissue culture-pending - Will continue to monitor WBC. It has improved from 31 on admission to 20 currently. -A TEE will be performed tomorrow -buttock wound did not yield pus on aspiration using a 18G needle. No surgical intervention was recommended by surgery. To continue with antibiotics.  Uncontrolled Diabetes: The patient's home medications for diabetes include  glimepiride and metformin. She also reports that she is been off medication for a long time due to cost. HbA1C is 15.6%.  Plan  -switch to Insulin therapy with 10IU of 70/30 Novolog bid in addition to meal coverage insulin. This will be a more affordable option compared to Lantus.  -[name] is assisting setting her up with IM clinic - She will be set up with internal medicine clinic for outpatient follow up.   Hypertension: Patient's home medications include hydrochlorothiazide. On admission her blood pressure was 110/86. She reports compliance with this medication.  Plan  -Will continue with home medications.          LOS: 2 days   Dow Adolph 09/23/2012, 6:14 PM

## 2012-09-23 NOTE — Transfer of Care (Signed)
Immediate Anesthesia Transfer of Care Note  Patient: Linda Miller  Procedure(s) Performed: Procedure(s) (LRB) with comments: IRRIGATION AND DEBRIDEMENT EXTREMITY (Right)  Patient Location: PACU  Anesthesia Type:General  Level of Consciousness: awake, alert  and oriented  Airway & Oxygen Therapy: Patient Spontanous Breathing and Patient connected to nasal cannula oxygen  Post-op Assessment: Report given to PACU RN and Post -op Vital signs reviewed and stable  Post vital signs: Reviewed and stable  Complications: No apparent anesthesia complications

## 2012-09-23 NOTE — Progress Notes (Signed)
Asked by IM team to visit patient while in hospital. Called patient yesterday, she asked for visit today. CDE came by, but again not an appropriate time for Diabetes needs assessment. Note uncontrolled diabetes, started on insulin this admit and uninsured. Will try again to meet with patient prior to discharge.

## 2012-09-23 NOTE — Interval H&P Note (Signed)
History and Physical Interval Note:  09/23/2012 6:10 AM  Linda Miller  has presented today for surgery, with the diagnosis of Heel Abscess  The various methods of treatment have been discussed with the patient and family. After consideration of risks, benefits and other options for treatment, the patient has consented to  Procedure(s) (LRB) with comments: IRRIGATION AND DEBRIDEMENT EXTREMITY (Right) as a surgical intervention .  The patient's history has been reviewed, patient examined, no change in status, stable for surgery.  I have reviewed the patient's chart and labs.  Questions were answered to the patient's satisfaction.     Linda Miller V

## 2012-09-23 NOTE — Progress Notes (Signed)
09/23/12 Pt. non-compliant. Pt tried to remove compression dressing to foot multiple times throughout the day. Pt education done. Each time pt was informed she must leave on, and dressing was reapplied/fixed. Pt stated it was "too tight." Assessment within normal limits. Pt was able to wiggle toes and roll foot. No discoloration to toes. Vascular check within normal limits.   At 1800 Pt removed dressing completely and refused to put on again. MD was notified. MD assessed pt and told her she must wear dressing to prevent bleeding and for her foot to heal properly. Pt still hesitant.   After MD left, Pt stated she wanted to use the bathroom. Pt asked to wait until dressing was reapplied or use bedside commode being careful of foot or bedpan if she really had to go so that her foot would be protected. Pt refused. She got up out of bed and walked to bathroom despite staff pleas and bed alarm being on. Pt education done and asked to please comply with safety policies and  medical treatment. Pt still seems hesitant. Will continue to monitor.

## 2012-09-23 NOTE — Preoperative (Signed)
Beta Blockers   Reason not to administer Beta Blockers:Not Applicable 

## 2012-09-23 NOTE — Op Note (Signed)
OPERATIVE REPORT  DATE OF SURGERY: 09/23/2012  PATIENT:  Linda Miller,  56 y.o. female  PRE-OPERATIVE DIAGNOSIS:  Heel Abscess, right   POST-OPERATIVE DIAGNOSIS:  Heel Abscess, right   PROCEDURE:  Procedure(s): IRRIGATION AND DEBRIDEMENT EXTREMITY Excision of skin soft tissue and muscle. Partial calcaneal excision. Cultures obtained x2.  SURGEON:  Surgeon(s): Nadara Mustard, MD  ANESTHESIA:   general  EBL:  Minimal ML  SPECIMEN:  Source of Specimen:  Cultures obtained x2 from right heel abscess  TOURNIQUET:  * No tourniquets in log *  PROCEDURE DETAILS: Patient is a 56 year old woman with uncontrolled type 2 diabetes who presents with over one week history of abscess of her right heel. Patient presents at this time for irrigation debridement excision of necrotic tissue and partial calcaneal excision. Risks and benefits were discussed including persistent infection nonhealing of the wound need for additional surgery. Patient states she understands and wishes to proceed at this time. Description of procedure patient was brought to the operating room and underwent a general anesthetic. After adequate levels of anesthesia were obtained patient's right lower extremity was prepped using DuraPrep and draped into a sterile field. A midline longitudinal incision was made from the Achilles to the mid aspect of the plantar aspect of her foot. Patient had a large abscess large amount of necrotic tissue and over 4 cm of necrotic skin soft tissue muscle was excised from the heel pad. Patient had also extended down to bone and a partial calcaneal excision was performed. The wound was irrigated with normal saline. The wound was was closely approximated with 2-0 nylon. This was left open in the mid aspect by about 1 cm. The wound was covered with Adaptic orthopedic sponges AB dressing Kerlix and Coban. Patient was extubated taken to the PACU in stable condition.  PLAN OF CARE: Admit to inpatient    PATIENT DISPOSITION:  PACU - hemodynamically stable.   Nadara Mustard, MD 09/23/2012 9:04 AM

## 2012-09-23 NOTE — Consult Note (Addendum)
Agree with PA-Jordin Vicencio's PN Likely cellulitis-  IV abx for 7-10days Will debride No OR debridement necessary  ADDENDUM: This area was prepped with betadine and anesthetized.  An 18G needle was insert and aspiration was attempted.  No drainage was noted.  Needle was removed and dressing was applied.  See above recommendations.  No further surgical needs.  Please call back as needed.  Nashonda Limberg E 3:50 PM 09/23/2012

## 2012-09-23 NOTE — Addendum Note (Signed)
Addendum  created 09/23/12 1045 by Sande Brothers, CRNA   Modules edited:Anesthesia Flowsheet

## 2012-09-23 NOTE — Consult Note (Signed)
Linda Miller 1956/04/12  [id number].   Requesting MD: Dr. Aldean Baker Chief Complaint/Reason for Consult: buttock abscess HPI: This is a 56 yo female who presented to [facility] with c/o right foot pain after stepping on a pebble.  This apparently got infected and required debridement.  Apparently on Monday, she also noticed a "bump" on her left buttock.  She says it had gotten bigger, but was a little small today.  It is very pruritic.  She mentioned it to Dr. Lajoyce Corners today prior to her surgery and we were asked to see her for further recommendations.  Review of Systems: Please see HPI, otherwise c/o foot pain and all other systems are negative  History reviewed. No pertinent family history.  Past Medical History  Diagnosis Date  . Diabetes mellitus   . Hypertension   . High cholesterol     Past Surgical History  Procedure Date  . Abdominal hysterectomy   . Debridement  foot     Social History:  reports that she has been smoking.  She does not have any smokeless tobacco history on file. She reports that she drinks alcohol. She reports that she does not use illicit drugs.  Allergies:  Allergies  Allergen Reactions  . Codeine Other (See Comments)    Throat Swelling and itching  . Tramadol Hives and Itching    Medications Prior to Admission  Medication Sig Dispense Refill  . glimepiride (AMARYL) 4 MG tablet Take 4 mg by mouth daily before breakfast.      . hydrochlorothiazide (HYDRODIURIL) 25 MG tablet Take 25 mg by mouth daily.      . metFORMIN (GLUCOPHAGE) 1000 MG tablet Take 1 tablet (1,000 mg total) by mouth 2 (two) times daily.  30 tablet  0    Blood pressure 120/70, pulse 100, temperature 98 F (36.7 C), temperature source Oral, resp. rate 18, height 5\' 3"  (1.6 m), weight 140 lb (63.504 kg), SpO2 96.00%. Physical Exam: General: Pleasant, WD, WN, 55yo black female who is laying in bed in NAD HEENT: normocephalic, atraumatic with a pair of mesh panties wrapped around her  head.  Sclera are noninjected.  Mouth is pink and moist Skin: left superior gluteus with a 2.5x2.5cm area of induration and erythema.  The central portion of this is about 1x1cm of frank necrotic eschar.  This was removed and necrotic tissue is visible underneath.  No purulent drainage was expressed; however, due to pain, I was unable to probe the area or debride any more.  There is some dry skin and peeling skin c/w a decrease in size of the wound. Psych: A&O x 3 with an appropriate affect.   Results for orders placed during the hospital encounter of 09/21/12 (from the past 48 hour(s))  HEMOGLOBIN A1C     Status: Abnormal   Collection Time   09/21/12  5:50 PM      Component Value Range Comment   Hemoglobin A1C 15.6 (*) <5.7 %    Mean Plasma Glucose 401 (*) <117 mg/dL   GLUCOSE, CAPILLARY     Status: Abnormal   Collection Time   09/21/12  6:12 PM      Component Value Range Comment   Glucose-Capillary 251 (*) 70 - 99 mg/dL   GLUCOSE, CAPILLARY     Status: Abnormal   Collection Time   09/21/12  9:51 PM      Component Value Range Comment   Glucose-Capillary 201 (*) 70 - 99 mg/dL   GLUCOSE, CAPILLARY  Status: Abnormal   Collection Time   09/22/12  6:48 AM      Component Value Range Comment   Glucose-Capillary 284 (*) 70 - 99 mg/dL   COMPREHENSIVE METABOLIC PANEL     Status: Abnormal   Collection Time   09/22/12  7:00 AM      Component Value Range Comment   Sodium 135  135 - 145 mEq/L    Potassium 3.4 (*) 3.5 - 5.1 mEq/L    Chloride 102  96 - 112 mEq/L    CO2 22  19 - 32 mEq/L    Glucose, Bld 333 (*) 70 - 99 mg/dL    BUN 5 (*) 6 - 23 mg/dL    Creatinine, Ser 1.61  0.50 - 1.10 mg/dL    Calcium 8.5  8.4 - 09.6 mg/dL    Total Protein 6.4  6.0 - 8.3 g/dL    Albumin 2.5 (*) 3.5 - 5.2 g/dL    AST 7  0 - 37 U/L    ALT 5  0 - 35 U/L    Alkaline Phosphatase 75  39 - 117 U/L    Total Bilirubin 0.2 (*) 0.3 - 1.2 mg/dL    GFR calc non Af Amer >90  >90 mL/min    GFR calc Af Amer >90   >90 mL/min   CBC     Status: Abnormal   Collection Time   09/22/12  7:00 AM      Component Value Range Comment   WBC 25.9 (*) 4.0 - 10.5 K/uL    RBC 4.64  3.87 - 5.11 MIL/uL    Hemoglobin 11.9 (*) 12.0 - 15.0 g/dL    HCT 04.5  40.9 - 81.1 %    MCV 77.6 (*) 78.0 - 100.0 fL    MCH 25.6 (*) 26.0 - 34.0 pg    MCHC 33.1  30.0 - 36.0 g/dL    RDW 91.4  78.2 - 95.6 %    Platelets 362  150 - 400 K/uL   CULTURE, BLOOD (ROUTINE X 2)     Status: Normal (Preliminary result)   Collection Time   09/22/12  7:00 AM      Component Value Range Comment   Specimen Description BLOOD LEFT ARM      Special Requests BOTTLES DRAWN AEROBIC AND ANAEROBIC 10CC      Culture  Setup Time 09/22/2012 13:44      Culture        Value:        BLOOD CULTURE RECEIVED NO GROWTH TO DATE CULTURE WILL BE HELD FOR 5 DAYS BEFORE ISSUING A FINAL NEGATIVE REPORT   Report Status PENDING     CULTURE, BLOOD (ROUTINE X 2)     Status: Normal (Preliminary result)   Collection Time   09/22/12  7:05 AM      Component Value Range Comment   Specimen Description BLOOD LEFT ARM      Special Requests BOTTLES DRAWN AEROBIC AND ANAEROBIC 10CC      Culture  Setup Time 09/22/2012 13:44      Culture        Value:        BLOOD CULTURE RECEIVED NO GROWTH TO DATE CULTURE WILL BE HELD FOR 5 DAYS BEFORE ISSUING A FINAL NEGATIVE REPORT   Report Status PENDING     GLUCOSE, CAPILLARY     Status: Abnormal   Collection Time   09/22/12 12:05 PM      Component Value Range Comment  Glucose-Capillary 259 (*) 70 - 99 mg/dL   URINALYSIS, ROUTINE W REFLEX MICROSCOPIC     Status: Abnormal   Collection Time   09/22/12  2:07 PM      Component Value Range Comment   Color, Urine YELLOW  YELLOW    APPearance CLEAR  CLEAR    Specific Gravity, Urine 1.018  1.005 - 1.030    pH 5.0  5.0 - 8.0    Glucose, UA >1000 (*) NEGATIVE mg/dL    Hgb urine dipstick NEGATIVE  NEGATIVE    Bilirubin Urine NEGATIVE  NEGATIVE    Ketones, ur NEGATIVE  NEGATIVE mg/dL     Protein, ur NEGATIVE  NEGATIVE mg/dL    Urobilinogen, UA 0.2  0.0 - 1.0 mg/dL    Nitrite NEGATIVE  NEGATIVE    Leukocytes, UA SMALL (*) NEGATIVE   URINE MICROSCOPIC-ADD ON     Status: Abnormal   Collection Time   09/22/12  2:07 PM      Component Value Range Comment   Squamous Epithelial / LPF FEW (*) RARE    WBC, UA 7-10  <3 WBC/hpf    Bacteria, UA MANY (*) RARE    Urine-Other RARE YEAST   MUCOUS PRESENT  URINE CULTURE     Status: Normal (Preliminary result)   Collection Time   09/22/12  2:07 PM      Component Value Range Comment   Specimen Description URINE, CLEAN CATCH      Special Requests NONE      Culture  Setup Time 09/22/2012 16:04      Colony Count >=100,000 COLONIES/ML      Culture GRAM NEGATIVE RODS      Report Status PENDING     GLUCOSE, CAPILLARY     Status: Abnormal   Collection Time   09/22/12  3:53 PM      Component Value Range Comment   Glucose-Capillary 115 (*) 70 - 99 mg/dL   MRSA PCR SCREENING     Status: Normal   Collection Time   09/22/12  5:20 PM      Component Value Range Comment   MRSA by PCR NEGATIVE  NEGATIVE   GLUCOSE, CAPILLARY     Status: Abnormal   Collection Time   09/22/12 10:16 PM      Component Value Range Comment   Glucose-Capillary 249 (*) 70 - 99 mg/dL   SURGICAL PCR SCREEN     Status: Abnormal   Collection Time   09/23/12  2:00 AM      Component Value Range Comment   MRSA, PCR POSITIVE (*) NEGATIVE    Staphylococcus aureus POSITIVE (*) NEGATIVE   CBC     Status: Abnormal   Collection Time   09/23/12  5:45 AM      Component Value Range Comment   WBC 20.3 (*) 4.0 - 10.5 K/uL    RBC 4.44  3.87 - 5.11 MIL/uL    Hemoglobin 11.3 (*) 12.0 - 15.0 g/dL    HCT 16.1 (*) 09.6 - 46.0 %    MCV 78.6  78.0 - 100.0 fL    MCH 25.5 (*) 26.0 - 34.0 pg    MCHC 32.4  30.0 - 36.0 g/dL    RDW 04.5  40.9 - 81.1 %    Platelets 379  150 - 400 K/uL   BASIC METABOLIC PANEL     Status: Abnormal   Collection Time   09/23/12  5:45 AM      Component  Value Range Comment  Sodium 135  135 - 145 mEq/L    Potassium 3.3 (*) 3.5 - 5.1 mEq/L    Chloride 100  96 - 112 mEq/L    CO2 25  19 - 32 mEq/L    Glucose, Bld 248 (*) 70 - 99 mg/dL    BUN 4 (*) 6 - 23 mg/dL    Creatinine, Ser 4.09  0.50 - 1.10 mg/dL    Calcium 8.8  8.4 - 81.1 mg/dL    GFR calc non Af Amer >90  >90 mL/min    GFR calc Af Amer >90  >90 mL/min   GLUCOSE, CAPILLARY     Status: Abnormal   Collection Time   09/23/12  6:53 AM      Component Value Range Comment   Glucose-Capillary 219 (*) 70 - 99 mg/dL   GLUCOSE, CAPILLARY     Status: Abnormal   Collection Time   09/23/12  9:15 AM      Component Value Range Comment   Glucose-Capillary 163 (*) 70 - 99 mg/dL   MAGNESIUM     Status: Normal   Collection Time   09/23/12 10:15 AM      Component Value Range Comment   Magnesium 1.8  1.5 - 2.5 mg/dL   GLUCOSE, CAPILLARY     Status: Abnormal   Collection Time   09/23/12 11:36 AM      Component Value Range Comment   Glucose-Capillary 278 (*) 70 - 99 mg/dL    No results found.     Assessment/Plan 1. Left gluteus wound 2. Cellulitis of right foot, s/p debridement 3. DM 4. HTN  Plan: 1. Due to pain, I am currently unable to further debride this wound.  I will try and get an I&D tray up here to anesthetize this area so I can do a better debridement.  It does feel a little fluctuant underneath the eschar as if there may be some purulent drainage underneath.  It does appear the patient has had this wound longer than just Monday given the appearance of the wound.   Agree with antibiotic therapy as this wound does appear to have decreased in size over the last couple of days.  We will follow with you.  Ahlivia Salahuddin E 09/23/2012, 2:39 PM Pager: [phone]

## 2012-09-23 NOTE — Progress Notes (Signed)
Patient with cyst on buttocks, I consulted general surgery to evaluate and treat.  I have not treated this type of cyst in past.

## 2012-09-23 NOTE — Addendum Note (Signed)
Addendum  created 09/23/12 1045 by Jalil Lorusso Marie Leasha Goldberger, CRNA   Modules edited:Anesthesia Flowsheet    

## 2012-09-24 ENCOUNTER — Encounter (HOSPITAL_COMMUNITY): Payer: Self-pay | Admitting: Orthopedic Surgery

## 2012-09-24 ENCOUNTER — Encounter (HOSPITAL_COMMUNITY)
Admission: EM | Disposition: A | Discharge status: Home / Self Care | Payer: Self-pay | Source: Home / Self Care | Attending: Internal Medicine

## 2012-09-24 DIAGNOSIS — I059 Rheumatic mitral valve disease, unspecified: Secondary | ICD-10-CM

## 2012-09-24 DIAGNOSIS — L02619 Cutaneous abscess of unspecified foot: Secondary | ICD-10-CM

## 2012-09-24 DIAGNOSIS — L03119 Cellulitis of unspecified part of limb: Secondary | ICD-10-CM

## 2012-09-24 DIAGNOSIS — L03317 Cellulitis of buttock: Secondary | ICD-10-CM

## 2012-09-24 DIAGNOSIS — A4902 Methicillin resistant Staphylococcus aureus infection, unspecified site: Secondary | ICD-10-CM

## 2012-09-24 HISTORY — PX: TEE WITHOUT CARDIOVERSION: SHX5443

## 2012-09-24 LAB — BASIC METABOLIC PANEL
BUN: 4 mg/dL — ABNORMAL LOW (ref 6–23)
CO2: 24 mEq/L (ref 19–32)
Calcium: 8.5 mg/dL (ref 8.4–10.5)
Creatinine, Ser: 0.51 mg/dL (ref 0.50–1.10)
Glucose, Bld: 254 mg/dL — ABNORMAL HIGH (ref 70–99)

## 2012-09-24 LAB — CBC
HCT: 31.9 % — ABNORMAL LOW (ref 36.0–46.0)
Hemoglobin: 10.5 g/dL — ABNORMAL LOW (ref 12.0–15.0)
MCH: 25.7 pg — ABNORMAL LOW (ref 26.0–34.0)
MCV: 78.2 fL (ref 78.0–100.0)
Platelets: 370 10*3/uL (ref 150–400)
RBC: 4.08 MIL/uL (ref 3.87–5.11)

## 2012-09-24 LAB — URINE CULTURE

## 2012-09-24 LAB — GLUCOSE, CAPILLARY: Glucose-Capillary: 151 mg/dL — ABNORMAL HIGH (ref 70–99)

## 2012-09-24 LAB — VANCOMYCIN, TROUGH: Vancomycin Tr: <5 ug/mL — ABNORMAL LOW (ref 10.0–20.0)

## 2012-09-24 SURGERY — ECHOCARDIOGRAM, TRANSESOPHAGEAL
Anesthesia: Moderate Sedation

## 2012-09-24 MED ORDER — VANCOMYCIN HCL IN DEXTROSE 1-5 GM/200ML-% IV SOLN
1000.0000 mg | Freq: Three times a day (TID) | INTRAVENOUS | Status: DC
  Administered 2012-09-25 – 2012-09-30 (×18): 1000 mg via INTRAVENOUS
  Filled 2012-09-24 (×23): qty 200

## 2012-09-24 MED ORDER — BUTAMBEN-TETRACAINE-BENZOCAINE 2-2-14 % EX AERO
INHALATION_SPRAY | CUTANEOUS | Status: DC | PRN
  Administered 2012-09-24: 2 via TOPICAL

## 2012-09-24 MED ORDER — MIDAZOLAM HCL 5 MG/ML IJ SOLN
INTRAMUSCULAR | Status: AC
  Filled 2012-09-24: qty 2

## 2012-09-24 MED ORDER — FENTANYL CITRATE 0.05 MG/ML IJ SOLN
INTRAMUSCULAR | Status: DC | PRN
  Administered 2012-09-24 (×2): 25 ug via INTRAVENOUS

## 2012-09-24 MED ORDER — MIDAZOLAM HCL 10 MG/2ML IJ SOLN
INTRAMUSCULAR | Status: DC | PRN
  Administered 2012-09-24: 2 mg via INTRAVENOUS
  Administered 2012-09-24: 1 mg via INTRAVENOUS
  Administered 2012-09-24: 2 mg via INTRAVENOUS

## 2012-09-24 MED ORDER — FENTANYL CITRATE 0.05 MG/ML IJ SOLN
INTRAMUSCULAR | Status: AC
  Filled 2012-09-24: qty 2

## 2012-09-24 NOTE — Progress Notes (Signed)
ANTIBIOTIC CONSULT NOTE-PROGRESS NOTE  Pharmacy Consult for Vancomycin Indication: Foot abscess  Hospital Problems Principal Problem:  *Cellulitis of right foot Active Problems:  Diabetes mellitus, type II  Hypertension  Leukocytosis   Allergies Allergies  Allergen Reactions  . Codeine Other (See Comments)    Throat Swelling and itching  . Tramadol Hives and Itching    Patient Measurements: Height: 5\' 3"  (160 cm) Weight: 140 lb (63.504 kg) IBW/kg (Calculated) : 52.4   Vital Signs: BP 133/77  Pulse 100  Temp 100 F (37.8 C) (Oral)  Resp 18  Ht 5\' 3"  (1.6 m)  Wt 140 lb (63.504 kg)  BMI 24.80 kg/m2  SpO2 100%  Labs:  Basename 09/24/12 0445 09/23/12 0545 09/22/12 0700  WBC 16.5* 20.3* 25.9*  HGB 10.5* 11.3* 11.9*  PLT 370 379 362  CREATININE 0.51 0.61 0.51   Estimated Creatinine Clearance: 72.1 ml/min (by C-G formula based on Cr of 0.51). BUN/Cr/glu/ALT/AST/amyl/lip:  4/0.51/--/--/--/--/-- (10/31 0445)   Recent Labs  Basename 09/24/12 1628   VANCOTROUGH <5.0*    Anti-infectives Anti-infectives     Start     Dose/Rate Route Frequency Ordered Stop   09/22/12 0500   vancomycin (VANCOCIN) 750 mg in sodium chloride 0.9 % 150 mL IVPB        750 mg 150 mL/hr over 60 Minutes Intravenous Every 12 hours 09/21/12 1742            Assessment:  Patient currently on Vancomycin for foot abscess.  As per ID note, there is no indication at this time for treatment for endocarditis. Her fever likely secondary to abscess.   Vancomycin trough today reported as < 5 mcg/ml.  All ordered doses have been charted.  Goal of Therapy:   Vancomycin trough level 10-15 mcg/ml  Plan:   Change Vancomycin to 1 gm IV q 8 hours.  Will recheck Vancomycin trough at steady-state.  Laurena Bering, Pharm.D.  09/24/2012 6:20 PM

## 2012-09-24 NOTE — Progress Notes (Signed)
Dr. Collier Bullock paged at [phone] regarding pt's temp of 101.4. Pt complaining of chills and severe pain in left arm where Tetanis shot was delivered a few days ago. Arm is slighly red and a small knot can be felt over the area. Tylenol 650 suppository given. Pt will continue to be monitored.  Salvadore Oxford, RN 09/24/12 [phone]

## 2012-09-24 NOTE — Progress Notes (Signed)
  Echocardiogram Echocardiogram Transesophageal has been performed.  Linda Miller 09/24/2012, 11:17 AM

## 2012-09-24 NOTE — Progress Notes (Signed)
Internal Medicine Teaching Service Attending Note Date: 09/24/2012  Patient name: Linda Miller  Medical record number: [id number]  Date of birth: 1956/08/09    This patient has been seen and discussed with the house staff. Please see their note for complete details. I concur with their findings with the following additions/corrections: Patient was admitted with right foot pain which turned out to be abscess and was debrided by Dr. Lajoyce Corners. Patient was seen by surgery yesterday for I and D of her buttock abscess which has subsided in size since admission and hence could not be debrided. There was a concern on TTE for a vegetation which was confirmed by TEE but in the setting of no fevers and no positive blood cultures, endocarditis is not a consideration any more. I believe that patient can be safely discharged home on a total of 10 days of antibiotics. Patient will need to be seen in the clinic for follow up as [facility]. Patient does have difficult social situation which may hamper/delay her discharge.  Lars Mage 09/24/2012, 8:51 PM

## 2012-09-24 NOTE — CV Procedure (Signed)
Procedure: TEE  Sedation: Versed 5 mg, Fentanyl 50 mcg   Indication: r/o endocarditis  Findings: There was a small mass versus thickening at the tip of the anterior mitral leaflet with mild to moderate MR.  Normal LV systolic function, EF 60%.  No vegetation on the AoV, TV, or PV.    Conclusion: In the right clinical setting, the MV finding could represent endocarditis (bacteremia, fever).    No complications.

## 2012-09-24 NOTE — H&P (View-Only) (Signed)
Medical Student Daily Progress Note  Subjective: Mrs. Provencal was seen this afternoon and examined bedside. Her R foot is bandaged and painful to deep touch in the entire bandaged area, from ankle to toes, as well when she moves it. She reports that the abscess on her L buttock hurts more than yesterday and she is trying to accommodate for it by not lying on that side. Her pain is rated 7/10 compared to the pain she felt immediately after surgery. She denies N/V/HA/ABD Pain at this time.  Objective: Vital signs in last 24 hours: Filed Vitals:   09/23/12 0600 09/23/12 0900 09/23/12 0915 09/23/12 0945  BP: 122/64 126/78 121/72 119/70  Pulse: 95 107    Temp: 99.1 F (37.3 C) 97.5 F (36.4 C)  98.2 F (36.8 C)  TempSrc:      Resp: 20 18    Height:      Weight:      SpO2: 100% 100%  95%   Weight change:   Intake/Output Summary (Last 24 hours) at 09/23/12 1331 Last data filed at 09/23/12 0852  Gross per 24 hour  Intake    640 ml  Output     25 ml  Net    615 ml   Physical Exam: BP 119/70  Pulse 107  Temp 98.2 F (36.8 C) (Oral)  Resp 18  Ht 5' 3" (1.6 m)  Wt 63.504 kg (140 lb)  BMI 24.80 kg/m2  SpO2 95%  General Appearance:   Alert, cooperative, appears stated age  Positive for:  distressed when discussing her pain  Head:    Normocephalic, atraumatic   Eyes:    EOM's intact both eyes   Ears:    Normal external ear canals, both ears   Nose:   Nares normal, septum midline, no drainage tenderness  Throat:   Lips nd tongue normal; teeth normal  Neck:   Supple, symmetrical, trachea midline, no adenopathy;  thyroid: No obvious enlargement  Back:     Symmetric, no curvature, ROM normal   Lungs:     Clear to auscultation bilaterally, respirations unlabored   Chest Wall:    No tenderness or deformity    Heart:    Regular rate and rhythm, no murmur noticed at this time  Abdomen:     Soft, non-tender, bowel sounds active all four quadrants,    no masses, no organomegaly    Extremities:   L extremity normal, atraumatic, no cyanosis or edema  Positive for:  R extremity is still mildly swollen compared to left, warm to touch and warmer than left. Right foot still has compression bandage from OR.  Abscess on Left buttock is round, approximately 12mm in diameter, with black eschar at the center of and red, weeping margins. Purulent exudate is draining from the circumference.  Immediately below this is a 3mm, skin colored welt that is not purulent or weeping at this time.  Pulses:   2+ and symmetric all extremities  Skin:   Skin color, texture, turgor normal, no rashes or lesions, with exception of areas noted above in exam.  Neurologic:   AAOx3, normal cognition and affect       Lab Results: Basic Metabolic Panel:  Lab 09/23/12 1015 09/23/12 0545 09/22/12 0700  NA -- 135 135  K -- 3.3* 3.4*  CL -- 100 102  CO2 -- 25 22  GLUCOSE -- 248* 333*  BUN -- 4* 5*  CREATININE -- 0.61 0.51  CALCIUM -- 8.8 8.5  MG 1.8 -- --    PHOS -- -- --   CBC:  Lab 09/23/12 0545 09/22/12 0700 09/21/12 1234  WBC 20.3* 25.9* --  NEUTROABS -- -- 27.8*  HGB 11.3* 11.9* --  HCT 34.9* 36.0 --  MCV 78.6 77.6* --  PLT 379 362 --    Urinalysis:     . ampicillin-sulbactam (UNASYN) IV  3 g Intravenous Q6H  . chlorhexidine  60 mL Topical Once  . Chlorhexidine Gluconate Cloth  6 each Topical Q0600  . enoxaparin (LOVENOX) injection  40 mg Subcutaneous Q24H  . hydrochlorothiazide  25 mg Oral Q breakfast  . HYDROmorphone      . HYDROmorphone      . influenza  inactive virus vaccine  0.5 mL Intramuscular Tomorrow-1000  . insulin aspart  0-9 Units Subcutaneous TID AC & HS  . insulin aspart protamine-insulin aspart  10 Units Subcutaneous BID WC  . ketorolac  30 mg Intravenous Once  . mupirocin ointment  1 application Nasal BID  . nicotine  14 mg Transdermal Daily  . pneumococcal 23 valent vaccine  0.5 mL Intramuscular Tomorrow-1000  . potassium chloride  10 mEq Intravenous Q1  Hr x 4  . vancomycin  750 mg Intravenous Q12H   Continuous Infusions:    . sodium chloride 50 mL/hr at 09/23/12 1203   PRN Meds:.acetaminophen, acetaminophen, alum & mag hydroxide-simeth, HYDROmorphone (DILAUDID) injection, ondansetron (ZOFRAN) IV, ondansetron, oxyCODONE-acetaminophen, promethazine, DISCONTD:  HYDROmorphone (DILAUDID) injection, DISCONTD:  HYDROmorphone (DILAUDID) injection, DISCONTD: sodium chloride irrigation Assessment/Plan: Principal Problem:  *Cellulitis of right foot Active Problems:  Diabetes mellitus, type II  Hypertension  Leukocytosis   LOS: 2 days   This is a Medical Student Note.  The care of the patient was discussed with Dr. Dyer Klug and the assessment and plan formulated with their assistance.  Please see their attached note for official documentation of the daily encounter.  Brown, Daniel L 09/23/2012, 1:31 PM  6:08 PM Resident Co-sign Daily Note: I have seen the patient and reviewed the daily progress note by Brown Daniel MS 3 and discussed the care of the patient with them.  See below for documentation of my findings, assessment, and plans.  Subjective: Assessed the patient after she had just returned from surgery this morning. She had I&D performed on her right foot Objective: Vital signs in last 24 hours: Filed Vitals:   09/23/12 0900 09/23/12 0915 09/23/12 0945 09/23/12 1400  BP: 126/78 121/72 119/70 120/70  Pulse: 107   100  Temp: 97.5 F (36.4 C)  98.2 F (36.8 C) 98 F (36.7 C)  TempSrc:    Oral  Resp: 18   18  Height:      Weight:      SpO2: 100%  95% 96%   Physical Exam: General appearance: alert, cooperative, severe distress and in severe pain and tearful Lungs: clear to auscultation bilaterally Heart: systolic murmur: holosystolic 2/6, blowing at lower left sternal border Abdomen: soft, non-tender; bowel sounds normal; no masses,  no organomegaly Pulses: 2+ and symmetric Skin: Skin color, texture, turgor normal. No rashes  or lesions or areas of previous infection involving the right upper extremity. Patient also has a an abscess on the left buttock, measuring about 5 cm in diameter. On the gentle expression, there is purulent discharge.  Continuous Infusions:   . sodium chloride 50 mL/hr at 09/23/12 1203   PRN Meds:.acetaminophen, acetaminophen, alum & mag hydroxide-simeth, HYDROmorphone (DILAUDID) injection, ondansetron (ZOFRAN) IV, ondansetron, oxyCODONE-acetaminophen, promethazine, DISCONTD:  HYDROmorphone (DILAUDID) injection, DISCONTD:  HYDROmorphone (DILAUDID) injection, DISCONTD:   sodium chloride irrigation Assessment/Plan: Right foot cellulitis with possible disseminated infection. Status post irrigation debridement excision of the foot this morning. A partial calcaneal excision was also performed. Specimens were sent for culture.  In this patient with uncontrolled, type 2 diabetes, and history of recurrent skin infections with the most vaginal abscess that required I&D, there is a possibility that she has disseminated infection that is seeding into the foot hematogenously. She is also noted to have a murmur in the right sternal border of 2/6 and holosystolic. There are no other stigmata of infective endocarditis. Transthoracic echocardiogram revealed a possible small vegetation, and that tip of mitral valve. EKG-normal. The patient tested positive on surgical PCR screening for MRSA. Plan  -This is day #4 of vancomycin, and day. #3 of Unasyn  -Blood cultures-NGTD. We will another Blood cx tomorrow which will be 48hrs apart from the first one. -Tissue culture-pending - Will continue to monitor WBC. It has improved from 31 on admission to 20 currently. -A TEE will be performed tomorrow -buttock wound did not yield pus on aspiration using a 18G needle. No surgical intervention was recommended by surgery. To continue with antibiotics.  Uncontrolled Diabetes: The patient's home medications for diabetes include  glimepiride and metformin. She also reports that she is been off medication for a long time due to cost. HbA1C is 15.6%.  Plan  -switch to Insulin therapy with 10IU of 70/30 Novolog bid in addition to meal coverage insulin. This will be a more affordable option compared to Lantus.  -Donna is assisting setting her up with IM clinic - She will be set up with internal medicine clinic for outpatient follow up.   Hypertension: Patient's home medications include hydrochlorothiazide. On admission her blood pressure was 110/86. She reports compliance with this medication.  Plan  -Will continue with home medications.          LOS: 2 days   Kieara Schwark 09/23/2012, 6:14 PM  

## 2012-09-24 NOTE — Progress Notes (Signed)
Pt being unsteady on her feet and trying to get up without calling for assistance. Reeducated the patient and kept the bed alarm on. Offered a camera room close to the nursing station, but pt refused to move from the current room. Will keep close monitoring.

## 2012-09-24 NOTE — Progress Notes (Signed)
Subjective:    Interval Events:   She report that her pain is better after the surgery. However, she now has pain involving her left upper arm especially around the vaccination site for Tdap. She has some chills as well. No nausea no vomiting.    Objective:    Vital Signs:   Temp:  [98 F (36.7 C)-102.4 F (39.1 C)] 102.4 F (39.1 C) (10/31 0930) Pulse Rate:  [100-104] 101  (10/31 0930) Resp:  [15-24] 22  (10/31 1100) BP: (119-152)/(51-83) 140/77 mmHg (10/31 1100) SpO2:  [93 %-100 %] 100 % (10/31 1100) Last BM Date: 09/22/12   Weights: 24-hour Weight change:   Filed Weights   09/21/12 1748  Weight: 140 lb (63.504 kg)     Intake/Output:  No intake or output data in the 24 hours ending 09/24/12 1106     Physical Exam: General appearance: alert, cooperative, severe distress and in severe pain and tearful  Lungs: clear to auscultation bilaterally  Heart: systolic murmur: holosystolic 2/6, blowing at lower left sternal border  Abdomen: soft, non-tender; bowel sounds normal; no masses, no organomegaly  Pulses: 2+ and symmetric  Skin: Skin color, texture, turgor normal. No rashes or lesions or areas of previous infection involving the right upper extremity. The eschar over the buttock wound is looking better and there is no pus expressed.  Extremities: R foot wound not opened but the dressing look clean and dry. The leg is slightly tender and warmer compared to the left. The toes are appearing normal without increased pain. Neurovascular exam is normal. The left upper extremity has a tender area that erythematous and warm to touch at the lateral proximal 1/3 of upper arm . It slightly swollen.   Labs: Basic Metabolic Panel:  Lab 09/24/12 1610 09/23/12 1015 09/23/12 0545 09/22/12 0700 09/21/12 1234  NA 135 -- 135 135 136  K 3.8 -- 3.3* 3.4* 4.0  CL 101 -- 100 102 101  CO2 24 -- 25 22 24   GLUCOSE 254* -- 248* 333* 259*  BUN 4* -- 4* 5* 6  CREATININE 0.51 -- 0.61 0.51  0.60  CALCIUM 8.5 -- 8.8 8.5 --  MG -- 1.8 -- -- --  PHOS -- -- -- -- --    Liver Function Tests:  Lab 09/22/12 0700  AST 7  ALT 5  ALKPHOS 75  BILITOT 0.2*  PROT 6.4  ALBUMIN 2.5*   No results found for this basename: LIPASE:5,AMYLASE:5 in the last 168 hours No results found for this basename: AMMONIA:3 in the last 168 hours  CBC:  Lab 09/24/12 0445 09/23/12 0545 09/22/12 0700 09/21/12 1234  WBC 16.5* 20.3* 25.9* 31.9*  NEUTROABS -- -- -- 27.8*  HGB 10.5* 11.3* 11.9* 13.2  HCT 31.9* 34.9* 36.0 39.1  MCV 78.2 78.6 77.6* 77.9*  PLT 370 379 362 352    Cardiac Enzymes: No results found for this basename: CKTOTAL:5,CKMB:5,CKMBINDEX:5,TROPONINI:5 in the last 168 hours  BNP: No components found with this basename: POCBNP:5  CBG:  Lab 09/24/12 0643 09/23/12 2245 09/23/12 1629 09/23/12 1136 09/23/12 0915  GLUCAP 226* 232* 223* 278* 163*    Coagulation Studies: No results found for this basename: LABPROT:5,INR:5 in the last 72 hours  Microbiology: Results for orders placed during the hospital encounter of 09/21/12  CULTURE, BLOOD (ROUTINE X 2)     Status: Normal (Preliminary result)   Collection Time   09/22/12  7:00 AM      Component Value Range Status Comment   Specimen Description  BLOOD LEFT ARM   Final    Special Requests BOTTLES DRAWN AEROBIC AND ANAEROBIC 10CC   Final    Culture  Setup Time 09/22/2012 13:44   Final    Culture     Final    Value:        BLOOD CULTURE RECEIVED NO GROWTH TO DATE CULTURE WILL BE HELD FOR 5 DAYS BEFORE ISSUING A FINAL NEGATIVE REPORT   Report Status PENDING   Incomplete   CULTURE, BLOOD (ROUTINE X 2)     Status: Normal (Preliminary result)   Collection Time   09/22/12  7:05 AM      Component Value Range Status Comment   Specimen Description BLOOD LEFT ARM   Final    Special Requests BOTTLES DRAWN AEROBIC AND ANAEROBIC 10CC   Final    Culture  Setup Time 09/22/2012 13:44   Final    Culture     Final    Value:        BLOOD  CULTURE RECEIVED NO GROWTH TO DATE CULTURE WILL BE HELD FOR 5 DAYS BEFORE ISSUING A FINAL NEGATIVE REPORT   Report Status PENDING   Incomplete   URINE CULTURE     Status: Normal (Preliminary result)   Collection Time   09/22/12  2:07 PM      Component Value Range Status Comment   Specimen Description URINE, CLEAN CATCH   Final    Special Requests NONE   Final    Culture  Setup Time 09/22/2012 16:04   Final    Colony Count >=100,000 COLONIES/ML   Final    Culture GRAM NEGATIVE RODS   Final    Report Status PENDING   Incomplete   MRSA PCR SCREENING     Status: Normal   Collection Time   09/22/12  5:20 PM      Component Value Range Status Comment   MRSA by PCR NEGATIVE  NEGATIVE Final   SURGICAL PCR SCREEN     Status: Abnormal   Collection Time   09/23/12  2:00 AM      Component Value Range Status Comment   MRSA, PCR POSITIVE (*) NEGATIVE Final    Staphylococcus aureus POSITIVE (*) NEGATIVE Final   CULTURE, ROUTINE-ABSCESS     Status: Normal (Preliminary result)   Collection Time   09/23/12  8:45 AM      Component Value Range Status Comment   Specimen Description ABSCESS RIGHT FOOT   Final    Special Requests RIGHT HEEL PT ON ZOSYN AND VANCO   Final    Gram Stain     Final    Value: NO WBC SEEN     RARE SQUAMOUS EPITHELIAL CELLS PRESENT     RARE GRAM POSITIVE COCCI IN PAIRS   Culture PENDING   Incomplete    Report Status PENDING   Incomplete     Other results: EKG Results: Normal results on 10/30    Medications:    Infusions:    . sodium chloride 50 mL/hr at 09/23/12 1203     Scheduled Medications:    . ampicillin-sulbactam (UNASYN) IV  3 g Intravenous Q6H  . Chlorhexidine Gluconate Cloth  6 each Topical Q0600  . enoxaparin (LOVENOX) injection  40 mg Subcutaneous Q24H  . hydrochlorothiazide  25 mg Oral Q breakfast  . HYDROmorphone      . HYDROmorphone      . insulin aspart  0-9 Units Subcutaneous TID AC & HS  . insulin aspart protamine-insulin aspart  10 Units  Subcutaneous BID WC  . mupirocin ointment  1 application Nasal BID  . nicotine  14 mg Transdermal Daily  . potassium chloride  10 mEq Intravenous Q1 Hr x 4  . vancomycin  750 mg Intravenous Q12H  . DISCONTD: ketorolac  30 mg Intravenous Once     PRN Medications: acetaminophen, acetaminophen, alum & mag hydroxide-simeth, butamben-tetracaine-benzocaine, fentaNYL, HYDROmorphone (DILAUDID) injection, midazolam, ondansetron (ZOFRAN) IV, ondansetron, oxyCODONE-acetaminophen, promethazine   Assessment/ Plan:   Linda Miller is a 56yo female who presented yesterday for R foot pain. Overall she is still in significant pain and unable to walk on her foot. She has an elevated WBC count had other purulent infections recently on her buttocks and labia. Her diabetes is not well controlled, and she has a previously undiagnosed 1/6 systolic heart murmur of unclear etiology.   Right foot cellulitis and Left Buttock abcess. Status post irrigation debridement excision of the foot on 10/30. A partial calcaneal excision was also performed. Abscess gram stain reports gram positive but cultures are still pending. In this patient with uncontrolled, type 2 diabetes, and history of recurrent skin infections with the most vaginal abscess that required I&D, there is a possibility that she has disseminated infection that is seeding into the foot hematogenously.  Plan  - continue with wound care  - she has had PT  -who have recommended HH PT and Rolling Walker with 5 wheels  - Left buttock wound did not yield pus on aspiration using a 18G needle. No surgical intervention was recommended by surgery.  - To continue with antibiotics and wound care.  - On pain control with Dilaudid  Possible Infective Endocarditis: She was found to have a murmur in the right sternal border of 2/6 and holosystolic. There are no other stigmata of infective endocarditis. Transthoracic echocardiogram revealed a possible small vegetation, and that  tip of mitral valve. She does not quite meet the criteria for a diagnosis of IE - only one 1 major(ECHO) and 1 minor [facility] (fever) criteria for IE. TEE is pending. Patient started spiking fevers since yesterday with Temp 102.4 today. It is possible that this fever is 2/2 vaccine reaction. WBC have actually improved from 31.5 on admission to 16.5 today. I have discussed with Dr Ninetta Lights and he recommends that we can discontinue the Unasyn give gram positive organisms on gram stain of the pus. There are possibly streptococcal or staph. We can do vanc alone.  Plan  - Initial EKG-normal.  - This is day #5 of vancomycin - she will need at least 6 weeks of treatment - D/c unasyn as recommended by Dr Ninetta Lights - repeat Blood cx today. Initial blood cx no growth after 3days -Tissue culture-pending  - Will continue to monitor WBC.  -Getting TEE today  Lab 09/24/12 0445 09/23/12 0545 09/22/12 0700 09/21/12 1234  WBC 16.5* 20.3* 25.9* 31.9*    Uncontrolled Diabetes: The patient's home medications for diabetes include glimepiride and metformin. She also reports that she is been off medication for a long time due to cost. HbA1C is 15.6%.  Plan  -switch to Insulin therapy with 10IU of 70/30 Novolog bid in addition to meal coverage insulin. This will be a more affordable option compared to Lantus.  -[name] is assisting setting her up with IM clinic  - She will be set up with internal medicine clinic for outpatient follow up.   Hypertension: Patient's home medications include hydrochlorothiazide. On admission her blood pressure was 110/86. She reports compliance with  this medication.  Plan  -Will continue with home medications.  Length of Stay: 3 days   Signed by:  Dow Adolph PGY-I, Internal Medicine Pager [phone] 09/24/2012, 11:06 AM
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
## 2012-09-24 NOTE — Progress Notes (Signed)
[facility]  Patient Status: New  [facility] is providing the following services: PT and MSW  If patient discharges after hours, please call [phone].   Linda Miller 09/24/2012, 3:59 PM

## 2012-09-24 NOTE — Interval H&P Note (Signed)
History and Physical Interval Note:  09/24/2012 10:35 AM  Linda Miller  has presented today for surgery, with the diagnosis of r/o vegetation  The various methods of treatment have been discussed with the patient and family. After consideration of risks, benefits and other options for treatment, the patient has consented to  Procedure(s) (LRB) with comments: TRANSESOPHAGEAL ECHOCARDIOGRAM (TEE) (N/A) as a surgical intervention .  The patient's history has been reviewed, patient examined, no change in status, stable for surgery.  I have reviewed the patient's chart and labs.  Questions were answered to the patient's satisfaction.     Chloe Miyoshi [employer]

## 2012-09-24 NOTE — Consult Note (Signed)
[facility]     Reason for Consult:? endocarditis    Referring Physician: Dr. Eben Burow  Principal Problem:  *Cellulitis of right foot Active Problems:  Diabetes mellitus, type II  Hypertension  Leukocytosis      . Chlorhexidine Gluconate Cloth  6 each Topical Q0600  . enoxaparin (LOVENOX) injection  40 mg Subcutaneous Q24H  . hydrochlorothiazide  25 mg Oral Q breakfast  . HYDROmorphone      . HYDROmorphone      . insulin aspart  0-9 Units Subcutaneous TID AC & HS  . insulin aspart protamine-insulin aspart  10 Units Subcutaneous BID WC  . mupirocin ointment  1 application Nasal BID  . nicotine  14 mg Transdermal Daily  . potassium chloride  10 mEq Intravenous Q1 Hr x 4  . vancomycin  750 mg Intravenous Q12H  . DISCONTD: ampicillin-sulbactam (UNASYN) IV  3 g Intravenous Q6H  . DISCONTD: ketorolac  30 mg Intravenous Once    Recommendations: Treatment for foot abscess, continue vancomycin for now pending culture   Assessment: She does not meet criteria for endocarditis. Her TEE only shows some thickening on the valve (official report pending) and has no positive blood cultures.  Fever is the only other criteria.  Therefore, no indication at this time for treatment for endocarditis.   Fever likely secondary to abscess.    Antibiotics: vancomycin  HPI: Linda Miller is a 56 y.o. female with poorly controlled diabetes who presented on 10/29 with 1 week of abscess on foot.  There was no preceeding trauma or foreign body in her foot.  Her last Hgb A1C was 15.  She was seen by Dr. Lajoyce Corners who did I and D and noted necrotic tissue and required partial calcaneal excision.  She was also noted to have a murmur and a TTE was obtained and then a TEE which showed some thickening of the mitral valve.  She also had a boil on her buttock and was seen by surgery but was no fluid was noted with needle aspiration.     Review of Systems: Pertinent items are noted in  HPI.  Past Medical History  Diagnosis Date  . Diabetes mellitus   . Hypertension   . High cholesterol     History  Substance Use Topics  . Smoking status: Current Every Day Smoker  . Smokeless tobacco: Not on file  . Alcohol Use: Yes    History reviewed. No pertinent family history. Allergies  Allergen Reactions  . Codeine Other (See Comments)    Throat Swelling and itching  . Tramadol Hives and Itching    OBJECTIVE: Blood pressure 133/77, pulse 100, temperature 100 F (37.8 C), temperature source Oral, resp. rate 18, height 5\' 3"  (1.6 m), weight 140 lb (63.504 kg), SpO2 100.00%. General: Awake, alert, nad Skin: foot with incision, stitches Lungs: CTA B Cor: RRR SEM Abdomen: soft, ntnd, +bs  Microbiology: Recent Results (from the past 240 hour(s))  CULTURE, BLOOD (ROUTINE X 2)     Status: Normal (Preliminary result)   Collection Time   09/22/12  7:00 AM      Component Value Range Status Comment   Specimen Description BLOOD LEFT ARM   Final    Special Requests BOTTLES DRAWN AEROBIC AND ANAEROBIC 10CC   Final    Culture  Setup Time 09/22/2012 13:44   Final    Culture     Final    Value:        BLOOD  CULTURE RECEIVED NO GROWTH TO DATE CULTURE WILL BE HELD FOR 5 DAYS BEFORE ISSUING A FINAL NEGATIVE REPORT   Report Status PENDING   Incomplete   CULTURE, BLOOD (ROUTINE X 2)     Status: Normal (Preliminary result)   Collection Time   09/22/12  7:05 AM      Component Value Range Status Comment   Specimen Description BLOOD LEFT ARM   Final    Special Requests BOTTLES DRAWN AEROBIC AND ANAEROBIC 10CC   Final    Culture  Setup Time 09/22/2012 13:44   Final    Culture     Final    Value:        BLOOD CULTURE RECEIVED NO GROWTH TO DATE CULTURE WILL BE HELD FOR 5 DAYS BEFORE ISSUING A FINAL NEGATIVE REPORT   Report Status PENDING   Incomplete   URINE CULTURE     Status: Normal   Collection Time   09/22/12  2:07 PM      Component Value Range Status Comment   Specimen  Description URINE, CLEAN CATCH   Final    Special Requests NONE   Final    Culture  Setup Time 09/22/2012 16:04   Final    Colony Count >=100,000 COLONIES/ML   Final    Culture ESCHERICHIA COLI   Final    Report Status 09/24/2012 FINAL   Final    Organism ID, Bacteria ESCHERICHIA COLI   Final   MRSA PCR SCREENING     Status: Normal   Collection Time   09/22/12  5:20 PM      Component Value Range Status Comment   MRSA by PCR NEGATIVE  NEGATIVE Final   SURGICAL PCR SCREEN     Status: Abnormal   Collection Time   09/23/12  2:00 AM      Component Value Range Status Comment   MRSA, PCR POSITIVE (*) NEGATIVE Final    Staphylococcus aureus POSITIVE (*) NEGATIVE Final   CULTURE, ROUTINE-ABSCESS     Status: Normal (Preliminary result)   Collection Time   09/23/12  8:45 AM      Component Value Range Status Comment   Specimen Description ABSCESS RIGHT FOOT   Final    Special Requests RIGHT HEEL PT ON ZOSYN AND VANCO   Final    Gram Stain     Final    Value: NO WBC SEEN     RARE SQUAMOUS EPITHELIAL CELLS PRESENT     RARE GRAM POSITIVE COCCI IN PAIRS   Culture PENDING   Incomplete    Report Status PENDING   Incomplete   ANAEROBIC CULTURE     Status: Normal (Preliminary result)   Collection Time   09/23/12  8:45 AM      Component Value Range Status Comment   Specimen Description ABSCESS RIGHT FOOT   Final    Special Requests RIGHT HEEL PT ON ZOSYN AND VANCO   Final    Gram Stain PENDING   Incomplete    Culture     Final    Value: NO ANAEROBES ISOLATED; CULTURE IN PROGRESS FOR 5 DAYS   Report Status PENDING   Incomplete     Staci Righter, MD [facility] [phone] pager  [phone] cell 09/24/2012, 5:22 PM

## 2012-09-24 NOTE — Evaluation (Signed)
Physical Therapy Evaluation Patient Details Name: Linda Miller MRN: [id number] DOB: Feb 07, 1956 Today's Date: 09/24/2012 Time: 7829-5621 PT Time Calculation (min): 26 min  PT Assessment / Plan / Recommendation Clinical Impression  Pt is a 56 y/o female admitted s/p I & D right heel along with the below PT problem list. Pt would benefit from acute PT to maximize independence and facilitate d/c home with HHPT.    PT Assessment  Patient needs continued PT services    Follow Up Recommendations  Home health PT    Does the patient have the potential to tolerate intense rehabilitation      Barriers to Discharge None      Equipment Recommendations  Rolling walker with 5" wheels    Recommendations for Other Services     Frequency Min 3X/week    Precautions / Restrictions Precautions Precautions: Fall Restrictions Weight Bearing Restrictions: Yes RLE Weight Bearing: Non weight bearing   Pertinent Vitals/Pain 5/10 in right LE. Pt repositioned and RN aware.      Mobility  Bed Mobility Bed Mobility: Supine to Sit;Sit to Supine Supine to Sit: 6: Modified independent (Device/Increase time) Sit to Supine: 6: Modified independent (Device/Increase time) Transfers Transfers: Sit to Stand;Stand to Sit (2 trials.) Sit to Stand: 5: Supervision;From bed;From chair/3-in-1 Stand to Sit: 5: Supervision;To bed;To chair/3-in-1 Details for Transfer Assistance: Verbal cues for safest hand placement and NWBing right LE. Ambulation/Gait Ambulation/Gait Assistance: 4: Min guard Ambulation Distance (Feet): 200 Feet Assistive device: Rolling walker Ambulation/Gait Assistance Details: Guarding for balance with cues throughout to maintain NWBing right LE. Pt with tendency to perform TTWBing through right toes only. Gait Pattern: Step-to pattern;Decreased stride length;Trunk flexed Stairs: No Wheelchair Mobility Wheelchair Mobility: No    Shoulder Instructions     Exercises     PT  Diagnosis: Difficulty walking;Acute pain  PT Problem List: Decreased strength;Decreased activity tolerance;Decreased balance;Decreased mobility;Decreased knowledge of use of DME;Pain PT Treatment Interventions: DME instruction;Gait training;Stair training;Functional mobility training;Therapeutic activities;Balance training;Patient/family education   PT Goals Acute Rehab PT Goals PT Goal Formulation: With patient Time For Goal Achievement: 10/01/12 Potential to Achieve Goals: Good Pt will go Sit to Stand: with modified independence PT Goal: Sit to Stand - Progress: Goal set today Pt will go Stand to Sit: with modified independence PT Goal: Stand to Sit - Progress: Goal set today Pt will Ambulate: >150 feet;with modified independence;with least restrictive assistive device PT Goal: Ambulate - Progress: Goal set today Pt will Go Up / Down Stairs: 6-9 stairs;with modified independence;with least restrictive assistive device PT Goal: Up/Down Stairs - Progress: Goal set today  Visit Information  Last PT Received On: 09/24/12 Assistance Needed: +1    Subjective Data  Subjective: "It's starting to feel much better." Patient Stated Goal: Go home.   Prior Functioning  Home Living Lives With: Alone Available Help at Discharge: Family;Available PRN/intermittently Type of Home: House Home Access: Stairs to enter [employer] of Steps: 6 Entrance Stairs-Rails: Can reach both Home Layout: One level Home Adaptive Equipment: None Prior Function Level of Independence: Independent Able to Take Stairs?: Yes Driving: Yes Vocation: Full time employment Communication Communication: No difficulties Dominant Hand: Right    Cognition  Overall Cognitive Status: Appears within functional limits for tasks assessed/performed Arousal/Alertness: Awake/alert Orientation Level: Appears intact for tasks assessed Behavior During Session: [facility] for tasks performed    Extremity/Trunk Assessment  Right Upper Extremity Assessment RUE ROM/Strength/Tone: Within functional levels RUE Sensation: WFL - Light Touch RUE Coordination: WFL - gross motor Left  Upper Extremity Assessment LUE ROM/Strength/Tone: Within functional levels LUE Sensation: WFL - Light Touch LUE Coordination: WFL - gross motor Right Lower Extremity Assessment RLE ROM/Strength/Tone: Within functional levels RLE Sensation: WFL - Light Touch RLE Coordination: WFL - gross motor Left Lower Extremity Assessment LLE ROM/Strength/Tone: Within functional levels LLE Sensation: WFL - Light Touch LLE Coordination: WFL - gross motor Trunk Assessment Trunk Assessment: Normal   Balance Balance Balance Assessed: No  End of Session PT - End of Session Equipment Utilized During Treatment: Gait belt Activity Tolerance: Patient tolerated treatment well Patient left: in bed;with bed alarm set;with call bell/phone within reach Nurse Communication: Mobility status;Other (comment) (Need for post-op shoe for right foot.)  GP     Maxwell Martorano M 09/24/2012, 8:48 AM  09/24/2012 Cephus Shelling, PT, DPT [phone]

## 2012-09-25 ENCOUNTER — Encounter (HOSPITAL_COMMUNITY): Payer: Self-pay | Admitting: Cardiology

## 2012-09-25 ENCOUNTER — Encounter (HOSPITAL_COMMUNITY): Payer: Self-pay

## 2012-09-25 DIAGNOSIS — D72829 Elevated white blood cell count, unspecified: Secondary | ICD-10-CM

## 2012-09-25 LAB — CBC
Hemoglobin: 10.8 g/dL — ABNORMAL LOW (ref 12.0–15.0)
MCH: 25.4 pg — ABNORMAL LOW (ref 26.0–34.0)
MCHC: 32.3 g/dL (ref 30.0–36.0)
MCV: 78.6 fL (ref 78.0–100.0)
Platelets: 390 10*3/uL (ref 150–400)

## 2012-09-25 LAB — BASIC METABOLIC PANEL
CO2: 30 mEq/L (ref 19–32)
Calcium: 8.6 mg/dL (ref 8.4–10.5)
Creatinine, Ser: 0.55 mg/dL (ref 0.50–1.10)
GFR calc non Af Amer: >90 mL/min (ref >90)
Glucose, Bld: 318 mg/dL — ABNORMAL HIGH (ref 70–99)
Sodium: 136 mEq/L (ref 135–145)

## 2012-09-25 LAB — GLUCOSE, CAPILLARY
Glucose-Capillary: 241 mg/dL — ABNORMAL HIGH (ref 70–99)
Glucose-Capillary: 255 mg/dL — ABNORMAL HIGH (ref 70–99)
Glucose-Capillary: 204 mg/dL — ABNORMAL HIGH (ref 70–99)

## 2012-09-25 MED ORDER — INSULIN PEN STARTER KIT
1.0000 | Freq: Once | Status: DC
  Filled 2012-09-25: qty 1

## 2012-09-25 MED ORDER — OXYCODONE-ACETAMINOPHEN 5-325 MG PO TABS: 1.0000 | ORAL_TABLET | ORAL | Status: DC | PRN

## 2012-09-25 MED ORDER — BD GETTING STARTED TAKE HOME KIT: 1/2ML X 30G SYRINGES
1.0000 | Freq: Once | Status: DC
  Filled 2012-09-25: qty 1

## 2012-09-25 MED ORDER — INSULIN ASPART PROT & ASPART (70-30 MIX) 100 UNIT/ML ~~LOC~~ SUSP
14.0000 [IU] | Freq: Two times a day (BID) | SUBCUTANEOUS | Status: DC
  Administered 2012-09-26 – 2012-09-30 (×10): 14 [IU] via SUBCUTANEOUS
  Filled 2012-09-25: qty 3

## 2012-09-25 MED ORDER — OXYCODONE-ACETAMINOPHEN 5-325 MG PO TABS
1.0000 | ORAL_TABLET | ORAL | Status: DC | PRN
  Administered 2012-09-25: 1 via ORAL
  Administered 2012-09-25 – 2012-09-30 (×9): 2 via ORAL
  Filled 2012-09-25 (×7): qty 2
  Filled 2012-09-25: qty 1
  Filled 2012-09-25 (×4): qty 2

## 2012-09-25 NOTE — Progress Notes (Signed)
Note CBGs variable. Patient reports she doesn't like the food here so has been drinking juice and eating graham crackers and raisins as snacks and eating only some of her meals.  Encouraged increased protein intake and less high carb snacks. Will request bedtime snack with protein and dietitian referral.

## 2012-09-25 NOTE — Progress Notes (Signed)
Clinical Social Work-CSW met with pt at bedside to discuss financial resources-CSW provided pt with emergency financial resources (starting with DSS) and empowered pt to begin creating a plan for d/c -CSW has contacted pastoral care with regards to emergency financial assistance; Pastoral Care Director out for the weekend bust CSW will f/u on MondayJodean Lima, [phone]

## 2012-09-25 NOTE — Progress Notes (Signed)
Resident Co-sign Daily Note: I have seen the patient and reviewed the daily progress note by Malachi Carl MS 3 and discussed the care of the patient with them.  See below for documentation of my findings, assessment, and plans.  Subjective: She complained of pain in her right foot this morning. The site of her Tdap vaccination is also painful. The abscess in her left buttocks, is painful at times but not worse than before.   She denies headache, shortness of breath, chest pain, dysuria, or constipation.  Objective: Vital signs in last 24 hours: Filed Vitals:   09/25/12 1000 09/25/12 1436 09/25/12 1620 09/25/12 1800  BP: 130/76  140/82   Pulse: 90  98   Temp: 98.8 F (37.1 C) 98.4 F (36.9 C) 98.1 F (36.7 C) 98.1 F (36.7 C)  TempSrc: Oral Oral Oral Oral  Resp:   19   Height:      Weight:      SpO2: 100%  98%    Physical Exam: Vitals reviewed. General: resting in bed, in NAD HEENT: no scleral icterus Cardiac: RRR, no rubs, murmurs or gallops Pulm: clear to auscultation bilaterally, no wheezes, rales, or rhonchi Abd: soft, nontender, nondistended, BS present Ext: warm and well perfused, no pedal edema. Right foot covered with bandage that is d/c/i.  Skin: a left buttocks non-fluctuant abscess is noted, ~8cm in diameter with central granulation tissue and no purulent discharge or surrounding erythema noted. Her left arm, at the deltoid area has a ~15cm erythematous, edematous, and warm area c/w the site of her Tdap vaccination.  Neuro: alert and oriented X3, cranial nerves II-XII grossly intact, strength and sensation to light touch equal in bilateral upper and lower extremities.   Lab Results: Reviewed and documented in Electronic Record Micro Results: Reviewed and documented in Electronic Record Studies/Results: Reviewed and documented in Electronic Record Medications: I have reviewed the patient's current medications. Scheduled Meds:   . bd getting started take home kit   1 kit Other Once  . Chlorhexidine Gluconate Cloth  6 each Topical Q0600  . enoxaparin (LOVENOX) injection  40 mg Subcutaneous Q24H  . hydrochlorothiazide  25 mg Oral Q breakfast  . insulin aspart  0-9 Units Subcutaneous TID AC & HS  . insulin aspart protamine-insulin aspart  10 Units Subcutaneous BID WC  . mupirocin ointment  1 application Nasal BID  . nicotine  14 mg Transdermal Daily  . vancomycin  1,000 mg Intravenous Q8H  . DISCONTD: Flexpen Starter Kit  1 kit Other Once   Continuous Infusions:   . sodium chloride 100 mL/hr at 09/24/12 0930   PRN Meds:.acetaminophen, acetaminophen, alum & mag hydroxide-simeth, HYDROmorphone (DILAUDID) injection, ondansetron (ZOFRAN) IV, ondansetron, oxyCODONE-acetaminophen, DISCONTD: oxyCODONE-acetaminophen, DISCONTD: oxyCODONE-acetaminophen, DISCONTD: promethazine Assessment/Plan: Mrs. Hinderman is a 56yo female who presented yesterday for R foot pain. Overall she is still in significant pain and unable to walk on her foot. She has an elevated WBC count had other purulent infections recently on her buttocks and labia. Her diabetes is not well controlled, and she has a previously undiagnosed 1/6 systolic heart murmur of unclear etiology.   Right foot cellulitis and Left Buttock abcess. Status post irrigation debridement excision of the foot on 10/30. A partial calcaneal excision was also performed. Abscess gram stain reports gram positive but cultures are still pending. In this patient with uncontrolled, type 2 diabetes, and history of recurrent skin infections with the most vaginal abscess that required I&D, there is a possibility that she has disseminated  infection that is seeding into the foot hematogenously.  Plan  - continue with wound care  - she has had PT -who have recommended HH PT and Rolling Walker with 5 wheels  - Left buttock wound did not yield pus on aspiration using a 18G needle. No surgical intervention was recommended by surgery.  - To  continue with antibiotics and wound care.  - On pain control with Dilaudid   Possible Infective Endocarditis: She was found to have a murmur in the right sternal border of 2/6 and holosystolic. There are no other stigmata of infective endocarditis. Transthoracic echocardiogram revealed a possible small vegetation, and that tip of mitral valve. She does not quite meet the criteria for a diagnosis of IE - only one 1 major(ECHO) and 1 minor [facility] (fever) criteria for IE. Her TEE only shows some thickening on the valve (official report pending) and has no positive blood cultures. Fever is the only other criteria. Therefore, no indication at this time for treatment for endocarditis. Fever likely secondary to abscess. Patient started spiking fevers 2 days ago with Temp 102.4 yesterday. It is possible that this fever is 2/2 vaccine reaction. WBC have actually improved from 31.5 on admission to 16.5 today. Per Dr Moshe Cipro recommendations, discontinued the Unasyn given gram positive organisms on gram stain of the pus. There are possibly streptococcal or staph. We can do vanc alone for now.  Plan  - Initial EKG-normal.  - This is day #6 of vancomycin - she will need at least 6 weeks of treatment  - D/c unasyn as recommended by Dr Ninetta Lights  - repeat Blood cx today. Initial blood cx no growth after 3 days. Will repeat blood cultures if she spikes a fever again.  -Tissue culture-pending  - Will continue to monitor WBC.  -    Lab  09/24/12 0445  09/23/12 0545  09/22/12 0700  09/21/12 1234   WBC  16.5*  20.3*  25.9*  31.9*    Uncontrolled Diabetes: The patient's home medications for diabetes include glimepiride and metformin. She also reports that she is been off medication for a long time due to cost. HbA1C is 15.6%.  Plan  -Insulin therapy with 14IU of 70/30 Novolog bid (increased from 10 units) in addition to meal coverage insulin. This will be a more affordable option compared to Lantus.  -[name] is assisting  setting her up with IM clinic  - She will be set up with internal medicine clinic for outpatient follow up.   Hypertension: Patient's home medications include hydrochlorothiazide. On admission her blood pressure was 110/86. She reports compliance with this medication.  Plan  -Will continue with home medications.   LOS: 4 days   Ky Barban 09/25/2012, 8:56 PM  Medical Student Daily Progress Note  Subjective: Mrs. Goodin was seen this afternoon and examined bedside. Her R foot is bandaged and continues to be moderatly painful on deep palpation or deep flexion of her toes. This is increased pain from yesterday.. She reports that the abscess on her L buttock still hurts some, but not as much as 2 days ago. She denies N/V/HA/ABD Pain at this time   Objective: Vital signs in last 24 hours: Filed Vitals:   09/24/12 1937 09/24/12 2255 09/25/12 0044 09/25/12 0531  BP:  119/92  128/70  Pulse:  111  97  Temp: 101.6 F (38.7 C) 102 F (38.9 C) 99.8 F (37.7 C) 98.7 F (37.1 C)  TempSrc: Oral Oral Oral Oral  Resp: 18 20  20  Height:      Weight:      SpO2:  100%  100%   Weight change:  No intake or output data in the 24 hours ending 09/25/12 0809 Physical Exam: BP 128/70  Pulse 97  Temp 98.7 F (37.1 C) (Oral)  Resp 20  Ht 5\' 3"  (1.6 m)  Wt 63.504 kg (140 lb)  BMI 24.80 kg/m2  SpO2 100%  General Appearance:    Alert, cooperative, no distress, appears stated age  Head:    Normocephalic, without obvious abnormality, atraumatic  Eyes:    PERRL, conjunctiva/corneas clear, EOM's intact, fundi    benign, both eyes  Ears:    Normal TM's and external ear canals, both ears  Nose:   Nares normal, septum midline, mucosa normal, no drainage    or sinus tenderness  Throat:   Lips, mucosa, and tongue normal; teeth and gums normal  Neck:   Supple, symmetrical, trachea midline, no adenopathy;    thyroid:  no enlargement/tenderness/nodules; no carotid   bruit or JVD  Back:      Symmetric, no curvature, ROM normal, no CVA tenderness  Lungs:     Clear to auscultation bilaterally, respirations unlabored  Chest Wall:    No tenderness or deformity   Heart:    Regular rate and rhythm, S1 and S2 normal, no murmur, rub   or gallop  Breast Exam:    No tenderness, masses, or nipple abnormality  Abdomen:     Soft, non-tender, bowel sounds active all four quadrants,    no masses, no organomegaly  Genitalia:    Normal female without lesion, discharge or tenderness  Rectal:    Normal tone, normal prostate, no masses or tenderness;   guaiac negative stool  Extremities:   Extremities normal, atraumatic, no cyanosis or edema  Pulses:   2+ and symmetric all extremities  Skin:   Skin color, texture, turgor normal, no rashes or lesions  Lymph nodes:   Cervical, supraclavicular, and axillary nodes normal  Neurologic:   CNII-XII intact, normal strength, sensation and reflexes    throughout   Lab Results: Basic Metabolic Panel:  Lab 09/25/12 0981 09/24/12 0445 09/23/12 1015  NA 136 135 --  K 3.3* 3.8 --  CL 99 101 --  CO2 30 24 --  GLUCOSE 318* 254* --  BUN 3* 4* --  CREATININE 0.55 0.51 --  CALCIUM 8.6 8.5 --  MG -- -- 1.8  PHOS -- -- --   Liver Function Tests:  Lab 09/22/12 0700  AST 7  ALT 5  ALKPHOS 75  BILITOT 0.2*  PROT 6.4  ALBUMIN 2.5*   No results found for this basename: LIPASE:2,AMYLASE:2 in the last 168 hours No results found for this basename: AMMONIA:2 in the last 168 hours CBC:  Lab 09/25/12 0648 09/24/12 0445 09/21/12 1234  WBC 16.6* 16.5* --  NEUTROABS -- -- 27.8*  HGB 10.8* 10.5* --  HCT 33.4* 31.9* --  MCV 78.6 78.2 --  PLT 390 370 --      CBG:  Lab 09/24/12 2253 09/24/12 1658 09/24/12 1239 09/24/12 0643 09/23/12 2245 09/23/12 1629  GLUCAP 189* 330* 151* 226* 232* 223*   Hemoglobin A1C:  Lab 09/21/12 1750  HGBA1C 15.6*  Urinalysis:  Lab 09/22/12 1407  COLORURINE YELLOW  LABSPEC 1.018  PHURINE 5.0  GLUCOSEU >1000*  HGBUR  NEGATIVE  BILIRUBINUR NEGATIVE  KETONESUR NEGATIVE  PROTEINUR NEGATIVE  UROBILINOGEN 0.2  NITRITE NEGATIVE  LEUKOCYTESUR SMALL*    Micro  Results:Studies/Results: No results found. Medications: I have reviewed the patient's current medications. Scheduled Meds:   . Chlorhexidine Gluconate Cloth  6 each Topical Q0600  . enoxaparin (LOVENOX) injection  40 mg Subcutaneous Q24H  . hydrochlorothiazide  25 mg Oral Q breakfast  . insulin aspart  0-9 Units Subcutaneous TID AC & HS  . insulin aspart protamine-insulin aspart  10 Units Subcutaneous BID WC  . mupirocin ointment  1 application Nasal BID  . nicotine  14 mg Transdermal Daily  . vancomycin  1,000 mg Intravenous Q8H  . DISCONTD: ampicillin-sulbactam (UNASYN) IV  3 g Intravenous Q6H  . DISCONTD: vancomycin  750 mg Intravenous Q12H   Cards Assessment:  She does not meet criteria for endocarditis. Her TEE only shows some thickening on the valve (official report pending) and has no positive blood cultures. Fever is the only other criteria. Therefore, no indication at this time for treatment for endocarditis. Fever likely secondary to abscess.    Assessment/Plan:   Leukocytosis, fever, Left arm vaccine site inflammation Cardiology feels she does not meet criteria for endocarditis, however her tempatures are concerning for dissemenated infection.  -vitals Q4hrs -consider telemetry if next BP is lower -CBC at 6pm to track leukocytosis -consider PICC line  Cellulitis of right foot The pain in her foot is likely related to her continue inflammatory state, bandaging is soft and non constricting.   Diabetes mellitus, type II Her A16 is >15, indicating chronic mismanagment of glucose -continue Novolog injection 10 units 2x daily, AM and afternoon with meals -consult -consider starting metformin tomorrow  On aspart (novolog) sliding and protamin/aspart (70/30)   Hypertension Her blood pressures have not been  Problematic  today -continue HCTZ tablet 25mg  PO 1xDaily AM   LOS: 4 days   This is a Psychologist, occupational Note.  The care of the patient was discussed with Dr. Garald Braver and the assessment and plan formulated with their assistance.  Please see their attached note for official documentation of the daily encounter.  Ellin Mayhew 09/25/2012, 8:09 AM

## 2012-09-25 NOTE — Progress Notes (Signed)
PT Cancellation Note  Patient Details Name: Linda Miller MRN: [id number] DOB: 1956-03-07   Cancelled Treatment:    Reason Eval/Treat Not Completed: Other (comment) (Patient refusing PT at this time.)  Patient reports she is frustrated and does not want to be bothered now.  Patient getting self from bed to recliner - she unlocked recliner so that it would roll.  Declined suggestions to lock recliner for safety. Will return tomorrow to attempt PT session.   Vena Austria 09/25/2012, 4:56 PM [phone]

## 2012-09-25 NOTE — Progress Notes (Signed)
Internal Medicine Teaching Service Attending Note Date: 09/25/2012  Patient name: Linda Miller  Medical record number: [id number]  Date of birth: 05-14-1956    This patient has been seen and discussed with the house staff. Please see their upcoming note for complete details.   The following plans were discussed on rounds -   Rt foot wound; Left buttock abscess - Improving per patient - Continue vancomycin for therapy - Wound care with daily dressing changes - Good pain control  ? Infective endocarditis - She currently does not meet criteria for endocarditis - Fever last evening, no cultures obtained - Repeat BC today, obtain cultures if another fever spike - Vancomycin  DM 2, uncontrolled - A1C 15.6 - Insulin 70/30 at 10 BID, will increase to 14 BID and maintain sliding scale - Consider adding metformin to regimen.     MULLEN, EMILY 09/25/2012, 8:06 PM

## 2012-09-25 NOTE — Progress Notes (Signed)
Patient ID: Linda Miller, female   DOB: Jan 13, 1956, 56 y.o.   MRN: [id number] Patient removed her dressing yesterday. She is to ambulate strict nonweightbearing on the right lower extremity. Will start dial soap cleansing today with daily dressing changes. Do to the extensive nature of the necrotic tissue in her heel were recommend discharged to home on by mouth antibiotics such as doxycycline.

## 2012-09-25 NOTE — Progress Notes (Signed)
    [facility]    Date of Admission:  09/21/2012   Total days of antibiotics 5        Day 5 vanco        2 Day of amp/sub d/c'd 10/30        1 day of clinda d/c'd 10/28   ID: Linda Miller is a 56 y.o. female  with poorly controlled diabetes HbA1C of 15who presented on 10/29 with 1 week of abscess on foot. There was no preceeding trauma or foreign body in her foot s/p I and D and noted necrotic tissue and required partial calcaneal excision. She was also noted to have a murmur and a TTE was obtained and then a TEE which showed some thickening of the mitral valve.   Principal Problem:  *Cellulitis of right foot Active Problems:  Diabetes mellitus, type II  Hypertension  Leukocytosis    Subjective: Febrile 102 yesterday. Afebrile this morning. Having some foot pain at heal at site of surgery. Had numerous questions regarding healing process and returning to work  Medications:     . Chlorhexidine Gluconate Cloth  6 each Topical Q0600  . enoxaparin (LOVENOX) injection  40 mg Subcutaneous Q24H  . Flexpen Starter Kit  1 kit Other Once  . hydrochlorothiazide  25 mg Oral Q breakfast  . insulin aspart  0-9 Units Subcutaneous TID AC & HS  . insulin aspart protamine-insulin aspart  10 Units Subcutaneous BID WC  . mupirocin ointment  1 application Nasal BID  . nicotine  14 mg Transdermal Daily  . vancomycin  1,000 mg Intravenous Q8H  . DISCONTD: vancomycin  750 mg Intravenous Q12H    Objective: Vital signs in last 24 hours: Temp:  [98.7 F (37.1 C)-102 F (38.9 C)] 98.7 F (37.1 C) (11/01 0531) Pulse Rate:  [97-111] 97  (11/01 0531) Resp:  [18-20] 20  (11/01 0531) BP: (119-133)/(70-92) 128/70 mmHg (11/01 0531) SpO2:  [100 %] 100 % (11/01 0531) General: Awake, alert, nad  Skin: foot with incision, stitches, clean wound bed. Mild tenderness at heal, no erythema, no fluctuance Lungs: CTA B  Cor: RRR SEM  Abdomen: soft, ntnd, +bs     Lab  Results  [facility] 09/25/12 0648 09/24/12 0445  WBC 16.6* 16.5*  HGB 10.8* 10.5*  HCT 33.4* 31.9*  NA 136 135  K 3.3* 3.8  CL 99 101  CO2 30 24  BUN 3* 4*  CREATININE 0.55 0.51  GLU -- --    Microbiology: 11/1 blood cx x 2 NGTD 10/31 blood cx x 2 NGTD 10/30 tissue cx : staph aureus (sensi pending)  Studies/Results: No results found.   Assessment/Plan: Staph aureus deep tissue infection s/p debridement = currently on vancomycin. Await for susceptibilities. Will likely recommend 3-4 wks treatment of deep tissue infection. Dr. Lajoyce Corners managing further debridement and wound care recs  Endocarditis work - up = await finalization of blood cultures, thus far, not meeting criteria for endocarditis.  Dm management= per primary team. Improved diabetes management will also help with wound healing  [name], [facility] Cell: [phone] Pager: [phone]  09/25/2012, 1:10 PM

## 2012-09-26 LAB — CBC
HCT: 31.4 % — ABNORMAL LOW (ref 36.0–46.0)
MCHC: 32.5 g/dL (ref 30.0–36.0)
Platelets: 422 10*3/uL — ABNORMAL HIGH (ref 150–400)
RDW: 13.6 % (ref 11.5–15.5)
WBC: 15.2 10*3/uL — ABNORMAL HIGH (ref 4.0–10.5)

## 2012-09-26 LAB — BASIC METABOLIC PANEL
BUN: 4 mg/dL — ABNORMAL LOW (ref 6–23)
Chloride: 102 mEq/L (ref 96–112)
GFR calc Af Amer: >90 mL/min (ref >90)
GFR calc non Af Amer: >90 mL/min (ref >90)
Potassium: 3.6 mEq/L (ref 3.5–5.1)
Sodium: 141 mEq/L (ref 135–145)

## 2012-09-26 LAB — GLUCOSE, CAPILLARY
Glucose-Capillary: 314 mg/dL — ABNORMAL HIGH (ref 70–99)
Glucose-Capillary: 317 mg/dL — ABNORMAL HIGH (ref 70–99)

## 2012-09-26 LAB — CULTURE, ROUTINE-ABSCESS: Gram Stain: NONE SEEN

## 2012-09-26 MED ORDER — DIPHENHYDRAMINE HCL 25 MG PO CAPS
25.0000 mg | ORAL_CAPSULE | Freq: Four times a day (QID) | ORAL | Status: DC | PRN
  Administered 2012-09-26 – 2012-09-30 (×4): 25 mg via ORAL
  Filled 2012-09-26 (×4): qty 1

## 2012-09-26 MED ORDER — BD GETTING STARTED TAKE HOME KIT: 3/10ML X 30G SYRINGES
1.0000 | Freq: Once | Status: AC
  Administered 2012-09-26: 1
  Filled 2012-09-26: qty 1

## 2012-09-26 NOTE — Progress Notes (Signed)
Subjective: Her right foot is exquisitely tender today. She reports that her left arm is still tender but is getting better. The abscess on her labia are also improving.   She denies chest pain, shortness of breath, or abdominal pain.  Objective: Vital signs in last 24 hours: Filed Vitals:   09/25/12 2105 09/26/12 0137 09/26/12 0600 09/26/12 1115  BP: 123/70 110/62 125/72 132/82  Pulse: 97 98 85 79  Temp: 98.6 F (37 C) 98.7 F (37.1 C) 98.5 F (36.9 C) 98.2 F (36.8 C)  TempSrc: Oral Oral Oral Oral  Resp: 20 20 20 18   Height:      Weight:      SpO2: 100% 100% 98% 98%   Weight change:   Intake/Output Summary (Last 24 hours) at 09/26/12 1457 Last data filed at 09/26/12 0600  Gross per 24 hour  Intake   2280 ml  Output      0 ml  Net   2280 ml   Vitals reviewed. General: sitting in bed ready to have her right foot bandage changes, in mild distress 2/2 to foot pain.  HEENT:  no scleral icterus Cardiac: RRR, no rubs,  or gallops.  Pulm: clear to auscultation bilaterally, no wheezes, rales, or rhonchi Abd: soft, nontender, nondistended, BS present Ext: Left arm with erythema, tenderness, and erythema surrounding the site of her Tdap vaccine, much improved today.  Right foot wound with no purulent discharge, sutures in place, no active bleeding. Right foot is exquisitely tender to gentle palpation. ROM limited 2/2 pain. Foot cool to touch. Pedal pulse present per hand-held vascular doppler.  Neuro: alert and oriented X3, cranial nerves II-XII grossly intact,  sensation to light touch equal in bilateral upper and lower extremities.   Lab Results: Basic Metabolic Panel:  Lab 09/26/12 7829 09/25/12 0648 09/23/12 1015  NA 141 136 --  K 3.6 3.3* --  CL 102 99 --  CO2 29 30 --  GLUCOSE 250* 318* --  BUN 4* 3* --  CREATININE 0.49* 0.55 --  CALCIUM 8.8 8.6 --  MG -- -- 1.8  PHOS -- -- --   Liver Function Tests:  Lab 09/22/12 0700  AST 7  ALT 5  ALKPHOS 75  BILITOT 0.2*    PROT 6.4  ALBUMIN 2.5*   CBC:  Lab 09/26/12 0540 09/25/12 0648 09/21/12 1234  WBC 15.2* 16.6* --  NEUTROABS -- -- 27.8*  HGB 10.2* 10.8* --  HCT 31.4* 33.4* --  MCV 77.5* 78.6 --  PLT 422* 390 --   CBG:  Lab 09/26/12 1136 09/26/12 0739 09/25/12 2059 09/25/12 1642 09/25/12 1305 09/25/12 0703  GLUCAP 144* 314* 204* 255* 241* 302*   Hemoglobin A1C:  Lab 09/21/12 1750  HGBA1C 15.6*   Urinalysis:  Lab 09/22/12 1407  COLORURINE YELLOW  LABSPEC 1.018  PHURINE 5.0  GLUCOSEU >1000*  HGBUR NEGATIVE  BILIRUBINUR NEGATIVE  KETONESUR NEGATIVE  PROTEINUR NEGATIVE  UROBILINOGEN 0.2  NITRITE NEGATIVE  LEUKOCYTESUR SMALL*   Micro Results: Recent Results (from the past 240 hour(s))  CULTURE, BLOOD (ROUTINE X 2)     Status: Normal (Preliminary result)   Collection Time   09/22/12  7:00 AM      Component Value Range Status Comment   Specimen Description BLOOD LEFT ARM   Final    Special Requests BOTTLES DRAWN AEROBIC AND ANAEROBIC 10CC   Final    Culture  Setup Time 09/22/2012 13:44   Final    Culture     Final  Value:        BLOOD CULTURE RECEIVED NO GROWTH TO DATE CULTURE WILL BE HELD FOR 5 DAYS BEFORE ISSUING A FINAL NEGATIVE REPORT   Report Status PENDING   Incomplete   CULTURE, BLOOD (ROUTINE X 2)     Status: Normal (Preliminary result)   Collection Time   09/22/12  7:05 AM      Component Value Range Status Comment   Specimen Description BLOOD LEFT ARM   Final    Special Requests BOTTLES DRAWN AEROBIC AND ANAEROBIC 10CC   Final    Culture  Setup Time 09/22/2012 13:44   Final    Culture     Final    Value:        BLOOD CULTURE RECEIVED NO GROWTH TO DATE CULTURE WILL BE HELD FOR 5 DAYS BEFORE ISSUING A FINAL NEGATIVE REPORT   Report Status PENDING   Incomplete   URINE CULTURE     Status: Normal   Collection Time   09/22/12  2:07 PM      Component Value Range Status Comment   Specimen Description URINE, CLEAN CATCH   Final    Special Requests NONE   Final     Culture  Setup Time 09/22/2012 16:04   Final    Colony Count >=100,000 COLONIES/ML   Final    Culture ESCHERICHIA COLI   Final    Report Status 09/24/2012 FINAL   Final    Organism ID, Bacteria ESCHERICHIA COLI   Final   MRSA PCR SCREENING     Status: Normal   Collection Time   09/22/12  5:20 PM      Component Value Range Status Comment   MRSA by PCR NEGATIVE  NEGATIVE Final   SURGICAL PCR SCREEN     Status: Abnormal   Collection Time   09/23/12  2:00 AM      Component Value Range Status Comment   MRSA, PCR POSITIVE (*) NEGATIVE Final    Staphylococcus aureus POSITIVE (*) NEGATIVE Final   CULTURE, ROUTINE-ABSCESS     Status: Normal   Collection Time   09/23/12  8:45 AM      Component Value Range Status Comment   Specimen Description ABSCESS RIGHT FOOT   Final    Special Requests RIGHT HEEL PT ON ZOSYN AND VANCO   Final    Gram Stain     Final    Value: NO WBC SEEN     RARE SQUAMOUS EPITHELIAL CELLS PRESENT     RARE GRAM POSITIVE COCCI IN PAIRS   Culture     Final    Value: MODERATE METHICILLIN RESISTANT STAPHYLOCOCCUS AUREUS     Note: RIFAMPIN AND GENTAMICIN SHOULD NOT BE USED AS SINGLE DRUGS FOR TREATMENT OF STAPH INFECTIONS.     Note: CRITICAL RESULT CALLED TO, READ BACK BY AND VERIFIED WITH: ASHLEY MINOR @ 10:05AM  09/26/12 BY DWEEKS   Report Status 09/26/2012 FINAL   Final    Organism ID, Bacteria METHICILLIN RESISTANT STAPHYLOCOCCUS AUREUS   Final   ANAEROBIC CULTURE     Status: Normal (Preliminary result)   Collection Time   09/23/12  8:45 AM      Component Value Range Status Comment   Specimen Description ABSCESS RIGHT FOOT   Final    Special Requests RIGHT HEEL PT ON ZOSYN AND VANCO   Final    Gram Stain PENDING   Incomplete    Culture     Final    Value: NO ANAEROBES ISOLATED;  CULTURE IN PROGRESS FOR 5 DAYS   Report Status PENDING   Incomplete   CULTURE, BLOOD (ROUTINE X 2)     Status: Normal (Preliminary result)   Collection Time   09/24/12 12:40 PM       Component Value Range Status Comment   Specimen Description BLOOD LEFT ARM   Final    Special Requests BOTTLES DRAWN AEROBIC AND ANAEROBIC 10CC   Final    Culture  Setup Time 09/24/2012 16:59   Final    Culture     Final    Value:        BLOOD CULTURE RECEIVED NO GROWTH TO DATE CULTURE WILL BE HELD FOR 5 DAYS BEFORE ISSUING A FINAL NEGATIVE REPORT   Report Status PENDING   Incomplete   CULTURE, BLOOD (ROUTINE X 2)     Status: Normal (Preliminary result)   Collection Time   09/24/12 12:50 PM      Component Value Range Status Comment   Specimen Description BLOOD LEFT HAND   Final    Special Requests BOTTLES DRAWN AEROBIC ONLY 5CC   Final    Culture  Setup Time 09/24/2012 16:59   Final    Culture     Final    Value:        BLOOD CULTURE RECEIVED NO GROWTH TO DATE CULTURE WILL BE HELD FOR 5 DAYS BEFORE ISSUING A FINAL NEGATIVE REPORT   Report Status PENDING   Incomplete   CULTURE, BLOOD (ROUTINE X 2)     Status: Normal (Preliminary result)   Collection Time   09/25/12 12:35 PM      Component Value Range Status Comment   Specimen Description BLOOD RIGHT ARM   Final    Special Requests BOTTLES DRAWN AEROBIC AND ANAEROBIC 10CC   Final    Culture  Setup Time 09/25/2012 17:08   Final    Culture     Final    Value:        BLOOD CULTURE RECEIVED NO GROWTH TO DATE CULTURE WILL BE HELD FOR 5 DAYS BEFORE ISSUING A FINAL NEGATIVE REPORT   Report Status PENDING   Incomplete   CULTURE, BLOOD (ROUTINE X 2)     Status: Normal (Preliminary result)   Collection Time   09/25/12 12:42 PM      Component Value Range Status Comment   Specimen Description BLOOD RIGHT ARM   Final    Special Requests BOTTLES DRAWN AEROBIC AND ANAEROBIC 10CC   Final    Culture  Setup Time 09/25/2012 17:08   Final    Culture     Final    Value:        BLOOD CULTURE RECEIVED NO GROWTH TO DATE CULTURE WILL BE HELD FOR 5 DAYS BEFORE ISSUING A FINAL NEGATIVE REPORT   Report Status PENDING   Incomplete    Medications: I have  reviewed the patient's current medications. Scheduled Meds:   . bd getting started take home kit  1 kit Other Once  . Chlorhexidine Gluconate Cloth  6 each Topical Q0600  . enoxaparin (LOVENOX) injection  40 mg Subcutaneous Q24H  . hydrochlorothiazide  25 mg Oral Q breakfast  . insulin aspart  0-9 Units Subcutaneous TID AC & HS  . insulin aspart protamine-insulin aspart  14 Units Subcutaneous BID WC  . mupirocin ointment  1 application Nasal BID  . nicotine  14 mg Transdermal Daily  . vancomycin  1,000 mg Intravenous Q8H  . DISCONTD: bd getting started take home  kit  1 kit Other Once  . DISCONTD: Flexpen Starter Kit  1 kit Other Once  . DISCONTD: insulin aspart protamine-insulin aspart  10 Units Subcutaneous BID WC   Continuous Infusions:   . sodium chloride 100 mL/hr at 09/26/12 0625   PRN Meds:.acetaminophen, acetaminophen, alum & mag hydroxide-simeth, diphenhydrAMINE, HYDROmorphone (DILAUDID) injection, ondansetron (ZOFRAN) IV, ondansetron, oxyCODONE-acetaminophen Assessment/Plan: Mrs. Krienke is a 56yo female who presented yesterday for R foot pain. Overall she is still in significant pain and unable to walk on her foot. She has an elevated WBC count had other purulent infections recently on her buttocks and labia. Her diabetes is not well controlled, and she has a previously undiagnosed 1/6 systolic heart murmur of unclear etiology.   Right foot cellulitis and Left Buttock abcess. Status post irrigation debridement excision of the foot on 10/30. A partial calcaneal excision was also performed. Abscess gram stain reports gram positive but cultures that grew MRSA. In this patient with uncontrolled, type 2 diabetes, and history of recurrent skin infections with the most vaginal abscess that required I&D, there is a possibility that she has disseminated infection that is seeding into the foot hematogenously.  Plan  - ID following, appreciate recommendations. Pt will eventually need PICC  placement for long term IV Abx.  - Orthopedics following. Appreciate recommendations on weight bearing, wound care, and follow up plan.  - Continue with wound care recommendations per ortho.  - she has had PT -who have recommended HH PT and Rolling Walker with 5 wheels  - Left buttock wound did not yield pus on aspiration using a 18G needle. No surgical intervention was recommended by surgery.  - Continue Vanc.  - On pain control with Dilaudid   Possible Infective Endocarditis: She was found to have a murmur in the right sternal border of 2/6 and holosystolic. There are no other stigmata of infective endocarditis. Transthoracic echocardiogram revealed a possible small vegetation, and that tip of mitral valve. She did not quite meet the criteria for a diagnosis of IE - only one 1 major(ECHO) and 1 minor Duke (fever) criteria for IE. Her TEE only shows some thickening on the valve and has no positive blood cultures. Fever is the only other criteria. Therefore, no indication at this time for treatment for endocarditis. Fever likely secondary to abscess. Patient started spiked fevers 3 days ago with Tmax of 102.66F. She has been afebrile for 24hours now. It is possible that this fever was 2/2 to her Tdap vaccination. WBC have actually improved from 31.5 on admission to 15.5 today. Per ID recommendations, discontinued the Unasyn given gram positive organisms on gram stain of the Right food wound drainage.  Plan  - This is day #6 of vancomycin - she will need at least 6 weeks of treatment - ID following, appreciate recommendations.  -  Initial blood cx with NGTD. Will repeat blood cultures if she spikes a fever again.  - Tissue culture with MRSA - Will continue to monitor WBC.   Uncontrolled Diabetes: The patient's home medications for diabetes include glimepiride and metformin. She also reports that she is been off medication for a long time due to cost. HbA1C is 15.6%.  Plan  -Insulin therapy with 14IU  of 70/30 Novolog bid (increased from 10 units) in addition to meal coverage insulin. This will be a more affordable option compared to Lantus.  -[name] is assisting setting her up with IM clinic  - She will be set up with internal medicine clinic for outpatient  follow up.   Hypertension: Patient's home medications include hydrochlorothiazide. On admission her blood pressure was 110/86. She reports compliance with this medication.  Plan  -Will continue with home medications.   LOS: 5 days   Ky Barban 09/26/2012, 2:57 PM

## 2012-09-26 NOTE — Progress Notes (Addendum)
[facility]    Date of Admission:  09/21/2012   Total days of antibiotics 6        Day 6 vanco        2 Day of amp/sub d/c'd 10/30        1 day of clinda d/c'd 10/28   ID: Linda Miller is a 56 y.o. female  with poorly controlled diabetes HbA1C of 15who presented on 10/29 with MRSA foot/heel abscess s/p I and D with partial calcaneal excision. She was also noted to have a murmur and a TTE was obtained and then a TEE which showed some thickening of the mitral valve vs. Small mass ? Possibly endocarditis  Principal Problem:  *Cellulitis of right foot Active Problems:  Diabetes mellitus, type II  Hypertension  Leukocytosis    Subjective: Afebrile for 36 hrs. Having some foot pain at heal at site of surgery.   Medications:     . bd getting started take home kit  1 kit Other Once  . Chlorhexidine Gluconate Cloth  6 each Topical Q0600  . enoxaparin (LOVENOX) injection  40 mg Subcutaneous Q24H  . hydrochlorothiazide  25 mg Oral Q breakfast  . insulin aspart  0-9 Units Subcutaneous TID AC & HS  . insulin aspart protamine-insulin aspart  14 Units Subcutaneous BID WC  . mupirocin ointment  1 application Nasal BID  . nicotine  14 mg Transdermal Daily  . vancomycin  1,000 mg Intravenous Q8H  . DISCONTD: bd getting started take home kit  1 kit Other Once  . DISCONTD: Flexpen Starter Kit  1 kit Other Once  . DISCONTD: insulin aspart protamine-insulin aspart  10 Units Subcutaneous BID WC    Objective: Vital signs in last 24 hours: Temp:  [98.1 F (36.7 C)-98.7 F (37.1 C)] 98.2 F (36.8 C) (11/02 1115) Pulse Rate:  [79-98] 79  (11/02 1115) Resp:  [18-20] 18  (11/02 1115) BP: (110-140)/(62-82) 132/82 mmHg (11/02 1115) SpO2:  [98 %-100 %] 98 % (11/02 1115) General: Awake, alert, nad  Skin: foot with incision, stitches, clean wound bed. Mild tenderness at heal, no erythema, no fluctuance Lungs: CTA B  Cor: RRR SEM  Abdomen: soft, ntnd, +bs      Lab Results  [facility] 09/26/12 0540 09/25/12 0648  WBC 15.2* 16.6*  HGB 10.2* 10.8*  HCT 31.4* 33.4*  NA 141 136  K 3.6 3.3*  CL 102 99  CO2 29 30  BUN 4* 3*  CREATININE 0.49* 0.55  GLU -- --   TEE: There was a small mass versus thickening at the tip of the anterior mitral leaflet with mild to moderate MR. Normal LV systolic function, EF 60%. No vegetation on the AoV, TV, or PV.   Microbiology: 11/1 blood cx x 2 NGTD 10/31 blood cx x 2 NGTD 10/30 tissue cx : MRSA ( S clinda S tetracycline S bactrim S vanco I levo)  Studies/Results: No results found.   Assessment/Plan: MRSA deep tissue infection s/p debridement = continue with vancomycin. She will need picc line and recommend treat for 4 wks treatment of deep tissue infection. Dr. Lajoyce Corners managing further debridement and wound care recs  - will have her follow up in ID clinic in 3-4 wk to decide if she needs further abtx therapy  - she will need weekly vanco trough, CBC, CMP  While she is on vancomycin.  - she may need snf placement that can provide her vancomycin and wound care.  Endocarditis  work - up = await finalization of blood cultures, thus far, not meeting criteria for endocarditis, although there is thickening vs. Mass on mitral valve. She is getting treated with 4 wks of IV antibiotics for MRSA deep tissue infection of heel to explain fevers and leukocytosis. We have no cardiac history to how long she may have had murmurs.  Dm management= per primary team. Improved diabetes management will also help with wound healing  Alta Goding, [facility] Cell: [phone] Pager: [phone]  09/26/2012, 2:22 PM

## 2012-09-26 NOTE — Progress Notes (Signed)
Internal Medicine Teaching Service Attending Note Date: 09/26/2012  Patient name: Linda Miller  Medical record number: [id number]  Date of birth: 10/01/1956    This patient has been seen and discussed with the house staff. Please see their note for complete details. I concur with their findings discussed in Dr. Jorje Guild note.  Ashleigh Arya 09/26/2012, 8:31 PM

## 2012-09-26 NOTE — Progress Notes (Signed)
ANTIBIOTIC CONSULT NOTE - FOLLOW UP  Pharmacy Consult for Vancomcyin Indication: MRSA foot/heel abscess   Allergies  Allergen Reactions  . Codeine Other (See Comments)    Throat Swelling and itching  . Tramadol Hives and Itching    Patient Measurements: Height: 5\' 3"  (160 cm) Weight: 144 lb 6.4 oz (65.5 kg) IBW/kg (Calculated) : 52.4    Vital Signs: Temp: 98.7 F (37.1 C) (11/02 1831) Temp src: Oral (11/02 1831) BP: 124/84 mmHg (11/02 1831) Pulse Rate: 94  (11/02 1831) Intake/Output from previous day: 11/01 0701 - 11/02 0700 In: 2280 [P.O.:480; I.V.:900; IV Piggyback:900] Out: -  Intake/Output from this shift:    Labs:  Basename 09/26/12 0540 09/25/12 0648 09/24/12 0445  WBC 15.2* 16.6* 16.5*  HGB 10.2* 10.8* 10.5*  PLT 422* 390 370  LABCREA -- -- --  CREATININE 0.49* 0.55 0.51   Estimated Creatinine Clearance: 73.1 ml/min (by C-G formula based on Cr of 0.49).   Assessment: 56 yo female on Day #6 Vancomycin for MRSA foot/heel abscess. Noted plans for PICC line and 4 weeks of IV vancomycin per ID note. Blood cx remain ngtd. Vancomycin trough therapeutic (15.2 mcg/ml) on 1gm IV q8h.   Goal of Therapy:  Vancomycin trough 15 -20 mcg/ml  Plan:  -Continue Vancomycin 1gm IV q8h -Will follow renal function and weekly vancomycin trough  Christoper Fabian, PharmD, BCPS Clinical pharmacist, pager [phone]  09/26/2012 11:30 PM

## 2012-09-26 NOTE — Progress Notes (Signed)
Patient drew up and administered her own insulin for her noon coverage. Patient needs further teaching in reading the insulin syringe.  Minor, Yvette Rack

## 2012-09-26 NOTE — Progress Notes (Signed)
Patient drew up and administered 7 u of insulin to herself for dinner time. Continue teaching.  Minor, Linda Miller

## 2012-09-26 NOTE — Progress Notes (Signed)
Diane from [facility] called RN, said that "the abscess culture was positive for MRSA." This was relayed to the MD on call. No new orders  Minor, Morrie Sheldon Genae Strine

## 2012-09-26 NOTE — Progress Notes (Signed)
PT Cancellation Note  Patient Details Name: Linda Miller MRN: [id number] DOB: Nov 28, 1955   Cancelled Treatment:    Reason Eval/Treat Not Completed: Medical issues which prohibited therapy.  Patient c/o pain in foot, and foot bleeding (bumped foot on way to bathroom per patient).  Max encouragement to participate with PT and try kneeling walker.  Patient declined.  Will return tomorrow.   Vena Austria 09/26/2012, 5:24 PM [phone]

## 2012-09-27 LAB — CBC
HCT: 34.5 % — ABNORMAL LOW (ref 36.0–46.0)
Hemoglobin: 11.1 g/dL — ABNORMAL LOW (ref 12.0–15.0)
MCH: 25.2 pg — ABNORMAL LOW (ref 26.0–34.0)
MCHC: 32.2 g/dL (ref 30.0–36.0)
MCV: 78.2 fL (ref 78.0–100.0)
RDW: 13.4 % (ref 11.5–15.5)

## 2012-09-27 LAB — GLUCOSE, CAPILLARY
Glucose-Capillary: 275 mg/dL — ABNORMAL HIGH (ref 70–99)
Glucose-Capillary: 180 mg/dL — ABNORMAL HIGH (ref 70–99)
Glucose-Capillary: 343 mg/dL — ABNORMAL HIGH (ref 70–99)

## 2012-09-27 LAB — BASIC METABOLIC PANEL
BUN: 4 mg/dL — ABNORMAL LOW (ref 6–23)
Creatinine, Ser: 0.53 mg/dL (ref 0.50–1.10)
GFR calc Af Amer: >90 mL/min (ref >90)
GFR calc non Af Amer: >90 mL/min (ref >90)
Glucose, Bld: 259 mg/dL — ABNORMAL HIGH (ref 70–99)
Potassium: 3.7 mEq/L (ref 3.5–5.1)

## 2012-09-27 LAB — C-REACTIVE PROTEIN: CRP: 6.6 mg/dL — ABNORMAL HIGH (ref <0.60)

## 2012-09-27 LAB — SEDIMENTATION RATE: Sed Rate: 75 mm/hr — ABNORMAL HIGH (ref 0–22)

## 2012-09-27 NOTE — Progress Notes (Signed)
PT Cancellation Note  Patient Details Name: Linda Miller MRN: [id number] DOB: February 19, 1956   Cancelled Treatment:    Reason Eval/Treat Not Completed: Other (comment) (pt declining OOB)  Had received pain meds earlier Will retry tomorrow   Van Clines [facility] 09/27/2012, 12:44 PM

## 2012-09-27 NOTE — Progress Notes (Signed)
Subjective:  Reports having severe pain in right foot. No fever or chills. She denies chest pain, shortness of breath, or abdominal pain.  Objective: Vital signs in last 24 hours: Filed Vitals:   09/26/12 2055 09/27/12 0547 09/27/12 1414 09/27/12 2140  BP: 134/71 118/78 108/71 115/69  Pulse: 60 82 85 85  Temp: 98 F (36.7 C) 97.7 F (36.5 C) 98.3 F (36.8 C) 98.2 F (36.8 C)  TempSrc: Oral Oral Oral Oral  Resp: 18 18 18 17   Height:      Weight:      SpO2: 95% 98% 100% 98%   Weight change:   Intake/Output Summary (Last 24 hours) at 09/27/12 2213 Last data filed at 09/27/12 1800  Gross per 24 hour  Intake   2040 ml  Output      0 ml  Net   2040 ml   Vitals reviewed. General: sitting in bed ready to have her right foot bandage changes, in mild distress 2/2 to foot pain.  HEENT:  no scleral icterus Cardiac: RRR, no rubs,  or gallops.  Pulm: clear to auscultation bilaterally, no wheezes, rales, or rhonchi Abd: soft, nontender, nondistended, BS present Ext: Left arm with erythema, tenderness, and erythema surrounding the site of her Tdap vaccine, much improved today.  Right foot wound with no purulent discharge, sutures in place, no active bleeding. Right foot is exquisitely tender to gentle palpation. ROM limited 2/2 pain. Foot is warm with good pedal pulse. Neuro: alert and oriented X3, cranial nerves II-XII grossly intact,  sensation to light touch equal in bilateral upper and lower extremities.   Lab Results: Basic Metabolic Panel:  Lab 09/27/12 1610 09/26/12 0540 09/23/12 1015  NA 137 141 --  K 3.7 3.6 --  CL 100 102 --  CO2 31 29 --  GLUCOSE 259* 250* --  BUN 4* 4* --  CREATININE 0.53 0.49* --  CALCIUM 9.1 8.8 --  MG -- -- 1.8  PHOS -- -- --   Liver Function Tests:  Lab 09/22/12 0700  AST 7  ALT 5  ALKPHOS 75  BILITOT 0.2*  PROT 6.4  ALBUMIN 2.5*   CBC:  Lab 09/27/12 0552 09/26/12 0540 09/21/12 1234  WBC 10.2 15.2* --  NEUTROABS -- -- 27.8*  HGB  11.1* 10.2* --  HCT 34.5* 31.4* --  MCV 78.2 77.5* --  PLT 482* 422* --   CBG:  Lab 09/27/12 1718 09/27/12 1214 09/27/12 0747 09/26/12 2140 09/26/12 1645 09/26/12 1136  GLUCAP 343* 180* 275* 275* 317* 144*   Hemoglobin A1C:  Lab 09/21/12 1750  HGBA1C 15.6*   Urinalysis:  Lab 09/22/12 1407  COLORURINE YELLOW  LABSPEC 1.018  PHURINE 5.0  GLUCOSEU >1000*  HGBUR NEGATIVE  BILIRUBINUR NEGATIVE  KETONESUR NEGATIVE  PROTEINUR NEGATIVE  UROBILINOGEN 0.2  NITRITE NEGATIVE  LEUKOCYTESUR SMALL*   Micro Results: Recent Results (from the past 240 hour(s))  CULTURE, BLOOD (ROUTINE X 2)     Status: Normal (Preliminary result)   Collection Time   09/22/12  7:00 AM      Component Value Range Status Comment   Specimen Description BLOOD LEFT ARM   Final    Special Requests BOTTLES DRAWN AEROBIC AND ANAEROBIC 10CC   Final    Culture  Setup Time 09/22/2012 13:44   Final    Culture     Final    Value:        BLOOD CULTURE RECEIVED NO GROWTH TO DATE CULTURE WILL BE HELD FOR 5 DAYS  BEFORE ISSUING A FINAL NEGATIVE REPORT   Report Status PENDING   Incomplete   CULTURE, BLOOD (ROUTINE X 2)     Status: Normal (Preliminary result)   Collection Time   09/22/12  7:05 AM      Component Value Range Status Comment   Specimen Description BLOOD LEFT ARM   Final    Special Requests BOTTLES DRAWN AEROBIC AND ANAEROBIC 10CC   Final    Culture  Setup Time 09/22/2012 13:44   Final    Culture     Final    Value:        BLOOD CULTURE RECEIVED NO GROWTH TO DATE CULTURE WILL BE HELD FOR 5 DAYS BEFORE ISSUING A FINAL NEGATIVE REPORT   Report Status PENDING   Incomplete   URINE CULTURE     Status: Normal   Collection Time   09/22/12  2:07 PM      Component Value Range Status Comment   Specimen Description URINE, CLEAN CATCH   Final    Special Requests NONE   Final    Culture  Setup Time 09/22/2012 16:04   Final    Colony Count >=100,000 COLONIES/ML   Final    Culture ESCHERICHIA COLI   Final    Report  Status 09/24/2012 FINAL   Final    Organism ID, Bacteria ESCHERICHIA COLI   Final   MRSA PCR SCREENING     Status: Normal   Collection Time   09/22/12  5:20 PM      Component Value Range Status Comment   MRSA by PCR NEGATIVE  NEGATIVE Final   SURGICAL PCR SCREEN     Status: Abnormal   Collection Time   09/23/12  2:00 AM      Component Value Range Status Comment   MRSA, PCR POSITIVE (*) NEGATIVE Final    Staphylococcus aureus POSITIVE (*) NEGATIVE Final   CULTURE, ROUTINE-ABSCESS     Status: Normal   Collection Time   09/23/12  8:45 AM      Component Value Range Status Comment   Specimen Description ABSCESS RIGHT FOOT   Final    Special Requests RIGHT HEEL PT ON ZOSYN AND VANCO   Final    Gram Stain     Final    Value: NO WBC SEEN     RARE SQUAMOUS EPITHELIAL CELLS PRESENT     RARE GRAM POSITIVE COCCI IN PAIRS   Culture     Final    Value: MODERATE METHICILLIN RESISTANT STAPHYLOCOCCUS AUREUS     Note: RIFAMPIN AND GENTAMICIN SHOULD NOT BE USED AS SINGLE DRUGS FOR TREATMENT OF STAPH INFECTIONS.     Note: CRITICAL RESULT CALLED TO, READ BACK BY AND VERIFIED WITH: ASHLEY MINOR @ 10:05AM  09/26/12 BY DWEEKS   Report Status 09/26/2012 FINAL   Final    Organism ID, Bacteria METHICILLIN RESISTANT STAPHYLOCOCCUS AUREUS   Final   ANAEROBIC CULTURE     Status: Normal (Preliminary result)   Collection Time   09/23/12  8:45 AM      Component Value Range Status Comment   Specimen Description ABSCESS RIGHT FOOT   Final    Special Requests RIGHT HEEL PT ON ZOSYN AND VANCO   Final    Gram Stain PENDING   Incomplete    Culture     Final    Value: NO ANAEROBES ISOLATED; CULTURE IN PROGRESS FOR 5 DAYS   Report Status PENDING   Incomplete   CULTURE, BLOOD (ROUTINE X 2)  Status: Normal (Preliminary result)   Collection Time   09/24/12 12:40 PM      Component Value Range Status Comment   Specimen Description BLOOD LEFT ARM   Final    Special Requests BOTTLES DRAWN AEROBIC AND ANAEROBIC 10CC    Final    Culture  Setup Time 09/24/2012 16:59   Final    Culture     Final    Value:        BLOOD CULTURE RECEIVED NO GROWTH TO DATE CULTURE WILL BE HELD FOR 5 DAYS BEFORE ISSUING A FINAL NEGATIVE REPORT   Report Status PENDING   Incomplete   CULTURE, BLOOD (ROUTINE X 2)     Status: Normal (Preliminary result)   Collection Time   09/24/12 12:50 PM      Component Value Range Status Comment   Specimen Description BLOOD LEFT HAND   Final    Special Requests BOTTLES DRAWN AEROBIC ONLY 5CC   Final    Culture  Setup Time 09/24/2012 16:59   Final    Culture     Final    Value:        BLOOD CULTURE RECEIVED NO GROWTH TO DATE CULTURE WILL BE HELD FOR 5 DAYS BEFORE ISSUING A FINAL NEGATIVE REPORT   Report Status PENDING   Incomplete   CULTURE, BLOOD (ROUTINE X 2)     Status: Normal (Preliminary result)   Collection Time   09/25/12 12:35 PM      Component Value Range Status Comment   Specimen Description BLOOD RIGHT ARM   Final    Special Requests BOTTLES DRAWN AEROBIC AND ANAEROBIC 10CC   Final    Culture  Setup Time 09/25/2012 17:08   Final    Culture     Final    Value:        BLOOD CULTURE RECEIVED NO GROWTH TO DATE CULTURE WILL BE HELD FOR 5 DAYS BEFORE ISSUING A FINAL NEGATIVE REPORT   Report Status PENDING   Incomplete   CULTURE, BLOOD (ROUTINE X 2)     Status: Normal (Preliminary result)   Collection Time   09/25/12 12:42 PM      Component Value Range Status Comment   Specimen Description BLOOD RIGHT ARM   Final    Special Requests BOTTLES DRAWN AEROBIC AND ANAEROBIC 10CC   Final    Culture  Setup Time 09/25/2012 17:08   Final    Culture     Final    Value:        BLOOD CULTURE RECEIVED NO GROWTH TO DATE CULTURE WILL BE HELD FOR 5 DAYS BEFORE ISSUING A FINAL NEGATIVE REPORT   Report Status PENDING   Incomplete    Medications: I have reviewed the patient's current medications. Scheduled Meds:    . Chlorhexidine Gluconate Cloth  6 each Topical Q0600  . enoxaparin (LOVENOX) injection   40 mg Subcutaneous Q24H  . hydrochlorothiazide  25 mg Oral Q breakfast  . insulin aspart  0-9 Units Subcutaneous TID AC & HS  . insulin aspart protamine-insulin aspart  14 Units Subcutaneous BID WC  . mupirocin ointment  1 application Nasal BID  . nicotine  14 mg Transdermal Daily  . vancomycin  1,000 mg Intravenous Q8H   Continuous Infusions:    . sodium chloride 100 mL/hr at 09/26/12 0625   PRN Meds:.acetaminophen, acetaminophen, alum & mag hydroxide-simeth, diphenhydrAMINE, HYDROmorphone (DILAUDID) injection, ondansetron (ZOFRAN) IV, ondansetron, oxyCODONE-acetaminophen Assessment/Plan: Linda Miller is a 56yo female who presented yesterday for R  foot pain. Overall she is still in significant pain and unable to walk on her foot. She has an elevated WBC count had other purulent infections recently on her buttocks and labia. Her diabetes is not well controlled, and she has a previously undiagnosed 1/6 systolic heart murmur of unclear etiology.   Right foot cellulitis and Left Buttock abcess. Status post irrigation debridement excision of the foot on 10/30. A partial calcaneal excision was also performed. Abscess gram stain reports gram positive but cultures that grew MRSA. In this patient with uncontrolled, type 2 diabetes, and history of recurrent skin infections with the most vaginal abscess that required I&D, there is a possibility that she has disseminated infection that is seeding into the foot hematogenously.patient responded to the IV Vanco treatment well. Her leukocytosis resolved. No fever.  Plan  - ID following, appreciate recommendations. Pt will need PICC placement for long term IV Abx (4 weeks).  - Orthopedics following. Appreciate recommendations on weight bearing, wound care, and follow up plan.  - Continue with wound care recommendations per ortho.  - she has had PT -who have recommended HH PT and Rolling Walker with 5 wheels  - Left buttock wound did not yield pus on  aspiration using a 18G needle. No surgical intervention was recommended by surgery.  - Continue Vanc.  - On pain control with Dilaudid   Possible Infective Endocarditis: She was found to have a murmur in the right sternal border of 2/6 and holosystolic. There are no other stigmata of infective endocarditis. Transthoracic echocardiogram revealed a possible small vegetation, and that tip of mitral valve. She did not quite meet the criteria for a diagnosis of IE - only one 1 major(ECHO) and 1 minor [facility] (fever) criteria for IE. Her TEE only shows some thickening on the valve and has no positive blood cultures. Fever is the only other criteria. Therefore, no indication at this time for treatment for endocarditis. Fever likely secondary to abscess. Patient started spiked fevers 3 days ago with Tmax of 102.47F. She has been afebrile for 24hours now. It is possible that this fever was 2/2 to her Tdap vaccination. WBC have actually improved from 31.5 on admission to 15.5 today. Per ID recommendations, discontinued the Unasyn given gram positive organisms on gram stain of the Right food wound drainage. Leukocytosis resolved. She is afebrile. Plan  - This is day #7 of vancomycin  - ID following, appreciate recommendations.  -  Initial blood cx with NGTD. Will repeat blood cultures if she spikes a fever again.  - Tissue culture with MRSA - Will continue to monitor WBC.   Uncontrolled Diabetes: The patient's home medications for diabetes include glimepiride and metformin. She also reports that she is been off medication for a long time due to cost. HbA1C is 15.6%.  Plan  -Insulin therapy with 14IU of 70/30 Novolog bid (increased from 10 units) in addition to meal coverage insulin. This will be a more affordable option compared to Lantus.  -[name] is assisting setting her up with IM clinic  - She will be set up with internal medicine clinic for outpatient follow up.   Hypertension: Patient's home medications  include hydrochlorothiazide. On admission her blood pressure was 110/86. She reports compliance with this medication.  Plan  -Will continue with home medications.   LOS: 6 days   Linda Miller 09/27/2012, 10:13 PM

## 2012-09-27 NOTE — Progress Notes (Signed)
Internal Medicine Teaching Service Attending Note Date: 09/27/2012  Patient name: Linda Miller  Medical record number: [id number]  Date of birth: 05/13/1956    This patient has been seen and discussed with the house staff. Please see their note for complete details. I concur with their findings with the following additions/corrections:  1. Foot wound s/p debridement - ID recommendations noted - Will arrange for PICC line and possible SNF placement for management of vancomycin for total of 4 weeks.   MULLEN, EMILY 09/27/2012, 8:23 PM

## 2012-09-27 NOTE — Progress Notes (Signed)
PT Cancellation Note  Patient Details Name: Linda Miller MRN: [id number] DOB: August 20, 1956   Cancelled Treatment:    Reason Eval/Treat Not Completed: Other (comment) (politely declining PT secondary to pain)  Will retry later today in conjunction with pain meds as time allows, otherwise will f/u tomorrow;    Van Clines [facility] 09/27/2012, 10:56 AM

## 2012-09-27 NOTE — Progress Notes (Signed)
[facility]    Date of Admission:  09/21/2012   Total days of antibiotics 7        Day 7 vanco        2 Day of amp/sub d/c'd 10/30        1 day of clinda d/c'd 10/28   ID: Linda Miller is a 56 y.o. female  with poorly controlled diabetes HbA1C of 15who presented on 10/29 with MRSA foot/heel abscess s/p I and D with partial calcaneal excision. She was also noted to have a murmur and a TTE was obtained and then a TEE which showed some thickening of the mitral valve vs. Small mass ? Possibly endocarditis  Principal Problem:  *Cellulitis of right foot Active Problems:  Diabetes mellitus, type II  Hypertension  Leukocytosis    Subjective: Afebrile. Having some foot pain at heal at site of surgery.   24hr: leukocytosis resolving Medications:     . [COMPLETED] bd getting started take home kit  1 kit Other Once  . Chlorhexidine Gluconate Cloth  6 each Topical Q0600  . enoxaparin (LOVENOX) injection  40 mg Subcutaneous Q24H  . hydrochlorothiazide  25 mg Oral Q breakfast  . insulin aspart  0-9 Units Subcutaneous TID AC & HS  . insulin aspart protamine-insulin aspart  14 Units Subcutaneous BID WC  . mupirocin ointment  1 application Nasal BID  . nicotine  14 mg Transdermal Daily  . vancomycin  1,000 mg Intravenous Q8H  . [DISCONTINUED] bd getting started take home kit  1 kit Other Once    Objective: Vital signs in last 24 hours: Temp:  [97.7 F (36.5 C)-98.7 F (37.1 C)] 97.7 F (36.5 C) (11/03 0547) Pulse Rate:  [60-94] 82  (11/03 0547) Resp:  [18] 18  (11/03 0547) BP: (118-134)/(71-86) 118/78 mmHg (11/03 0547) SpO2:  [95 %-100 %] 98 % (11/03 0547) Weight:  [144 lb 6.4 oz (65.5 kg)] 144 lb 6.4 oz (65.5 kg) (11/02 1831) General: Awake, alert, nad  Skin: foot with incision, stitches, clean wound bed. Mild tenderness at heal, no erythema, no fluctuance Lungs: CTA B  Cor: RRR SEM  Abdomen: soft, ntnd, +bs     Lab Results  [facility]  09/27/12 0552 09/26/12 0540  WBC 10.2 15.2*  HGB 11.1* 10.2*  HCT 34.5* 31.4*  NA 137 141  K 3.7 3.6  CL 100 102  CO2 31 29  BUN 4* 4*  CREATININE 0.53 0.49*  GLU -- --   TEE: There was a small mass versus thickening at the tip of the anterior mitral leaflet with mild to moderate MR. Normal LV systolic function, EF 60%. No vegetation on the AoV, TV, or PV.   Microbiology: 11/1 blood cx x 2 NGTD 10/31 blood cx x 2 NGTD 10/30 tissue cx : MRSA ( S clinda S tetracycline S bactrim S vanco I levo)  Studies/Results: No results found.   Assessment/Plan: MRSA deep tissue infection s/p debridement = continue with vancomycin. She will need picc line and recommend treat for 4 wks treatment of deep tissue infection. Dr. Lajoyce Corners managing further debridement and wound care recs  - will have her follow up in ID clinic in 3-4 wk to decide if she needs further abtx therapy  - she will need weekly vanco trough, CBC, CMP  While she is on vancomycin.  - primary team decide if she needs snf placement that can provide her vancomycin and wound care.  Endocarditis work - up =  blood cultures are negative, thus far, not meeting criteria for endocarditis, despite finding  thickened mitral valve vs. Mass on TEE. She is getting treated with 4 wks of IV antibiotics for MRSA deep tissue infection of heel to explain fevers and leukocytosis. We have no cardiac history to how long she may have had murmurs.  Will sign off, patient will have follow in ID clinic in 3  wks. Continue IV antibiotics until she is seen at [facility]. Call if questions  Drue Second [facility] Cell: [phone] Pager: [phone]  09/27/2012, 8:19 AM

## 2012-09-28 LAB — CULTURE, BLOOD (ROUTINE X 2): Culture: NO GROWTH

## 2012-09-28 LAB — GLUCOSE, CAPILLARY
Glucose-Capillary: 261 mg/dL — ABNORMAL HIGH (ref 70–99)
Glucose-Capillary: 278 mg/dL — ABNORMAL HIGH (ref 70–99)

## 2012-09-28 LAB — BASIC METABOLIC PANEL
BUN: 8 mg/dL (ref 6–23)
Creatinine, Ser: 0.56 mg/dL (ref 0.50–1.10)
GFR calc Af Amer: >90 mL/min (ref >90)
GFR calc non Af Amer: >90 mL/min (ref >90)

## 2012-09-28 LAB — ANAEROBIC CULTURE

## 2012-09-28 LAB — CBC
HCT: 34.2 % — ABNORMAL LOW (ref 36.0–46.0)
MCHC: 32.2 g/dL (ref 30.0–36.0)
MCV: 77.9 fL — ABNORMAL LOW (ref 78.0–100.0)
Platelets: 504 10*3/uL — ABNORMAL HIGH (ref 150–400)
RDW: 13.5 % (ref 11.5–15.5)

## 2012-09-28 MED ORDER — INSULIN ASPART 100 UNIT/ML ~~LOC~~ SOLN
0.0000 [IU] | Freq: Three times a day (TID) | SUBCUTANEOUS | Status: DC
  Administered 2012-09-29: 3 [IU] via SUBCUTANEOUS
  Administered 2012-09-29: 5 [IU] via SUBCUTANEOUS
  Administered 2012-09-29: 1 [IU] via SUBCUTANEOUS
  Administered 2012-09-30: 2 [IU] via SUBCUTANEOUS
  Administered 2012-09-30 (×2): 5 [IU] via SUBCUTANEOUS

## 2012-09-28 MED ORDER — VANCOMYCIN HCL IN DEXTROSE 1-5 GM/200ML-% IV SOLN: 1000.0000 mg | Freq: Three times a day (TID) | INTRAVENOUS | Status: DC

## 2012-09-28 NOTE — Progress Notes (Signed)
Inpatient Diabetes Program Recommendations  AACE/ADA: New Consensus Statement on Inpatient Glycemic Control (2013)  Target Ranges:  Prepandial:   less than 140 mg/dL      Peak postprandial:   less than 180 mg/dL (1-2 hours)      Critically ill patients:  140 - 180 mg/dL   Reason for Visit: Pt needs info on glucose monitoring financially available.  Inpatient Diabetes Program Recommendations Insulin - Basal: Would recommend an increase in 70/30 in the am to start to 17 units; probable need for 17 units ac supper as well.  If being discharged, can increase at OP visits. Insulin instruciton complete per bedside RN HgbA1C: [phone]  Note: Have given patient information on the [employer]iOn glucose meter and strips which are significantly less expensive than the traditional meters and strips, as is their insulins.abdominal tenderness [facility].

## 2012-09-28 NOTE — Progress Notes (Signed)
Subjective: She reports that her right foot is feeling "a lit bit better" today. Her pain is well controlled with IV dilaudid, she will like to transition to PO pain meds tomorrow before her possible discharge. Her left arm is not hurting as much. She asked me what has caused her "bad" infection and I explained to her that she needs tighter control of her diabetes. She explained that since [facility] has closed she had not been compliant with her metformin or her insulin.   Objective: Vital signs in last 24 hours: Filed Vitals:   09/27/12 1414 09/27/12 2140 09/28/12 0506 09/28/12 0958  BP: 108/71 115/69 105/68 106/62  Pulse: 85 85 74 80  Temp: 98.3 F (36.8 C) 98.2 F (36.8 C) 97.8 F (36.6 C) 98.2 F (36.8 C)  TempSrc: Oral Oral Oral Oral  Resp: 18 17 17 18   Height:      Weight:      SpO2: 100% 98% 100% 99%   Weight change:   Intake/Output Summary (Last 24 hours) at 09/28/12 1042 Last data filed at 09/27/12 1800  Gross per 24 hour  Intake   1590 ml  Output      0 ml  Net   1590 ml  Vitals reviewed.  General: sitting in bed, calm, in NAD  HEENT: no scleral icterus  Cardiac: RRR, no rubs, or gallops.  Pulm: clear to auscultation bilaterally, no wheezes, rales, or rhonchi  Abd: soft, nontender, nondistended, BS present  Ext: Left arm with erythema, tenderness, and erythema surrounding the site of her Tdap vaccine, much improved today. Right foot wound with no purulent discharge, sutures in place, no active bleeding. Right foot is exquisitely tender to gentle palpation. ROM limited 2/2 pain. Foot is warm with good pedal pulse.  Neuro: alert and oriented X3, cranial nerves II-XII grossly intact, sensation to light touch equal in bilateral upper and lower extremities  Lab Results: Basic Metabolic Panel:  Lab 09/28/12 7829 09/27/12 0552 09/23/12 1015  NA 137 137 --  K 3.8 3.7 --  CL 99 100 --  CO2 30 31 --  GLUCOSE 324* 259* --  BUN 8 4* --  CREATININE 0.56 0.53 --  CALCIUM  9.0 9.1 --  MG -- -- 1.8  PHOS -- -- --   Liver Function Tests:  Lab 09/22/12 0700  AST 7  ALT 5  ALKPHOS 75  BILITOT 0.2*  PROT 6.4  ALBUMIN 2.5*   CBC:  Lab 09/28/12 0507 09/27/12 0552 09/21/12 1234  WBC 9.6 10.2 --  NEUTROABS -- -- 27.8*  HGB 11.0* 11.1* --  HCT 34.2* 34.5* --  MCV 77.9* 78.2 --  PLT 504* 482* --   CBG:  Lab 09/28/12 0757 09/27/12 2131 09/27/12 1718 09/27/12 1214 09/27/12 0747 09/26/12 2140  GLUCAP 328* 233* 343* 180* 275* 275*   Hemoglobin A1C:  Lab 09/21/12 1750  HGBA1C 15.6*   Urinalysis:  Lab 09/22/12 1407  COLORURINE YELLOW  LABSPEC 1.018  PHURINE 5.0  GLUCOSEU >1000*  HGBUR NEGATIVE  BILIRUBINUR NEGATIVE  KETONESUR NEGATIVE  PROTEINUR NEGATIVE  UROBILINOGEN 0.2  NITRITE NEGATIVE  LEUKOCYTESUR SMALL*    Micro Results: Recent Results (from the past 240 hour(s))  CULTURE, BLOOD (ROUTINE X 2)     Status: Normal   Collection Time   09/22/12  7:00 AM      Component Value Range Status Comment   Specimen Description BLOOD LEFT ARM   Final    Special Requests BOTTLES DRAWN AEROBIC AND ANAEROBIC 10CC  Final    Culture  Setup Time 09/22/2012 13:44   Final    Culture NO GROWTH 5 DAYS   Final    Report Status 09/28/2012 FINAL   Final   CULTURE, BLOOD (ROUTINE X 2)     Status: Normal   Collection Time   09/22/12  7:05 AM      Component Value Range Status Comment   Specimen Description BLOOD LEFT ARM   Final    Special Requests BOTTLES DRAWN AEROBIC AND ANAEROBIC 10CC   Final    Culture  Setup Time 09/22/2012 13:44   Final    Culture NO GROWTH 5 DAYS   Final    Report Status 09/28/2012 FINAL   Final   URINE CULTURE     Status: Normal   Collection Time   09/22/12  2:07 PM      Component Value Range Status Comment   Specimen Description URINE, CLEAN CATCH   Final    Special Requests NONE   Final    Culture  Setup Time 09/22/2012 16:04   Final    Colony Count >=100,000 COLONIES/ML   Final    Culture ESCHERICHIA COLI   Final     Report Status 09/24/2012 FINAL   Final    Organism ID, Bacteria ESCHERICHIA COLI   Final   MRSA PCR SCREENING     Status: Normal   Collection Time   09/22/12  5:20 PM      Component Value Range Status Comment   MRSA by PCR NEGATIVE  NEGATIVE Final   SURGICAL PCR SCREEN     Status: Abnormal   Collection Time   09/23/12  2:00 AM      Component Value Range Status Comment   MRSA, PCR POSITIVE (*) NEGATIVE Final    Staphylococcus aureus POSITIVE (*) NEGATIVE Final   CULTURE, ROUTINE-ABSCESS     Status: Normal   Collection Time   09/23/12  8:45 AM      Component Value Range Status Comment   Specimen Description ABSCESS RIGHT FOOT   Final    Special Requests RIGHT HEEL PT ON ZOSYN AND VANCO   Final    Gram Stain     Final    Value: NO WBC SEEN     RARE SQUAMOUS EPITHELIAL CELLS PRESENT     RARE GRAM POSITIVE COCCI IN PAIRS   Culture     Final    Value: MODERATE METHICILLIN RESISTANT STAPHYLOCOCCUS AUREUS     Note: RIFAMPIN AND GENTAMICIN SHOULD NOT BE USED AS SINGLE DRUGS FOR TREATMENT OF STAPH INFECTIONS.     Note: CRITICAL RESULT CALLED TO, READ BACK BY AND VERIFIED WITH: ASHLEY MINOR @ 10:05AM  09/26/12 BY DWEEKS   Report Status 09/26/2012 FINAL   Final    Organism ID, Bacteria METHICILLIN RESISTANT STAPHYLOCOCCUS AUREUS   Final   ANAEROBIC CULTURE     Status: Normal (Preliminary result)   Collection Time   09/23/12  8:45 AM      Component Value Range Status Comment   Specimen Description ABSCESS RIGHT FOOT   Final    Special Requests RIGHT HEEL PT ON ZOSYN AND VANCO   Final    Gram Stain PENDING   Incomplete    Culture     Final    Value: NO ANAEROBES ISOLATED; CULTURE IN PROGRESS FOR 5 DAYS   Report Status PENDING   Incomplete   CULTURE, BLOOD (ROUTINE X 2)     Status: Normal (Preliminary result)  Collection Time   09/24/12 12:40 PM      Component Value Range Status Comment   Specimen Description BLOOD LEFT ARM   Final    Special Requests BOTTLES DRAWN AEROBIC AND ANAEROBIC  10CC   Final    Culture  Setup Time 09/24/2012 16:59   Final    Culture     Final    Value:        BLOOD CULTURE RECEIVED NO GROWTH TO DATE CULTURE WILL BE HELD FOR 5 DAYS BEFORE ISSUING A FINAL NEGATIVE REPORT   Report Status PENDING   Incomplete   CULTURE, BLOOD (ROUTINE X 2)     Status: Normal (Preliminary result)   Collection Time   09/24/12 12:50 PM      Component Value Range Status Comment   Specimen Description BLOOD LEFT HAND   Final    Special Requests BOTTLES DRAWN AEROBIC ONLY 5CC   Final    Culture  Setup Time 09/24/2012 16:59   Final    Culture     Final    Value:        BLOOD CULTURE RECEIVED NO GROWTH TO DATE CULTURE WILL BE HELD FOR 5 DAYS BEFORE ISSUING A FINAL NEGATIVE REPORT   Report Status PENDING   Incomplete   CULTURE, BLOOD (ROUTINE X 2)     Status: Normal (Preliminary result)   Collection Time   09/25/12 12:35 PM      Component Value Range Status Comment   Specimen Description BLOOD RIGHT ARM   Final    Special Requests BOTTLES DRAWN AEROBIC AND ANAEROBIC 10CC   Final    Culture  Setup Time 09/25/2012 17:08   Final    Culture     Final    Value:        BLOOD CULTURE RECEIVED NO GROWTH TO DATE CULTURE WILL BE HELD FOR 5 DAYS BEFORE ISSUING A FINAL NEGATIVE REPORT   Report Status PENDING   Incomplete   CULTURE, BLOOD (ROUTINE X 2)     Status: Normal (Preliminary result)   Collection Time   09/25/12 12:42 PM      Component Value Range Status Comment   Specimen Description BLOOD RIGHT ARM   Final    Special Requests BOTTLES DRAWN AEROBIC AND ANAEROBIC 10CC   Final    Culture  Setup Time 09/25/2012 17:08   Final    Culture     Final    Value:        BLOOD CULTURE RECEIVED NO GROWTH TO DATE CULTURE WILL BE HELD FOR 5 DAYS BEFORE ISSUING A FINAL NEGATIVE REPORT   Report Status PENDING   Incomplete    Studies/Results: No results found. Medications: I have reviewed the patient's current medications. Scheduled Meds:   . [EXPIRED] Chlorhexidine Gluconate Cloth  6  each Topical Q0600  . enoxaparin (LOVENOX) injection  40 mg Subcutaneous Q24H  . hydrochlorothiazide  25 mg Oral Q breakfast  . insulin aspart  0-9 Units Subcutaneous TID AC & HS  . insulin aspart protamine-insulin aspart  14 Units Subcutaneous BID WC  . [EXPIRED] mupirocin ointment  1 application Nasal BID  . nicotine  14 mg Transdermal Daily  . vancomycin  1,000 mg Intravenous Q8H   Continuous Infusions:   . sodium chloride 100 mL/hr at 09/28/12 0837   PRN Meds:.acetaminophen, acetaminophen, alum & mag hydroxide-simeth, diphenhydrAMINE, HYDROmorphone (DILAUDID) injection, ondansetron (ZOFRAN) IV, ondansetron, oxyCODONE-acetaminophen Assessment/Plan: Linda Miller is a 56yo female who presented for R foot pain, other  purulent infections recently on her buttocks and labia. Her diabetes is not well controlled, and she has a previously undiagnosed 1/6 systolic heart murmur of unclear etiology.   Right foot cellulitis and Left Buttock abcess. Status post irrigation debridement excision of the foot on 10/30. A partial calcaneal excision was also performed. Abscess gram stain reports gram positive but cultures that grew MRSA. In this patient with uncontrolled, type 2 diabetes, and history of recurrent skin infections with the most vaginal abscess that required I&D, there is a possibility that she has disseminated infection that is seeding into the foot hematogenously. She has responded to the IV Vanco treatment well. Her leukocytosis resolved. No fever for >3 days now.   Plan  - ID following, appreciate recommendations.  - IV team to place PICC line today for long term IV Abx (4 weeks post-op).  - Case manager consulted, pt to go home with [facility] RN for vanc administration, pharmacy protocol.  - Orthopedics following. Appreciate recommendations on weight bearing, wound care, and follow up plan.  - Continue with wound care recommendations per ortho.  - she has had PT -who have recommended HH PT and  Rolling Walker with 5 wheels  - Left buttock wound did not yield pus on aspiration using a 18G needle. No surgical intervention was recommended by surgery.  - Continue Vanc.  - On pain control with Dilaudid  - She will be discharged home with [facility] RN, PT, and SW  Possible Infective Endocarditis: She was found to have a murmur in the right sternal border of 2/6 and holosystolic. There are no other stigmata of infective endocarditis. Transthoracic echocardiogram revealed a possible small vegetation, and that tip of mitral valve. She did not quite meet the criteria for a diagnosis of IE - only one 1 major(ECHO) and 1 minor [facility] (fever) criteria for IE. Her TEE only shows some thickening on the valve and has no positive blood cultures. Fever is the only other criteria. Therefore, no indication at this time for treatment for endocarditis. Fever likely secondary to abscess. Patient started spiked fevers 3 days ago with Tmax of 102.77F. She has been afebrile for 24hours now. It is possible that this fever was 2/2 to her Tdap vaccination. WBC have actually improved from 31.5 on admission to 15.5 today. Per ID recommendations, discontinued the Unasyn given gram positive organisms on gram stain of the Right food wound drainage. Leukocytosis resolved. She is afebrile.  Plan  - This is day #8 of vancomycin  - ID following, appreciate recommendations.  - Initial blood cx with NGTD. Will repeat blood cultures if she spikes a fever again.  - Tissue culture with MRSA  - Will continue to monitor WBC.   Uncontrolled Diabetes: The patient's home medications for diabetes include glimepiride and metformin. She also reports that she is been off medication for a long time due to cost. HbA1C is 15.6%.  Plan  -Insulin therapy with 14IU of 70/30 Novolog bid (increased from 10 units) in addition to meal coverage insulin. This will be a more affordable option compared to Lantus.  -[name] is assisting setting her up with IM clinic    - She will be set up with internal medicine clinic for outpatient follow up.  - SW actively working on assisting the patient with her insulin, other medications, and financial hardship (no utilities, no rent payment)  Hypertension: Patient's home medications include hydrochlorothiazide. On admission her blood pressure was 110/86. She reports compliance with this medication.  Plan  -  Will continue with home medications.  Dispo. Patient might go home tomorrow if SW is able to secure pastoral help with pt's rent/utilities. Case manager actively involved, paper copy of prescription of vancomycin to be placed in pt's chart this afternoon.    LOS: 7 days   Ky Barban 09/28/2012, 10:43 AM

## 2012-09-28 NOTE — Progress Notes (Signed)
Clinical Social Work-CSw spoke at length with pt  Regarding plan for d/c home-pt relays that she is able to gather support for rent assistance and medication management-CSW spoke with pastoral care who is able to provide assistance with energy bill and reinstatement-CSW will facilitate in AM once all details regarding home needs/medicaitons are met- Jodean Lima, [phone]

## 2012-09-28 NOTE — Progress Notes (Signed)
Internal Medicine Teaching Service Attending Note Date: 09/28/2012  Patient name: Linda Miller  Medical record number: [id number]  Date of birth: [birth year]    This patient has been seen and discussed with the house staff. Please see their note for complete details. I concur with their findings with the following additions/corrections:  Vancomycin for 4 weeks post surgery, follow up with ID and Orthopedics  [facility] and teaching for Vancomycin dosing at home.   Patient does not meet criteria for infective endocarditis.   MULLEN, EMILY 09/28/2012, 8:27 PM

## 2012-09-28 NOTE — Progress Notes (Signed)
PT Cancellation Note  Patient Details Name: Linda Miller MRN: [id number] DOB: 03/26/1956   Cancelled Treatment:    Reason Eval/Treat Not Completed: Other (comment) (pt waiting on MD and CM for D/C.  )pt states she is D/Cing to home and wants to wait until MD and CM see her this afternoon.  Will f/u another time if pt does not D/C.  If pt continues to refuse PT, will D/C from PT services.     Sunny Schlein, Brent [phone] 09/28/2012, 2:36 PM

## 2012-09-29 LAB — GLUCOSE, CAPILLARY

## 2012-09-29 LAB — CBC
MCH: 25.1 pg — ABNORMAL LOW (ref 26.0–34.0)
MCV: 78.1 fL (ref 78.0–100.0)
Platelets: 513 10*3/uL — ABNORMAL HIGH (ref 150–400)
RDW: 13.8 % (ref 11.5–15.5)
WBC: 9.7 10*3/uL (ref 4.0–10.5)

## 2012-09-29 LAB — URINALYSIS, ROUTINE W REFLEX MICROSCOPIC
Bilirubin Urine: NEGATIVE
Glucose, UA: 250 mg/dL — AB
Hgb urine dipstick: NEGATIVE
Ketones, ur: NEGATIVE mg/dL
Leukocytes, UA: NEGATIVE
Protein, ur: NEGATIVE mg/dL
pH: 6.5 (ref 5.0–8.0)

## 2012-09-29 LAB — BASIC METABOLIC PANEL
Calcium: 8.8 mg/dL (ref 8.4–10.5)
Chloride: 99 mEq/L (ref 96–112)
Creatinine, Ser: 0.48 mg/dL — ABNORMAL LOW (ref 0.50–1.10)
GFR calc Af Amer: >90 mL/min (ref >90)
Sodium: 134 mEq/L — ABNORMAL LOW (ref 135–145)

## 2012-09-29 MED ORDER — SODIUM CHLORIDE 0.9 % IJ SOLN
10.0000 mL | INTRAMUSCULAR | Status: DC | PRN
  Administered 2012-09-30: 10 mL

## 2012-09-29 MED ORDER — HYDROMORPHONE HCL PF 1 MG/ML IJ SOLN
1.0000 mg | INTRAMUSCULAR | Status: DC | PRN
  Administered 2012-09-29 – 2012-09-30 (×3): 1 mg via INTRAVENOUS
  Filled 2012-09-29 (×3): qty 1

## 2012-09-29 NOTE — Progress Notes (Signed)
Internal Medicine Teaching Service Attending Note Date: 09/29/2012  Patient name: Linda Miller  Medical record number: [id number]  Date of birth: [birth year]    This patient has been seen and discussed with the house staff. Please see their note for complete details. I concur with their findings with the following additions/corrections:  PICC line placed today, patient has had teaching re: Abx administration at home.   She is medically clear to go home, but has Discharge needs (Home health, etc) which will need to be set up before she can safely be discharged.  While in house, will continue to manage her uncontrolled diabetes and continue physical therapy and wound care.   Bardia Wangerin 09/29/2012, 2:13 PM

## 2012-09-29 NOTE — Progress Notes (Signed)
Peripherally Inserted Central Catheter/Midline Placement  The IV Nurse has discussed with the patient and/or persons authorized to consent for the patient, the purpose of this procedure and the potential benefits and risks involved with this procedure.  The benefits include less needle sticks, lab draws from the catheter and patient may be discharged home with the catheter.  Risks include, but not limited to, infection, bleeding, blood clot (thrombus formation), and puncture of an artery; nerve damage and irregular heat beat.  Alternatives to this procedure were also discussed.  PICC/Midline Placement Documentation        Linda Miller 09/29/2012, 9:28 AM

## 2012-09-29 NOTE — Progress Notes (Signed)
Nutrition Brief Note  Patient identified on the Malnutrition Screening Tool (MST) Report. Patient reports a good appetite with no recent unintentional weight loss.  Body mass index is 25.58 kg/(m^2). Pt meets criteria for Overweight based on current BMI.   Current diet order is Carbohydrate Modified Medium Calorie, patient is consuming approximately 100% of meals at this time. Labs and medications reviewed.   No nutrition interventions warranted at this time. If nutrition issues arise, please consult RD.   Kirkland Hun, RD, LDN Pager #: [phone] After-Hours Pager #: [phone]

## 2012-09-29 NOTE — Progress Notes (Signed)
ANTIBIOTIC CONSULT NOTE - FOLLOW UP  Pharmacy Consult for Vancomycin Indication: MRSA foot/heel abscess  Allergies  Allergen Reactions  . Codeine Other (See Comments)    Throat Swelling and itching  . Tramadol Hives and Itching    Patient Measurements: Height: 5\' 3"  (160 cm) Weight: 144 lb 6.4 oz (65.5 kg) IBW/kg (Calculated) : 52.4  Adjusted Body Weight:   Vital Signs: Temp: 98.1 F (36.7 C) (11/05 0451) Temp src: Oral (11/05 0451) BP: 122/74 mmHg (11/05 0451) Pulse Rate: 85  (11/05 0451) Intake/Output from previous day: 11/04 0701 - 11/05 0700 In: 4101.7 [P.O.:560; I.V.:3541.7] Out: -  Intake/Output from this shift: Total I/O In: 240 [P.O.:240] Out: -   Labs:  [facility] 09/29/12 0800 09/28/12 0507 09/27/12 0552  WBC 9.7 9.6 10.2  HGB 10.3* 11.0* 11.1*  PLT 513* 504* 482*  LABCREA -- -- --  CREATININE 0.48* 0.56 0.53   Estimated Creatinine Clearance: 73.1 ml/min (by C-G formula based on Cr of 0.48).  Basename 09/26/12 2202  VANCOTROUGH 15.2  VANCOPEAK --  VANCORANDOM --  GENTTROUGH --  GENTPEAK --  GENTRANDOM --  TOBRATROUGH --  TOBRAPEAK --  TOBRARND --  AMIKACINPEAK --  AMIKACINTROU --  AMIKACIN --     Microbiology: Recent Results (from the past 720 hour(s))  WET PREP, GENITAL     Status: Abnormal   Collection Time   09/07/12  4:09 PM      Component Value Range Status Comment   Yeast Wet Prep HPF POC NONE SEEN  NONE SEEN Final    Trich, Wet Prep NONE SEEN  NONE SEEN Final    Clue Cells Wet Prep HPF POC FEW (*) NONE SEEN Final    WBC, Wet Prep HPF POC FEW (*) NONE SEEN Final   CULTURE, BLOOD (ROUTINE X 2)     Status: Normal   Collection Time   09/22/12  7:00 AM      Component Value Range Status Comment   Specimen Description BLOOD LEFT ARM   Final    Special Requests BOTTLES DRAWN AEROBIC AND ANAEROBIC 10CC   Final    Culture  Setup Time 09/22/2012 13:44   Final    Culture NO GROWTH 5 DAYS   Final    Report Status 09/28/2012 FINAL   Final    CULTURE, BLOOD (ROUTINE X 2)     Status: Normal   Collection Time   09/22/12  7:05 AM      Component Value Range Status Comment   Specimen Description BLOOD LEFT ARM   Final    Special Requests BOTTLES DRAWN AEROBIC AND ANAEROBIC 10CC   Final    Culture  Setup Time 09/22/2012 13:44   Final    Culture NO GROWTH 5 DAYS   Final    Report Status 09/28/2012 FINAL   Final   URINE CULTURE     Status: Normal   Collection Time   09/22/12  2:07 PM      Component Value Range Status Comment   Specimen Description URINE, CLEAN CATCH   Final    Special Requests NONE   Final    Culture  Setup Time 09/22/2012 16:04   Final    Colony Count >=100,000 COLONIES/ML   Final    Culture ESCHERICHIA COLI   Final    Report Status 09/24/2012 FINAL   Final    Organism ID, Bacteria ESCHERICHIA COLI   Final   MRSA PCR SCREENING     Status: Normal   Collection Time  09/22/12  5:20 PM      Component Value Range Status Comment   MRSA by PCR NEGATIVE  NEGATIVE Final   SURGICAL PCR SCREEN     Status: Abnormal   Collection Time   09/23/12  2:00 AM      Component Value Range Status Comment   MRSA, PCR POSITIVE (*) NEGATIVE Final    Staphylococcus aureus POSITIVE (*) NEGATIVE Final   CULTURE, ROUTINE-ABSCESS     Status: Normal   Collection Time   09/23/12  8:45 AM      Component Value Range Status Comment   Specimen Description ABSCESS RIGHT FOOT   Final    Special Requests RIGHT HEEL PT ON ZOSYN AND VANCO   Final    Gram Stain     Final    Value: NO WBC SEEN     RARE SQUAMOUS EPITHELIAL CELLS PRESENT     RARE GRAM POSITIVE COCCI IN PAIRS   Culture     Final    Value: MODERATE METHICILLIN RESISTANT STAPHYLOCOCCUS AUREUS     Note: RIFAMPIN AND GENTAMICIN SHOULD NOT BE USED AS SINGLE DRUGS FOR TREATMENT OF STAPH INFECTIONS.     Note: CRITICAL RESULT CALLED TO, READ BACK BY AND VERIFIED WITH: ASHLEY MINOR @ 10:05AM  09/26/12 BY DWEEKS   Report Status 09/26/2012 FINAL   Final    Organism ID, Bacteria  METHICILLIN RESISTANT STAPHYLOCOCCUS AUREUS   Final   ANAEROBIC CULTURE     Status: Normal   Collection Time   09/23/12  8:45 AM      Component Value Range Status Comment   Specimen Description ABSCESS RIGHT FOOT   Final    Special Requests RIGHT HEEL PT ON ZOSYN AND VANCO   Final    Gram Stain     Final    Value: NO WBC SEEN     RARE SQUAMOUS EPITHELIAL CELLS PRESENT     RARE GRAM POSITIVE COCCI     IN PAIRS   Culture NO ANAEROBES ISOLATED   Final    Report Status 09/28/2012 FINAL   Final   CULTURE, BLOOD (ROUTINE X 2)     Status: Normal (Preliminary result)   Collection Time   09/24/12 12:40 PM      Component Value Range Status Comment   Specimen Description BLOOD LEFT ARM   Final    Special Requests BOTTLES DRAWN AEROBIC AND ANAEROBIC 10CC   Final    Culture  Setup Time 09/24/2012 16:59   Final    Culture     Final    Value:        BLOOD CULTURE RECEIVED NO GROWTH TO DATE CULTURE WILL BE HELD FOR 5 DAYS BEFORE ISSUING A FINAL NEGATIVE REPORT   Report Status PENDING   Incomplete   CULTURE, BLOOD (ROUTINE X 2)     Status: Normal (Preliminary result)   Collection Time   09/24/12 12:50 PM      Component Value Range Status Comment   Specimen Description BLOOD LEFT HAND   Final    Special Requests BOTTLES DRAWN AEROBIC ONLY 5CC   Final    Culture  Setup Time 09/24/2012 16:59   Final    Culture     Final    Value:        BLOOD CULTURE RECEIVED NO GROWTH TO DATE CULTURE WILL BE HELD FOR 5 DAYS BEFORE ISSUING A FINAL NEGATIVE REPORT   Report Status PENDING   Incomplete   CULTURE, BLOOD (ROUTINE X  2)     Status: Normal (Preliminary result)   Collection Time   09/25/12 12:35 PM      Component Value Range Status Comment   Specimen Description BLOOD RIGHT ARM   Final    Special Requests BOTTLES DRAWN AEROBIC AND ANAEROBIC 10CC   Final    Culture  Setup Time 09/25/2012 17:08   Final    Culture     Final    Value:        BLOOD CULTURE RECEIVED NO GROWTH TO DATE CULTURE WILL BE HELD FOR 5  DAYS BEFORE ISSUING A FINAL NEGATIVE REPORT   Report Status PENDING   Incomplete   CULTURE, BLOOD (ROUTINE X 2)     Status: Normal (Preliminary result)   Collection Time   09/25/12 12:42 PM      Component Value Range Status Comment   Specimen Description BLOOD RIGHT ARM   Final    Special Requests BOTTLES DRAWN AEROBIC AND ANAEROBIC 10CC   Final    Culture  Setup Time 09/25/2012 17:08   Final    Culture     Final    Value:        BLOOD CULTURE RECEIVED NO GROWTH TO DATE CULTURE WILL BE HELD FOR 5 DAYS BEFORE ISSUING A FINAL NEGATIVE REPORT   Report Status PENDING   Incomplete     Anti-infectives     Start     Dose/Rate Route Frequency Ordered Stop   09/28/12 0000   vancomycin (VANCOCIN) 1 GM/200ML SOLN     Comments: Please give per pharmacy protocol.      1,000 mg 200 mL/hr over 60 Minutes Intravenous Every 8 hours 09/28/12 1449     09/24/12 2200   vancomycin (VANCOCIN) IVPB 1000 mg/200 mL premix        1,000 mg 200 mL/hr over 60 Minutes Intravenous 3 times per day 09/24/12 1832     09/22/12 1430   Ampicillin-Sulbactam (UNASYN) 3 g in sodium chloride 0.9 % 100 mL IVPB  Status:  Discontinued        3 g 100 mL/hr over 60 Minutes Intravenous Every 6 hours 09/22/12 1323 09/24/12 1137   09/22/12 0500   vancomycin (VANCOCIN) 750 mg in sodium chloride 0.9 % 150 mL IVPB  Status:  Discontinued        750 mg 150 mL/hr over 60 Minutes Intravenous Every 12 hours 09/21/12 1742 09/24/12 1830   09/21/12 1700   vancomycin (VANCOCIN) 750 mg in sodium chloride 0.9 % 150 mL IVPB        750 mg 150 mL/hr over 60 Minutes Intravenous  Once 09/21/12 1628 09/21/12 1752   09/21/12 1400   clindamycin (CLEOCIN) IVPB 900 mg  Status:  Discontinued        900 mg 100 mL/hr over 30 Minutes Intravenous 3 times per day 09/21/12 1234 09/21/12 1742          Assessment: 55yof on Day 9 of Vancomycin for MRSA foot/heel abscess (deep tissue infection). Patient remains afebrile and WBC wnl. Patient's renal  function has remained stable (SCr ~0.5, Crcl ~73). Patient has been receiving Vancomycin 1g IV q8h which produced a therapeutic trough of 15.2 on 11/2. Noted ID recommendations of continuing Vancomycin for 3-4 weeks and reevaluating for possible continuation.  Goal of Therapy:  Vancomycin trough level 15-20 mcg/ml  Plan:  1. Continue Vancomycin 1g IV q8h 2. Recommend weekly Vancomycin levels and SCr - check next on Friday 11/8  Cleon Dew [phone]  09/29/2012,1:38 PM

## 2012-09-29 NOTE — Progress Notes (Signed)
Discussed diabetes care of patient with Dr. Garald Braver. Patient to be discharged on Novolin/Humulin/ReliOn 70/30 vials. Spoke with Patient by phone (Patient was sleeping soundly at visit,) and she plans to make an appointment to meet  with [facility] Financial counselor next Tuesday to Apply for orange card She says she can afford the [facility] for her insulin and another 6$ for a meter and strips from [facility]. Patient acknowledged receipt and understanding of  left Insulin starter kit & samples of insulin syringes (30) at bedside with information about MAP and what she'll need to bring to apply for the orange card. Told her she should also have appointments with CDE and doctor at discharge. Ms Shiveley has phone number of CDE for questions.

## 2012-09-29 NOTE — Progress Notes (Signed)
Subjective: Her foot feels better today although she hit it at the end on the bed while going to the bathroom. Her left arm is no longer sore.   Objective: Vital signs in last 24 hours: Filed Vitals:   09/28/12 0506 09/28/12 0958 09/28/12 2133 09/29/12 0451  BP: 105/68 106/62 112/74 122/74  Pulse: 74 80 83 85  Temp: 97.8 F (36.6 C) 98.2 F (36.8 C) 98.1 F (36.7 C) 98.1 F (36.7 C)  TempSrc: Oral Oral Oral Oral  Resp: 17 18 18 18   Height:      Weight:      SpO2: 100% 99% 97% 100%   Weight change:   Intake/Output Summary (Last 24 hours) at 09/29/12 0806 Last data filed at 09/29/12 0525  Gross per 24 hour  Intake 4101.67 ml  Output      0 ml  Net 4101.67 ml   Vitals reviewed.  General: sitting in bed, calm, in NAD HEENT: no scleral icterus  Cardiac: RRR, no rubs, or gallops.  Pulm: clear to auscultation bilaterally, no wheezes, rales, or rhonchi  Abd: soft, nontender, nondistended, BS present  Ext: Left arm with no erythema, a small "knot" at the site of the Tdap vaccine, much improved today. Right foot wound with no purulent discharge, sutures in place, no active bleeding, surrounded by granulation tissue. Right foot is not as tender to palpation. ROM limited 2/2 pain but improved from yesterday. Foot is warm with good pedal pulse.  Neuro: alert and oriented X3, cranial nerves II-XII grossly intact, sensation to light touch equal in bilateral upper and lower extremities  Lab Results: Basic Metabolic Panel:  Lab 09/28/12 4403 09/27/12 0552 09/23/12 1015  NA 137 137 --  K 3.8 3.7 --  CL 99 100 --  CO2 30 31 --  GLUCOSE 324* 259* --  BUN 8 4* --  CREATININE 0.56 0.53 --  CALCIUM 9.0 9.1 --  MG -- -- 1.8  PHOS -- -- --   CBC:  Lab 09/28/12 0507 09/27/12 0552  WBC 9.6 10.2  NEUTROABS -- --  HGB 11.0* 11.1*  HCT 34.2* 34.5*  MCV 77.9* 78.2  PLT 504* 482*   CBG:  Lab 09/28/12 2151 09/28/12 1222 09/28/12 0757 09/27/12 2131 09/27/12 1718 09/27/12 1214  GLUCAP  278* 261* 328* 233* 343* 180*   Urinalysis:  Lab 09/22/12 1407  COLORURINE YELLOW  LABSPEC 1.018  PHURINE 5.0  GLUCOSEU >1000*  HGBUR NEGATIVE  BILIRUBINUR NEGATIVE  KETONESUR NEGATIVE  PROTEINUR NEGATIVE  UROBILINOGEN 0.2  NITRITE NEGATIVE  LEUKOCYTESUR SMALL*   Micro Results: Recent Results (from the past 240 hour(s))  CULTURE, BLOOD (ROUTINE X 2)     Status: Normal   Collection Time   09/22/12  7:00 AM      Component Value Range Status Comment   Specimen Description BLOOD LEFT ARM   Final    Special Requests BOTTLES DRAWN AEROBIC AND ANAEROBIC 10CC   Final    Culture  Setup Time 09/22/2012 13:44   Final    Culture NO GROWTH 5 DAYS   Final    Report Status 09/28/2012 FINAL   Final   CULTURE, BLOOD (ROUTINE X 2)     Status: Normal   Collection Time   09/22/12  7:05 AM      Component Value Range Status Comment   Specimen Description BLOOD LEFT ARM   Final    Special Requests BOTTLES DRAWN AEROBIC AND ANAEROBIC 10CC   Final    Culture  Setup  Time 09/22/2012 13:44   Final    Culture NO GROWTH 5 DAYS   Final    Report Status 09/28/2012 FINAL   Final   URINE CULTURE     Status: Normal   Collection Time   09/22/12  2:07 PM      Component Value Range Status Comment   Specimen Description URINE, CLEAN CATCH   Final    Special Requests NONE   Final    Culture  Setup Time 09/22/2012 16:04   Final    Colony Count >=100,000 COLONIES/ML   Final    Culture ESCHERICHIA COLI   Final    Report Status 09/24/2012 FINAL   Final    Organism ID, Bacteria ESCHERICHIA COLI   Final   MRSA PCR SCREENING     Status: Normal   Collection Time   09/22/12  5:20 PM      Component Value Range Status Comment   MRSA by PCR NEGATIVE  NEGATIVE Final   SURGICAL PCR SCREEN     Status: Abnormal   Collection Time   09/23/12  2:00 AM      Component Value Range Status Comment   MRSA, PCR POSITIVE (*) NEGATIVE Final    Staphylococcus aureus POSITIVE (*) NEGATIVE Final   CULTURE, ROUTINE-ABSCESS      Status: Normal   Collection Time   09/23/12  8:45 AM      Component Value Range Status Comment   Specimen Description ABSCESS RIGHT FOOT   Final    Special Requests RIGHT HEEL PT ON ZOSYN AND VANCO   Final    Gram Stain     Final    Value: NO WBC SEEN     RARE SQUAMOUS EPITHELIAL CELLS PRESENT     RARE GRAM POSITIVE COCCI IN PAIRS   Culture     Final    Value: MODERATE METHICILLIN RESISTANT STAPHYLOCOCCUS AUREUS     Note: RIFAMPIN AND GENTAMICIN SHOULD NOT BE USED AS SINGLE DRUGS FOR TREATMENT OF STAPH INFECTIONS.     Note: CRITICAL RESULT CALLED TO, READ BACK BY AND VERIFIED WITH: ASHLEY MINOR @ 10:05AM  09/26/12 BY DWEEKS   Report Status 09/26/2012 FINAL   Final    Organism ID, Bacteria METHICILLIN RESISTANT STAPHYLOCOCCUS AUREUS   Final   ANAEROBIC CULTURE     Status: Normal   Collection Time   09/23/12  8:45 AM      Component Value Range Status Comment   Specimen Description ABSCESS RIGHT FOOT   Final    Special Requests RIGHT HEEL PT ON ZOSYN AND VANCO   Final    Gram Stain     Final    Value: NO WBC SEEN     RARE SQUAMOUS EPITHELIAL CELLS PRESENT     RARE GRAM POSITIVE COCCI     IN PAIRS   Culture NO ANAEROBES ISOLATED   Final    Report Status 09/28/2012 FINAL   Final   CULTURE, BLOOD (ROUTINE X 2)     Status: Normal (Preliminary result)   Collection Time   09/24/12 12:40 PM      Component Value Range Status Comment   Specimen Description BLOOD LEFT ARM   Final    Special Requests BOTTLES DRAWN AEROBIC AND ANAEROBIC 10CC   Final    Culture  Setup Time 09/24/2012 16:59   Final    Culture     Final    Value:        BLOOD CULTURE RECEIVED NO GROWTH TO  DATE CULTURE WILL BE HELD FOR 5 DAYS BEFORE ISSUING A FINAL NEGATIVE REPORT   Report Status PENDING   Incomplete   CULTURE, BLOOD (ROUTINE X 2)     Status: Normal (Preliminary result)   Collection Time   09/24/12 12:50 PM      Component Value Range Status Comment   Specimen Description BLOOD LEFT HAND   Final    Special  Requests BOTTLES DRAWN AEROBIC ONLY 5CC   Final    Culture  Setup Time 09/24/2012 16:59   Final    Culture     Final    Value:        BLOOD CULTURE RECEIVED NO GROWTH TO DATE CULTURE WILL BE HELD FOR 5 DAYS BEFORE ISSUING A FINAL NEGATIVE REPORT   Report Status PENDING   Incomplete   CULTURE, BLOOD (ROUTINE X 2)     Status: Normal (Preliminary result)   Collection Time   09/25/12 12:35 PM      Component Value Range Status Comment   Specimen Description BLOOD RIGHT ARM   Final    Special Requests BOTTLES DRAWN AEROBIC AND ANAEROBIC 10CC   Final    Culture  Setup Time 09/25/2012 17:08   Final    Culture     Final    Value:        BLOOD CULTURE RECEIVED NO GROWTH TO DATE CULTURE WILL BE HELD FOR 5 DAYS BEFORE ISSUING A FINAL NEGATIVE REPORT   Report Status PENDING   Incomplete   CULTURE, BLOOD (ROUTINE X 2)     Status: Normal (Preliminary result)   Collection Time   09/25/12 12:42 PM      Component Value Range Status Comment   Specimen Description BLOOD RIGHT ARM   Final    Special Requests BOTTLES DRAWN AEROBIC AND ANAEROBIC 10CC   Final    Culture  Setup Time 09/25/2012 17:08   Final    Culture     Final    Value:        BLOOD CULTURE RECEIVED NO GROWTH TO DATE CULTURE WILL BE HELD FOR 5 DAYS BEFORE ISSUING A FINAL NEGATIVE REPORT   Report Status PENDING   Incomplete    Studies/Results: No results found. Medications: I have reviewed the patient's current medications. Scheduled Meds:   . enoxaparin (LOVENOX) injection  40 mg Subcutaneous Q24H  . hydrochlorothiazide  25 mg Oral Q breakfast  . insulin aspart  0-9 Units Subcutaneous TID WC  . insulin aspart protamine-insulin aspart  14 Units Subcutaneous BID WC  . [EXPIRED] mupirocin ointment  1 application Nasal BID  . nicotine  14 mg Transdermal Daily  . vancomycin  1,000 mg Intravenous Q8H  . [DISCONTINUED] insulin aspart  0-9 Units Subcutaneous TID AC & HS   Continuous Infusions:   . sodium chloride 100 mL/hr at 09/29/12 0525     PRN Meds:.acetaminophen, acetaminophen, alum & mag hydroxide-simeth, diphenhydrAMINE, HYDROmorphone (DILAUDID) injection, ondansetron (ZOFRAN) IV, ondansetron, oxyCODONE-acetaminophen Assessment/Plan: Mrs. Hedtke is a 56yo female who presented for R foot pain, other purulent infections recently on her buttocks and labia. Her diabetes is not well controlled, and she has a previously undiagnosed 1/6 systolic heart murmur of unclear etiology.   Right foot cellulitis and Left Buttock abcess. Status post irrigation debridement excision of the foot on 10/30. A partial calcaneal excision was also performed. Abscess gram stain reports gram positive but cultures that grew MRSA. In this patient with uncontrolled, type 2 diabetes, and history of recurrent skin infections  with the most vaginal abscess that required I&D, there is a possibility that she has disseminated infection that is seeding into the foot hematogenously. She has responded to the IV Vanco treatment well. Her leukocytosis resolved. No fever for >3 days now.  Plan  - ID following, appreciate recommendations for antibiotic treatment duration  - IV team to place PICC line today for long term IV Abx (4 weeks post-op).  - Case manager consulted, pt to go home with [facility] RN for vanc administration, pharmacy protocol.  - Orthopedics following. Appreciate recommendations on weight bearing, wound care, and follow up plan.  - Continue with wound care recommendations per ortho.  - she has had PT -who have recommended HH PT and Rolling Walker with 5 wheels  - Left buttock wound did not yield pus on aspiration using a 18G needle. No surgical intervention was recommended by surgery.  - Continue Vanc.  - On pain control with Dilaudid  - She will be discharged home with [facility] RN, PT, and SW   Possible Infective Endocarditis: She was found to have a murmur in the right sternal border of 2/6 and holosystolic. There are no other stigmata of infective endocarditis.  Transthoracic echocardiogram revealed a possible small vegetation, and that tip of mitral valve. She did not quite meet the criteria for a diagnosis of IE - only one 1 major(ECHO) and 1 minor [place] (fever) criteria for IE. Her TEE only shows some thickening on the valve and has no positive blood cultures. Fever is the only other criteria. Therefore, no indication at this time for treatment for endocarditis. Fever likely secondary to abscess. Patient started spiked fevers days ago with Tmax of 102.68F on 10/31. She has been afebrile since then. Per ID recommendations, discontinued the Unasyn given gram positive organisms on gram stain of the Right food wound drainage. Leukocytosis resolved. She is afebrile.  Plan  - This is day #9 of vancomycin  - ID following, appreciate recommendations.  - Initial blood cx with NGTD. Will repeat blood cultures if she spikes a fever again.  - Tissue culture with MRSA  - Will continue to monitor WBC.   Uncontrolled Diabetes: The patient's home medications for diabetes include glimepiride and metformin. She also reports that she is been off medication for a long time due to cost. HbA1C is 15.6%.  Plan  -Insulin therapy with 14IU of 70/30 Novolog bid (increased from 10 units) in addition to meal coverage insulin. This will be a more affordable option compared to Lantus.  - Will increase AM Novolog to 17 units upon discharge. Pt will be discharged with home RN for further diabetes/insulin teaching.  - [name] is assisting setting her up with IM clinic  - She will be set up with internal medicine clinic for outpatient follow up.  - SW actively working on assisting the patient with her insulin, other medications, and financial hardship (no utilities, no rent payment)   Hypertension: Patient's home medications include hydrochlorothiazide. On admission her blood pressure was 110/86. She reports compliance with this medication.  Plan  -Will continue with home medications.    Dispo. Patient might go home today if SW is able to secure pastoral help with pt's rent/utilities. Case manager actively involved, paper copy of prescription of vancomycin to be placed in pt's shadow chart.    LOS: 8 days   Ky Barban 09/29/2012, 8:06 AM

## 2012-09-29 NOTE — Progress Notes (Signed)
Utilization review complete 

## 2012-09-29 NOTE — Progress Notes (Signed)
Physical Therapy Treatment Patient Details Name: Linda Miller MRN: [id number] DOB: 1956-08-30 Today's Date: 09/29/2012 Time: 1214-1222 PT Time Calculation (min): 8 min  PT Assessment / Plan / Recommendation Comments on Treatment Session  Pt s/p I&D of rt heel.  Notified ortho tech of order for post-op shoe. Pt very agile and moving well.  Pt not open to any suggestions and wants to do it herself.    Follow Up Recommendations  Home health PT     Does the patient have the potential to tolerate intense rehabilitation     Barriers to Discharge        Equipment Recommendations  Rolling walker with 5" wheels    Recommendations for Other Services    Frequency Min 3X/week   Plan Discharge plan remains appropriate;Frequency remains appropriate    Precautions / Restrictions Restrictions RLE Weight Bearing: Non weight bearing   Pertinent Vitals/Pain N/A    Mobility  Bed Mobility Supine to Sit: 7: Independent Sit to Supine: 7: Independent Transfers Sit to Stand: 6: Modified independent (Device/Increase time);With upper extremity assist;From bed Stand to Sit: 6: Modified independent (Device/Increase time);With upper extremity assist;To bed Details for Transfer Assistance: Maintained NWB on rt leg Ambulation/Gait Ambulation/Gait Assistance: 6: Modified independent (Device/Increase time) Ambulation Distance (Feet): 25 Feet Assistive device: Rolling walker Ambulation/Gait Assistance Details: Pt maintains NWB with rt leg. Gait Pattern: Step-to pattern    Exercises     PT Diagnosis:    PT Problem List:   PT Treatment Interventions:     PT Goals Acute Rehab PT Goals PT Goal: Sit to Stand - Progress: Met PT Goal: Stand to Sit - Progress: Met PT Goal: Ambulate - Progress: Progressing toward goal PT Goal: Up/Down Stairs - Progress: Progressing toward goal  Visit Information  Last PT Received On: 09/29/12 Assistance Needed: +1    Subjective Data  Subjective: "Let me do it  myself," pt stated.   Cognition  Overall Cognitive Status: Appears within functional limits for tasks assessed/performed Arousal/Alertness: Awake/alert Orientation Level: Appears intact for tasks assessed Behavior During Session: [facility] for tasks performed Cognition - Other Comments: Pt not willing to listen to suggestions such as letting me push the IV pole so she could just worry about the walker.  Pt wanted to use the walker and push the IV pole herself.    Balance  Static Standing Balance Static Standing - Balance Support: Left upper extremity supported;During functional activity Static Standing - Level of Assistance: 6: Modified independent (Device/Increase time)  End of Session PT - End of Session Activity Tolerance: Patient tolerated treatment well Patient left: in bed;with call bell/phone within reach Nurse Communication: Mobility status;Other (comment) (Pt without post op shoe. Let nsg know I talked to Ortho tech)   GP     Linda Miller 09/29/2012, 1:15 PM  [facility] PT [phone]

## 2012-09-30 DIAGNOSIS — M869 Osteomyelitis, unspecified: Secondary | ICD-10-CM

## 2012-09-30 DIAGNOSIS — M86171 Other acute osteomyelitis, right ankle and foot: Secondary | ICD-10-CM | POA: Insufficient documentation

## 2012-09-30 LAB — CBC
HCT: 31.5 % — ABNORMAL LOW (ref 36.0–46.0)
Hemoglobin: 10.2 g/dL — ABNORMAL LOW (ref 12.0–15.0)
MCV: 77.8 fL — ABNORMAL LOW (ref 78.0–100.0)
RBC: 4.05 MIL/uL (ref 3.87–5.11)
WBC: 12.2 10*3/uL — ABNORMAL HIGH (ref 4.0–10.5)

## 2012-09-30 LAB — CULTURE, BLOOD (ROUTINE X 2)

## 2012-09-30 LAB — BASIC METABOLIC PANEL
BUN: 5 mg/dL — ABNORMAL LOW (ref 6–23)
CO2: 27 mEq/L (ref 19–32)
Chloride: 103 mEq/L (ref 96–112)
Creatinine, Ser: 0.54 mg/dL (ref 0.50–1.10)
GFR calc Af Amer: >90 mL/min (ref >90)
Glucose, Bld: 247 mg/dL — ABNORMAL HIGH (ref 70–99)
Potassium: 3.8 mEq/L (ref 3.5–5.1)

## 2012-09-30 LAB — GLUCOSE, CAPILLARY: Glucose-Capillary: 251 mg/dL — ABNORMAL HIGH (ref 70–99)

## 2012-09-30 MED ORDER — INSULIN NPH (HUMAN) (ISOPHANE) 100 UNIT/ML ~~LOC~~ SUSP: SUBCUTANEOUS | Status: DC

## 2012-09-30 MED ORDER — HEPARIN SOD (PORK) LOCK FLUSH 100 UNIT/ML IV SOLN
250.0000 [IU] | INTRAVENOUS | Status: AC | PRN
  Administered 2012-09-30: 250 [IU]

## 2012-09-30 NOTE — Progress Notes (Signed)
Inpatient Diabetes Program Recommendations  AACE/ADA: New Consensus Statement on Inpatient Glycemic Control (2013)  Target Ranges:  Prepandial:   less than 140 mg/dL      Peak postprandial:   less than 180 mg/dL (1-2 hours)      Critically ill patients:  140 - 180 mg/dL   Reason for Visit: Hyperglycemia  Inpatient Diabetes Program Recommendations Insulin - Basal: Would recommend an increase in 70/30 in the am to start to 17 units; probable need for 17 units ac supper as well.  If being discharged, can increase at OP visits. Insulin instruciton complete per bedside RN HgbA1C: [phone]  Note: Thank you, Lenor Coffin, RN, CNS, Diabetes Coordinator [phone])

## 2012-09-30 NOTE — Progress Notes (Signed)
Subjective: Her foot still hurts but she feels better overall and is ready to go home today. Unfortunately, however, her utility bill cannot be paid today but will be paid tomorrow.  Objective: Vital signs in last 24 hours: Filed Vitals:   09/29/12 0451 09/29/12 2045 09/30/12 0440 09/30/12 1435  BP: 122/74 120/78 136/91 114/71  Pulse: 85 78 83 83  Temp: 98.1 F (36.7 C) 98.4 F (36.9 C) 98.2 F (36.8 C) 98.7 F (37.1 C)  TempSrc: Oral Oral Oral Oral  Resp: 18 20 20 20   Height:      Weight:      SpO2: 100% 100% 99% 98%   Weight change:   Intake/Output Summary (Last 24 hours) at 09/30/12 1621 Last data filed at 09/30/12 1300  Gross per 24 hour  Intake 2934.33 ml  Output      0 ml  Net 2934.33 ml   Vitals reviewed.  General: sitting in bed, calm, in NAD  HEENT: no scleral icterus  Cardiac: RRR, no rubs, or gallops.  Pulm: clear to auscultation bilaterally, no wheezes, rales, or rhonchi  Abd: soft, nontender, nondistended, BS present  Ext: Left arm with no erythema, a small "knot" at the site of the Tdap vaccine, much improved. Right foot covered with bandage that is dry, clean, and intact, not as tender to palpation. ROM limited 2/2 pain but improved from yesterday. Foot is warm with good pedal pulse.  Neuro: alert and oriented X3, cranial nerves II-XII grossly intact, sensation to light touch equal in bilateral upper and lower extremities  Lab Results: Basic Metabolic Panel:  Lab 09/30/12 4132 09/29/12 0800  NA 137 134*  K 3.8 3.7  CL 103 99  CO2 27 28  GLUCOSE 247* 409*  BUN 5* 5*  CREATININE 0.54 0.48*  CALCIUM 8.8 8.8  MG -- --  PHOS -- --   CBC:  Lab 09/30/12 0430 09/29/12 0800  WBC 12.2* 9.7  NEUTROABS -- --  HGB 10.2* 10.3*  HCT 31.5* 32.1*  MCV 77.8* 78.1  PLT 498* 513*   CBG:  Lab 09/30/12 0753 09/29/12 2218 09/29/12 1717 09/29/12 1223 09/29/12 0824 09/28/12 2151  GLUCAP 251* 307* 215* 141* 252* 278*   Urinalysis:  Lab 09/29/12 1635    COLORURINE YELLOW  LABSPEC 1.014  PHURINE 6.5  GLUCOSEU 250*  HGBUR NEGATIVE  BILIRUBINUR NEGATIVE  KETONESUR NEGATIVE  PROTEINUR NEGATIVE  UROBILINOGEN 0.2  NITRITE NEGATIVE  LEUKOCYTESUR NEGATIVE    Micro Results: Recent Results (from the past 240 hour(s))  CULTURE, BLOOD (ROUTINE X 2)     Status: Normal   Collection Time   09/22/12  7:00 AM      Component Value Range Status Comment   Specimen Description BLOOD LEFT ARM   Final    Special Requests BOTTLES DRAWN AEROBIC AND ANAEROBIC 10CC   Final    Culture  Setup Time 09/22/2012 13:44   Final    Culture NO GROWTH 5 DAYS   Final    Report Status 09/28/2012 FINAL   Final   CULTURE, BLOOD (ROUTINE X 2)     Status: Normal   Collection Time   09/22/12  7:05 AM      Component Value Range Status Comment   Specimen Description BLOOD LEFT ARM   Final    Special Requests BOTTLES DRAWN AEROBIC AND ANAEROBIC 10CC   Final    Culture  Setup Time 09/22/2012 13:44   Final    Culture NO GROWTH 5 DAYS   Final  Report Status 09/28/2012 FINAL   Final   URINE CULTURE     Status: Normal   Collection Time   09/22/12  2:07 PM      Component Value Range Status Comment   Specimen Description URINE, CLEAN CATCH   Final    Special Requests NONE   Final    Culture  Setup Time 09/22/2012 16:04   Final    Colony Count >=100,000 COLONIES/ML   Final    Culture ESCHERICHIA COLI   Final    Report Status 09/24/2012 FINAL   Final    Organism ID, Bacteria ESCHERICHIA COLI   Final   MRSA PCR SCREENING     Status: Normal   Collection Time   09/22/12  5:20 PM      Component Value Range Status Comment   MRSA by PCR NEGATIVE  NEGATIVE Final   SURGICAL PCR SCREEN     Status: Abnormal   Collection Time   09/23/12  2:00 AM      Component Value Range Status Comment   MRSA, PCR POSITIVE (*) NEGATIVE Final    Staphylococcus aureus POSITIVE (*) NEGATIVE Final   CULTURE, ROUTINE-ABSCESS     Status: Normal   Collection Time   09/23/12  8:45 AM       Component Value Range Status Comment   Specimen Description ABSCESS RIGHT FOOT   Final    Special Requests RIGHT HEEL PT ON ZOSYN AND VANCO   Final    Gram Stain     Final    Value: NO WBC SEEN     RARE SQUAMOUS EPITHELIAL CELLS PRESENT     RARE GRAM POSITIVE COCCI IN PAIRS   Culture     Final    Value: MODERATE METHICILLIN RESISTANT STAPHYLOCOCCUS AUREUS     Note: RIFAMPIN AND GENTAMICIN SHOULD NOT BE USED AS SINGLE DRUGS FOR TREATMENT OF STAPH INFECTIONS.     Note: CRITICAL RESULT CALLED TO, READ BACK BY AND VERIFIED WITH: ASHLEY MINOR @ 10:05AM  09/26/12 BY DWEEKS   Report Status 09/26/2012 FINAL   Final    Organism ID, Bacteria METHICILLIN RESISTANT STAPHYLOCOCCUS AUREUS   Final   ANAEROBIC CULTURE     Status: Normal   Collection Time   09/23/12  8:45 AM      Component Value Range Status Comment   Specimen Description ABSCESS RIGHT FOOT   Final    Special Requests RIGHT HEEL PT ON ZOSYN AND VANCO   Final    Gram Stain     Final    Value: NO WBC SEEN     RARE SQUAMOUS EPITHELIAL CELLS PRESENT     RARE GRAM POSITIVE COCCI     IN PAIRS   Culture NO ANAEROBES ISOLATED   Final    Report Status 09/28/2012 FINAL   Final   CULTURE, BLOOD (ROUTINE X 2)     Status: Normal   Collection Time   09/24/12 12:40 PM      Component Value Range Status Comment   Specimen Description BLOOD LEFT ARM   Final    Special Requests BOTTLES DRAWN AEROBIC AND ANAEROBIC 10CC   Final    Culture  Setup Time 09/24/2012 16:59   Final    Culture NO GROWTH 5 DAYS   Final    Report Status 09/30/2012 FINAL   Final   CULTURE, BLOOD (ROUTINE X 2)     Status: Normal   Collection Time   09/24/12 12:50 PM  Component Value Range Status Comment   Specimen Description BLOOD LEFT HAND   Final    Special Requests BOTTLES DRAWN AEROBIC ONLY 5CC   Final    Culture  Setup Time 09/24/2012 16:59   Final    Culture NO GROWTH 5 DAYS   Final    Report Status 09/30/2012 FINAL   Final   CULTURE, BLOOD (ROUTINE X 2)      Status: Normal (Preliminary result)   Collection Time   09/25/12 12:35 PM      Component Value Range Status Comment   Specimen Description BLOOD RIGHT ARM   Final    Special Requests BOTTLES DRAWN AEROBIC AND ANAEROBIC 10CC   Final    Culture  Setup Time 09/25/2012 17:08   Final    Culture     Final    Value:        BLOOD CULTURE RECEIVED NO GROWTH TO DATE CULTURE WILL BE HELD FOR 5 DAYS BEFORE ISSUING A FINAL NEGATIVE REPORT   Report Status PENDING   Incomplete   CULTURE, BLOOD (ROUTINE X 2)     Status: Normal (Preliminary result)   Collection Time   09/25/12 12:42 PM      Component Value Range Status Comment   Specimen Description BLOOD RIGHT ARM   Final    Special Requests BOTTLES DRAWN AEROBIC AND ANAEROBIC 10CC   Final    Culture  Setup Time 09/25/2012 17:08   Final    Culture     Final    Value:        BLOOD CULTURE RECEIVED NO GROWTH TO DATE CULTURE WILL BE HELD FOR 5 DAYS BEFORE ISSUING A FINAL NEGATIVE REPORT   Report Status PENDING   Incomplete    Studies/Results: No results found. Medications: I have reviewed the patient's current medications. Scheduled Meds:   . enoxaparin (LOVENOX) injection  40 mg Subcutaneous Q24H  . hydrochlorothiazide  25 mg Oral Q breakfast  . insulin aspart  0-9 Units Subcutaneous TID WC  . insulin aspart protamine-insulin aspart  14 Units Subcutaneous BID WC  . nicotine  14 mg Transdermal Daily  . vancomycin  1,000 mg Intravenous Q8H   Continuous Infusions:   . sodium chloride 100 mL/hr at 09/30/12 0600   PRN Meds:.acetaminophen, acetaminophen, alum & mag hydroxide-simeth, diphenhydrAMINE, HYDROmorphone (DILAUDID) injection, ondansetron (ZOFRAN) IV, ondansetron, oxyCODONE-acetaminophen, sodium chloride, [DISCONTINUED]  HYDROmorphone (DILAUDID) injection Assessment/Plan: [name] is a 56yo female who presented for R foot pain, other purulent infections recently on her buttocks and labia. Her diabetes is not well controlled, and she has a  previously undiagnosed 1/6 systolic heart murmur of unclear etiology.  Right foot cellulitis and Left Buttock abcess. Status post irrigation and debridement excision of the foot on 10/30. A partial calcaneal excision was also performed. Abscess gram stain reports gram positive but cultures that grew MRSA. In this patient with uncontrolled, type 2 diabetes, and history of recurrent skin infections with the most vaginal abscess that required I&D, there is a possibility that she has disseminated infection that is seeding into the foot hematogenously. She has responded to the IV Vanco treatment well. Her leukocytosis resolved. No fever for >3 days now.  Plan  - ID following, appreciate recommendations for antibiotic treatment duration  - IV placed PICC line yesterday for long term IV Abx (4 weeks post-op).  - Case manager consulted, pt to go home with [facility] RN for vanc administration, pharmacy protocol.  - Orthopedics following. Appreciate recommendations. Pt to have  no weight bearing on foot for three weeks, with daily wound dressing changes.   - Continue with wound care recommendations per ortho.  - she has had PT -who have recommended HH PT and Rolling Walker with 5 wheels  - Left buttock wound did not yield pus on aspiration using a 18G needle. No surgical intervention was recommended by surgery.  - Continue Vanc.  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 There are no other stigmata of infective endocarditis. Transthoracic echocardiogram revealed a possible small vegetation, and that tip of mitral valve. She did not quite meet the criteria for a diagnosis of IE - only one 1 major(ECHO) and 1 minor Duke (fever) criteria for IE. Her TEE only shows some thickening on the valve and has no positive blood cultures. Fever is the only other criteria.  Therefore, no indication at this time for treatment for endocarditis. Fever likely secondary to abscess. Patient started spiked fevers days ago with Tmax of 102.62F on 10/31. She has been afebrile since then. Per ID recommendations, discontinued the Unasyn given gram positive organisms on gram stain of the Right food wound drainage. Leukocytosis initially resolved, mild leukocytosis today at 12.2. She is afebrile.  Plan  - This is day #10 of vancomycin  - ID following, appreciate recommendations.  - Initial blood cx with NGTD. Will repeat blood cultures if she spikes a fever again.  - Tissue culture with MRSA  - Will continue to monitor WBC.   Uncontrolled Diabetes: The patient's home medications for diabetes include glimepiride and metformin. She also reports that she is been off medication for a long time due to cost. HbA1C is 15.6%.  Plan  -Insulin therapy with 14IU of 70/30 Novolog bid (increased from 10 units) in addition to meal coverage insulin. This will be a more affordable option compared to Lantus.  - Will increase AM Novolog to 17 units upon discharge. Pt will be discharged with home RN for further diabetes/insulin teaching. She might need 17 units BID as outpatient depending on her eating.  - [name] is assisting setting her up with IM clinic  - She will be set up with internal medicine clinic for outpatient follow up.  - SW actively working on assisting the patient with her insulin, other medications, and financial hardship (no utilities, no rent payment)   Hypertension: Patient's home medications include hydrochlorothiazide. On admission her blood pressure was 110/86. She reports compliance with this medication.  Plan  -Will continue with home medications.   Dispo. Patient might go home today.  Case manager actively involved, paper copy of prescription of vancomycin to be placed in pt's shadow chart, insulin prescription given, case manager to give pt insulin. SW to help with  transportation. Pastoral care to help with utility. Pt will be discharge today, she will spend the night at her mom's house and reports that she will be able to have her utilities reinstated tomorrow. She reports that she will receive a pay check tomorrow and will be able to pay for her metformin.      LOS: 9 days   Ky Barban 09/30/2012, 4:21 PM

## 2012-09-30 NOTE — Progress Notes (Signed)
Pt education complete. Verbalized understanding of all information presented.  Pt transported down in wheelchair with all supplies to continue care. Pt returned home with friend.

## 2012-10-01 ENCOUNTER — Telehealth: Payer: Self-pay | Admitting: Dietician

## 2012-10-01 LAB — CULTURE, BLOOD (ROUTINE X 2): Culture: NO GROWTH

## 2012-10-05 ENCOUNTER — Telehealth: Payer: Self-pay | Admitting: *Deleted

## 2012-10-05 ENCOUNTER — Encounter (HOSPITAL_COMMUNITY): Payer: Self-pay | Admitting: *Deleted

## 2012-10-05 ENCOUNTER — Emergency Department (HOSPITAL_COMMUNITY)
Admission: EM | Admit: 2012-10-05 | Discharge: 2012-10-05 | Disposition: A | Discharge status: Home / Self Care | Payer: Medicaid Other | Attending: Emergency Medicine | Admitting: Emergency Medicine

## 2012-10-05 DIAGNOSIS — Z79899 Other long term (current) drug therapy: Secondary | ICD-10-CM | POA: Insufficient documentation

## 2012-10-05 DIAGNOSIS — M25579 Pain in unspecified ankle and joints of unspecified foot: Secondary | ICD-10-CM | POA: Insufficient documentation

## 2012-10-05 DIAGNOSIS — E1169 Type 2 diabetes mellitus with other specified complication: Secondary | ICD-10-CM | POA: Insufficient documentation

## 2012-10-05 DIAGNOSIS — I10 Essential (primary) hypertension: Secondary | ICD-10-CM | POA: Insufficient documentation

## 2012-10-05 DIAGNOSIS — M79673 Pain in unspecified foot: Secondary | ICD-10-CM

## 2012-10-05 DIAGNOSIS — R739 Hyperglycemia, unspecified: Secondary | ICD-10-CM

## 2012-10-05 DIAGNOSIS — Z5189 Encounter for other specified aftercare: Secondary | ICD-10-CM

## 2012-10-05 DIAGNOSIS — F17200 Nicotine dependence, unspecified, uncomplicated: Secondary | ICD-10-CM | POA: Insufficient documentation

## 2012-10-05 DIAGNOSIS — Z794 Long term (current) use of insulin: Secondary | ICD-10-CM | POA: Insufficient documentation

## 2012-10-05 DIAGNOSIS — Z4801 Encounter for change or removal of surgical wound dressing: Secondary | ICD-10-CM | POA: Insufficient documentation

## 2012-10-05 DIAGNOSIS — E7800 Pure hypercholesterolemia, unspecified: Secondary | ICD-10-CM | POA: Insufficient documentation

## 2012-10-05 DIAGNOSIS — Z9889 Other specified postprocedural states: Secondary | ICD-10-CM | POA: Insufficient documentation

## 2012-10-05 LAB — CBC WITH DIFFERENTIAL/PLATELET
Basophils Absolute: 0.1 10*3/uL (ref 0.0–0.1)
Eosinophils Absolute: 0.6 10*3/uL (ref 0.0–0.7)
Eosinophils Relative: 6 % — ABNORMAL HIGH (ref 0–5)
Lymphocytes Relative: 24 % (ref 12–46)
Lymphs Abs: 2.4 10*3/uL (ref 0.7–4.0)
MCV: 78.6 fL (ref 78.0–100.0)
Neutrophils Relative %: 59 % (ref 43–77)
Platelets: 462 10*3/uL — ABNORMAL HIGH (ref 150–400)
RBC: 4.26 MIL/uL (ref 3.87–5.11)
RDW: 14.7 % (ref 11.5–15.5)
WBC: 9.9 10*3/uL (ref 4.0–10.5)

## 2012-10-05 LAB — BASIC METABOLIC PANEL
CO2: 29 mEq/L (ref 19–32)
Calcium: 9.2 mg/dL (ref 8.4–10.5)
GFR calc non Af Amer: 59 mL/min — ABNORMAL LOW (ref >90)
Potassium: 4 mEq/L (ref 3.5–5.1)
Sodium: 137 mEq/L (ref 135–145)

## 2012-10-05 MED ORDER — OXYCODONE-ACETAMINOPHEN 5-325 MG PO TABS
2.0000 | ORAL_TABLET | Freq: Once | ORAL | Status: AC
  Administered 2012-10-05: 2 via ORAL
  Filled 2012-10-05: qty 2

## 2012-10-05 MED ORDER — OXYCODONE-ACETAMINOPHEN 5-325 MG PO TABS: 1.0000 | ORAL_TABLET | ORAL | Status: DC | PRN

## 2012-10-05 NOTE — ED Notes (Signed)
Pt had right foot abscess drained  And now with extreme pain and draining since yesterday.  First opened last Wednesday.  Pt is diabetic.  Pick line to right arm for IV antibiotics

## 2012-10-05 NOTE — Telephone Encounter (Addendum)
Discharge date:09-30-12 Call date: 10-05-12 Hospital follow up appointment date: 10-13-12 Calling to assist with transition of care from hospital to home.  Discharge medications reviewed: yes- per patient: she has nothing for pain and cannot stand the pain, afraid to take anything for pain that she has not been told is okay to take with the antibiotics she is taking  Able to fill all prescriptions? Has been doing her antibiotics herself, her mother has been helping with antibiotics and insulin because she is in so much pain  Patient aware of hospital follow up appointments: no, in too much pain per patient  Problems with transportation: yes- but will call sister to see is she can get a ride  Other problems/concerns: in a lot pain, wants pain medicine called in. Told her I'd discuss with triage to see about getting her an appointment sooner and what she can do for her pain.

## 2012-10-05 NOTE — ED Notes (Signed)
Pt awaiting IV to check her PICC line dressing. Pt stable and ready for d/c following IV team assessment.

## 2012-10-05 NOTE — ED Provider Notes (Signed)
History     CSN: [id number]  Arrival date & time 10/05/12  1510   First MD Initiated Contact with Patient 10/05/12 1912      Chief Complaint  Patient presents with  . right foot wound     (Consider location/radiation/quality/duration/timing/severity/associated sxs/prior treatment) HPI Comments: Linda Miller is a 56 y.o. Female who is here for evaluation of the right foot pain. She had an abscess drained about 2 weeks ago, and is being treated with in home IV therapy using  Vancomycin. She does not have any pain medicine. She tried to get some from her PCP on 10/01/12. She denies fever, chills, nausea, or vomiting. She is hungry. She did not take her evening, vancomycin, or evening, insulin, yet. She is due for followup with her orthopedist, who did the foot drainage procedure, in 3 days. She has a PCP followup, next week. Her pain is worse with palpation and pressure on her right foot. She is using a walker to prevent weightbearing on the right heel. She is able to toe touch on the right in order, to ambulate. There are no other modifying factors.  The history is provided by the patient.    Past Medical History  Diagnosis Date  . Diabetes mellitus   . Hypertension   . High cholesterol     Past Surgical History  Procedure Date  . Abdominal hysterectomy   . Debridement  foot   . I&d extremity 09/23/2012    Procedure: IRRIGATION AND DEBRIDEMENT EXTREMITY;  Surgeon: Nadara Mustard, MD;  Location: [facility] OR;  Service: Orthopedics;  Laterality: Right;  . Tee without cardioversion 09/24/2012    Procedure: TRANSESOPHAGEAL ECHOCARDIOGRAM (TEE);  Surgeon: Laurey Morale, MD;  Location: [facility] ENDOSCOPY;  Service: Cardiovascular;  Laterality: N/A;    No family history on file.  History  Substance Use Topics  . Smoking status: Current Every Day Smoker  . Smokeless tobacco: Not on file  . Alcohol Use: Yes    OB History    Grav Para Term Preterm Abortions TAB SAB Ect Mult Living           Review of Systems  All other systems reviewed and are negative.    Allergies  Codeine and Tramadol  Home Medications   Current Outpatient Rx  Name  Route  Sig  Dispense  Refill  . GLIMEPIRIDE 4 MG PO TABS   Oral   Take 4 mg by mouth daily before breakfast.         . HYDROCHLOROTHIAZIDE 25 MG PO TABS   Oral   Take 25 mg by mouth daily.         . INSULIN ISOPHANE HUMAN 100 UNIT/ML Kathryn SUSP   Subcutaneous   Inject 14-17 Units into the skin 2 (two) times daily. Inject 17 units into the skin in the morning and 14 units into the skin in the afternoon. (preferably morning and afternoon dose to be 12 hours apart)         . METFORMIN HCL 1000 MG PO TABS   Oral   Take 1 tablet (1,000 mg total) by mouth 2 (two) times daily.   30 tablet   0   . VANCOMYCIN HCL IN DEXTROSE 1 GM/200ML IV SOLN   Intravenous   Inject 200 mLs (1,000 mg total) into the vein every 8 (eight) hours.   4200 mL   2     Please give per pharmacy protocol.   . OXYCODONE-ACETAMINOPHEN 5-325 MG PO TABS  Oral   Take 1 tablet by mouth every 4 (four) hours as needed for pain.   20 tablet   0     BP 126/59  Pulse 75  Temp 98.5 F (36.9 C) (Oral)  Resp 16  SpO2 100%  Physical Exam  Nursing note and vitals reviewed. Constitutional: She is oriented to person, place, and time. She appears well-developed and well-nourished. She appears distressed (uuncomfortable).  HENT:  Head: Normocephalic and atraumatic.  Eyes: Conjunctivae normal and EOM are normal. Pupils are equal, round, and reactive to light.  Neck: Normal range of motion and phonation normal. Neck supple.  Cardiovascular: Normal rate.   Pulmonary/Chest: Effort normal.  Musculoskeletal: She exhibits no edema.       Wound on right heel is dressed, the dressing was removed for evaluation of the wound. There is a healing wound of the plantar aspect of the foot, extending from the heel to the mid foot. The more distal aspect of the  wound had been left open and has retention sutures in place over the wound edges. There is a 1 cm, area centrally that appears to be open and is draining a minimal amount of serous fluid. There is no frank associated fluctuance, redness, or purulent drainage. There is no proximal streaking.  Neurological: She is alert and oriented to person, place, and time. She has normal strength. She exhibits normal muscle tone.  Skin: Skin is warm and dry.  Psychiatric: She has a normal mood and affect. Her behavior is normal. Judgment and thought content normal.    ED Course  Procedures (including critical care time)  Labs Reviewed  CBC WITH DIFFERENTIAL - Abnormal; Notable for the following:    Hemoglobin 10.8 (*)     HCT 33.5 (*)     MCH 25.4 (*)     Platelets 462 (*)     Eosinophils Relative 6 (*)     All other components within normal limits  BASIC METABOLIC PANEL - Abnormal; Notable for the following:    Glucose, Bld 289 (*)     GFR calc non Af Amer 59 (*)     GFR calc Af Amer 68 (*)     All other components within normal limits   No results found.   1. Foot pain   2. Wound check, abscess   3. Hyperglycemia       MDM  Healing, postoperative wound, right foot. Mild, hyperglycemia, without evidence ketosis. Doubt sepsis, ascending, infection, or osteomyelitis. The patient is stable for discharge with outpatient management.    Plan: Home Medications- Percocet; Home Treatments- rest, and fluids; Recommended follow up- orthopedics in 3 days as scheduled. PCP in one week for blood sugar. Check     Flint Melter, MD 10/05/12 2147

## 2012-10-05 NOTE — Telephone Encounter (Signed)
Called pt per request from donnaP., pt states her pain is much greater than when she was in pt. Also states PICC site is red, swollen and painful, she sttaes she is getting worse. She is ask to go to ED now for evaluation of pain and PICC, she says "what am i suppose to do about those people coming out here" i ask her when they were coming and she stated that they were supposed to be there this am at 0800. She was asked to call 911 or go to ED and not worry about the people coming for visits. She hung up without a response

## 2012-10-05 NOTE — Telephone Encounter (Signed)
It seem from the description that patient has a possible PICC line infection. In this situation, it will be best for her to be evaluated by one of the clinic doctors as soon as possible. In case we do not have openings today, we should send her to ER.

## 2012-10-06 NOTE — Telephone Encounter (Signed)
Talked with pt - has appt 10/08/12 with ortho MD and appt 10/13/12 with [facility] - offered to resch appt - to keep sch appt. Has Rx for pain med - ask for assist. Suggest to talk with Rudell Cobb for [employer] Special card. Infor mailed to pt along with MAP program. Suggest [facility] or [facility] for meds due to cost. Encourage to also check with other pharmacies.

## 2012-10-10 NOTE — Discharge Summary (Signed)
Internal Medicine Teaching [facility] Discharge Note  Name: Linda Miller MRN: [id number] DOB: December 15, 1955 56 y.o.  Date of Admission: 09/21/12, 5:19PM Date of Discharge: 09/30/12 Attending Physician: Dr. Criselda Peaches  Discharge Diagnosis: Cellulitis of right foot DM2 Hypertension *Osteomyelitis of right foot   Discharge Medications:   Medication List     As of 10/10/2012  9:45 PM    TAKE these medications         oxyCODONE-acetaminophen 5-325 MG per tablet   Commonly known as: PERCOCET/ROXICET   Take 1 tablet by mouth every 4 (four) hours as needed for pain.      ASK your doctor about these medications         glimepiride 4 MG tablet   Commonly known as: AMARYL   Take 4 mg by mouth daily before breakfast.      hydrochlorothiazide 25 MG tablet   Commonly known as: HYDRODIURIL   Take 25 mg by mouth daily.      insulin NPH 100 UNIT/ML injection   Commonly known as: HUMULIN N,NOVOLIN N   Inject 14-17 Units into the skin 2 (two) times daily. Inject 17 units into the skin in the morning and 14 units into the skin in the afternoon. (preferably morning and afternoon dose to be 12 hours apart)      metFORMIN 1000 MG tablet   Commonly known as: GLUCOPHAGE   Take 1 tablet (1,000 mg total) by mouth 2 (two) times daily.      vancomycin 1 GM/200ML Soln   Commonly known as: VANCOCIN   Inject 200 mLs (1,000 mg total) into the vein every 8 (eight) hours.        Disposition and follow-up:   Linda Miller was discharged from [facility] in stable condition.  At the hospital follow up visit please address her compliance to her insulin therapy, her BP medications needs, and ongoing treatment for osteomyelitis, including management of her PICC line and wound care. Would repeat CBC for WBC trending and BMET for creatinine trending. Linda. Blume has had financial hardships, needing assistance for pastoral care to maintain her rent and utilities and she will  benefit from meeting with CSW, Lynnae January. She also would benefit from meeting with Pincus Badder for further diabetes education. She has follow up appointments with Dr. Lajoyce Corners, she is non weight bearing in her right foot and will need continued PT as she recovers. She also has follow up appointment with Dr. Drue Second, in ID.  Follow-up Appointments:     Follow-up Information    Follow up with DUDA,MARCUS V, MD. In 3 days. (As scheduled)    Contact information:   [address] [place] [address] [place] [address] [phone]         Discharge Orders    Future Appointments: Provider: Department: Dept Phone: Center:   10/13/2012 9:00 AM Sunday Spillers, MD [facility] [phone] [facility]   10/29/2012 9:00 AM Judyann Munson, MD [facility] [phone] [facility]      Consultations:  Ortopedic Surgery: Dr. Lajoyce Corners ID: Dr. Drue Second and Dr. Luciana Axe    Procedures Performed:  [place] Extrem Low Right Ltd  09/21/2012  *RADIOLOGY REPORT*  Clinical Data: Heel pain after stepping on a rock.  Swelling.  ULTRASOUND RIGHT LOWER EXTREMITY LIMITED  Technique:  Ultrasound examination of the region of interest in the right lower extremity was performed.  Comparison:  None.  Findings: The area of injury is on the plantar aspect  of the forehead (heel). No echogenic foreign body is seen.  There is no visible abscess.  Sonographic interrogation extended upward to the medial ankle where there is no discrete fluid collection.  IMPRESSION: No discrete abscess or focal fluid collection is seen.  Diffuse soft tissue swelling is noted.   Original Report Authenticated By: Elsie Stain, M.D.    Dg Foot Complete Right  09/16/2012  *RADIOLOGY REPORT*  Clinical Data: Heel pain and swelling, slipped on something in driveway  RIGHT FOOT COMPLETE - 3+ VIEW  Comparison: None  Findings: Bones are questionably demineralized. Joint spaces preserved. Small plantar calcaneal spur. No acute fracture,  dislocation or bone destruction.  IMPRESSION: Calcaneal spurring. No acute abnormalities.   Original Report Authenticated By: Lollie Marrow, M.D.     2D Echo: 09/22/12 Study Conclusions  - Left ventricle: The cavity size was normal. Wall thickness was normal. Systolic function was normal. The estimated ejection fraction was in the range of 55% to 60%. Wall motion was normal; there were no regional wall motion abnormalities. - Mitral valve: Moderate regurgitation directed eccentrically and posteriorly. Cannot exclude a small vegetation attached to the tip of the anterior mitral leaflet. Impressions:  - Consider TEE to better evaluate possible mitral valve vegetation.  TEE: 09/24/12 Study Conclusions  - Left ventricle: The cavity size was normal. Wall thickness was normal. Systolic function was normal. The estimated ejection fraction was in the range of 55% to 60%. Wall motion was normal; there were no regional wall motion abnormalities. - Aortic valve: There was no stenosis. - Mitral valve: There is nodular thickening at the tip of the anterior leaflet of the mitral valve.In the right clinical context, this would be concerning for endocarditis. Mild to moderate regurgitation, posterioly-directed jet. - Left atrium: The atrium was mildly dilated. No evidence of thrombus in the atrial cavity or appendage. - Right ventricle: The cavity size was normal. Systolic function was normal. - Right atrium: No evidence of thrombus in the atrial cavity or appendage. Impressions:  - There was a small area of nodular thickening at the tip of the anterior mitral valve leaflet. There was mild to moderate mitral regurgitation with a posteriorly-directed jet. In the right clinical context, the MV finding could represent endocarditis.   Admission HPI:  Linda Miller is 56 year old woman with past medical history of diabetes and hypertension and hyperlipidemia, who presents with history of  pain in the right foot for the last 5 days. The patient reports that she stepped on a rock about 7 days ago. The following day, she started experiencing pain in the right foot. She presented to the ED, and she was evaluated and prescribed antibiotics, and pain medications, which she did not refill due cost. The pain has progressively worsened and she reports experiencing some history of fevers and chills. The pain is constant, and 10 out of 10. There is a history of swelling of the right. She has progressively found it difficult to walk because of pain. She also reports nausea, but no history of vomiting. This is the first, time she's experiencing foot infection.  Recently, she's been without any medical care after [facility] was closed where she was seeing her regular doctor. She is currently taking metronidazole for vagina infection that was diagnosed on October 17. She reports compliance with her usual medications for diabetes, and hypertension. Her blood pressure is usually around 130/60. Her blood sugars are within the ranges of 106 at 180. She denies any history of heart  problems or stroke.  She is currently separated and she does not have any children. She works in [employer]. She does not have health insurance at the moment.  She reports smoking about one cigarette per day. She does not take alcohol or use of any illegal drugs.      Hospital Course by problem list: Right foot cellulitis and Left Buttock abcess. She was promptly treated with IV antibiotics for broad coverage. She had irrigation and debridement of the right foot wound on 10/30. A partial calcaneal excision was also performed. Abscess cultures grew MRSA and her antibiotic therapy was narrowed to IV Vancomycin. Her left buttock wound did not yield pus on aspiration using a 18G needle and no surgical intervention was recommended by surgical specialist. The left buttock abscess remained stable, with no increased tenderness, erythema,  or edema throughout her hospitalization. Her right foot pain was well controlled initially with IV Dilaudid and later with PO pain medications alone (Percocet). Given her uncontrolled type 2 diabetes, and history of recurrent skin infections including a vaginal abscess that required I&D, there was a possibility that she had disseminated infection that had seeded into the foot hematogenously. Infectious Disease specialists were consulted for possible seeding of the infection to other sites, including her mitral valve (posisbly causing infective endocarditis) as described below. She responded to IV Vancomycin well, however, and her leukocytosis and fever resolved days prior to her discharge.  She had a PICC line placed for continued antibiotic coverage (total of 4 weeks post-op per ID). She will be non-weight bearing on her right foot for three weeks after her discharge and will require daily wound dressing changes. While in the hospital, she received wound dressing change instructions and insisted on changing her wound dressings herself; she will continue to changes her dressings at home. She will be discharged with [facility] PT, RN for ongoing vancomycin therapy, and SW for financial and medication assistance. She has follow up appointments with Dr. Lajoyce Corners in Orthopedic Surgery, Dr. Drue Second in Infectious Disease, and Dr. Allena Katz at the IM clinic.   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 There were no other stigmata of infective endocarditis. Transthoracic echocardiogram revealed a possible small vegetation, at the tip of the mitral valve. She did not quite meet the criteria for a diagnosis of IE - only 1 major(ECHO) and 1 minor Duke (fever) criteria for IE. Her TEE only showed some thickening on the valve and she had no positive blood cultures. Her fever was the only other criteria. Therefore, there was no indication at that time for treatment for  endocarditis. Her fever subsided days prior to her discharge and was likely secondary to her abscess. Her last fever had a Tmax of 102.52F on 10/31. Per ID recommendations, we discontinued Unasyn given gram positive organisms on gram stain of the Right food wound drainage. Her leukocytosis persisted although it trended down. She will need repeat CBC for WBC trending at her outpatient follow up visit.   Uncontrolled Diabetes: The patient's home medications for diabetes include glimepiride and metformin. She also reports that she had been off these medication for a long time due to their cost. Her HbA1C was 15.6% during this hospitalization. She was initially started on insulin therapy with 10IU of 70/30 Novolog bid but this was  Increased to 14 units bid plus meal coverage insulin. Lantus was considered but not given to her as Novolog was found to  be a more affordable option for the outpatient management of Linda. Wisner's DM2 therapy. While in the hospital. Pincus Badder, the [facility] diabetes educator met with Linda. Choplin and gave hear few weeks of syringes and needles for continued outpatient therapy with insulin. The Case Manager gave Linda. Mastrianni Novolog insulin to last for her first month of outpatient therapy. She will be discharged with home RN for further diabetes/insulin teaching. She might need 17 units BID as outpatient depending on her eating habits.   Hypertension: Patient's home medications include hydrochlorothiazide. On admission her blood pressure was 110/86. She reports compliance with this medication. We continued this medication. She will need outpatient follow up for this problem.   Financial Hardship. Case manager assisted with insulin for one month of outpatient therapy. CSW assisted with transportation needs and coordinated pastoral care assistance for payment of Md. Branan's rent and reinstatement of her home utilities.  Upon her discharge she indicated that she would spend the night at her mom's  house and reported that she wiuld be able to have her utilities reinstated the next day. She reported that she would receive a paycheck the following day and would be able to pay for her metformin.      Discharge Vitals:    09/30/12 1435   BP:  114/71   Pulse:  83   Temp:  98.7 F (37.1 C)   TempSrc:  Oral   Resp:  20   Height:    Weight:    SpO2:  98%    Weight change:   Intake/Output Summary (Last 24 hours) at 09/30/12 1621 Last data filed at 09/30/12 1300   Gross per 24 hour   Intake  2934.33 ml   Output  0 ml   Net  2934.33 ml      Discharge Labs:  Basic Metabolic Panel:   Lab  09/30/12 0430  09/29/12 0800   NA  137  134*   K  3.8  3.7   CL  103  99   CO2  27  28   GLUCOSE  247*  409*   BUN  5*  5*   CREATININE  0.54  0.48*   CALCIUM  8.8  8.8   MG  --  --   PHOS  --  --    CBC:   Lab  09/30/12 0430  09/29/12 0800   WBC  12.2*  9.7   NEUTROABS  --  --   HGB  10.2*  10.3*   HCT  31.5*  32.1*   MCV  77.8*  78.1   PLT  498*  513*    CBG:   Lab  09/30/12 0753  09/29/12 2218  09/29/12 1717  09/29/12 1223  09/29/12 0824  09/28/12 2151   GLUCAP  251*  307*  215*  141*  252*  278*    Urinalysis:   Lab  09/29/12 1635   COLORURINE  YELLOW   LABSPEC  1.014   PHURINE  6.5   GLUCOSEU  250*   HGBUR  NEGATIVE   BILIRUBINUR  NEGATIVE   KETONESUR  NEGATIVE   PROTEINUR  NEGATIVE   UROBILINOGEN  0.2   NITRITE  NEGATIVE   LEUKOCYTESUR  NEGATIVE    Micro Results:  Recent Results (from the past 240 hour(s))   CULTURE, BLOOD (ROUTINE X 2) Status: Normal    Collection Time    09/22/12 7:00 AM   Component  Value  Range  Status  Comment  Specimen Description  BLOOD LEFT ARM   Final     Special Requests  BOTTLES DRAWN AEROBIC AND ANAEROBIC 10CC   Final     Culture Setup Time  09/22/2012 13:44   Final     Culture  NO GROWTH 5 DAYS   Final     Report Status  09/28/2012 FINAL   Final    CULTURE, BLOOD (ROUTINE X 2) Status: Normal    Collection Time     09/22/12 7:05 AM   Component  Value  Range  Status  Comment    Specimen Description  BLOOD LEFT ARM   Final     Special Requests  BOTTLES DRAWN AEROBIC AND ANAEROBIC 10CC   Final     Culture Setup Time  09/22/2012 13:44   Final     Culture  NO GROWTH 5 DAYS   Final     Report Status  09/28/2012 FINAL   Final    URINE CULTURE Status: Normal    Collection Time    09/22/12 2:07 PM   Component  Value  Range  Status  Comment    Specimen Description  URINE, CLEAN CATCH   Final     Special Requests  NONE   Final     Culture Setup Time  09/22/2012 16:04   Final     Colony Count  >=100,000 COLONIES/ML   Final     Culture  ESCHERICHIA COLI   Final     Report Status  09/24/2012 FINAL   Final     Organism ID, Bacteria  ESCHERICHIA COLI   Final    MRSA PCR SCREENING Status: Normal    Collection Time    09/22/12 5:20 PM   Component  Value  Range  Status  Comment    MRSA by PCR  NEGATIVE  NEGATIVE  Final    SURGICAL PCR SCREEN Status: Abnormal    Collection Time    09/23/12 2:00 AM   Component  Value  Range  Status  Comment    MRSA, PCR  POSITIVE (*)  NEGATIVE  Final     Staphylococcus aureus  POSITIVE (*)  NEGATIVE  Final    CULTURE, ROUTINE-ABSCESS Status: Normal    Collection Time    09/23/12 8:45 AM   Component  Value  Range  Status  Comment    Specimen Description  ABSCESS RIGHT FOOT   Final     Special Requests  RIGHT HEEL PT ON ZOSYN AND VANCO   Final     Gram Stain    Final     Value:  NO WBC SEEN     RARE SQUAMOUS EPITHELIAL CELLS PRESENT     RARE GRAM POSITIVE COCCI IN PAIRS    Culture    Final     Value:  MODERATE METHICILLIN RESISTANT STAPHYLOCOCCUS AUREUS     Note: RIFAMPIN AND GENTAMICIN SHOULD NOT BE USED AS SINGLE DRUGS FOR TREATMENT OF STAPH INFECTIONS.     Note: CRITICAL RESULT CALLED TO, READ BACK BY AND VERIFIED WITH: ASHLEY MINOR @ 10:05AM 09/26/12 BY DWEEKS    Report Status  09/26/2012 FINAL   Final     Organism ID, Bacteria  METHICILLIN RESISTANT STAPHYLOCOCCUS  AUREUS   Final    ANAEROBIC CULTURE Status: Normal    Collection Time    09/23/12 8:45 AM   Component  Value  Range  Status  Comment    Specimen Description  ABSCESS RIGHT FOOT   Final  Special Requests  RIGHT HEEL PT ON ZOSYN AND VANCO   Final     Gram Stain    Final     Value:  NO WBC SEEN     RARE SQUAMOUS EPITHELIAL CELLS PRESENT     RARE GRAM POSITIVE COCCI     IN PAIRS    Culture  NO ANAEROBES ISOLATED   Final     Report Status  09/28/2012 FINAL   Final    CULTURE, BLOOD (ROUTINE X 2) Status: Normal    Collection Time    09/24/12 12:40 PM   Component  Value  Range  Status  Comment    Specimen Description  BLOOD LEFT ARM   Final     Special Requests  BOTTLES DRAWN AEROBIC AND ANAEROBIC 10CC   Final     Culture Setup Time  09/24/2012 16:59   Final     Culture  NO GROWTH 5 DAYS   Final     Report Status  09/30/2012 FINAL   Final    CULTURE, BLOOD (ROUTINE X 2) Status: Normal    Collection Time    09/24/12 12:50 PM   Component  Value  Range  Status  Comment    Specimen Description  BLOOD LEFT HAND   Final     Special Requests  BOTTLES DRAWN AEROBIC ONLY 5CC   Final     Culture Setup Time  09/24/2012 16:59   Final     Culture  NO GROWTH 5 DAYS   Final     Report Status  09/30/2012 FINAL   Final    CULTURE, BLOOD (ROUTINE X 2) Status: Normal (Preliminary result)    Collection Time    09/25/12 12:35 PM   Component  Value  Range  Status  Comment    Specimen Description  BLOOD RIGHT ARM   Final     Special Requests  BOTTLES DRAWN AEROBIC AND ANAEROBIC 10CC   Final     Culture Setup Time  09/25/2012 17:08   Final     Culture    Final     Value:  BLOOD CULTURE RECEIVED NO GROWTH TO DATE CULTURE WILL BE HELD FOR 5 DAYS BEFORE ISSUING A FINAL NEGATIVE REPORT    Report Status  PENDING   Incomplete    CULTURE, BLOOD (ROUTINE X 2) Status: Normal (Preliminary result)    Collection Time    09/25/12 12:42 PM   Component  Value  Range  Status  Comment    Specimen Description  BLOOD  RIGHT ARM   Final     Special Requests  BOTTLES DRAWN AEROBIC AND ANAEROBIC 10CC   Final     Culture Setup Time  09/25/2012 17:08   Final     Culture    Final     Value:  BLOOD CULTURE RECEIVED NO GROWTH TO DATE CULTURE WILL BE HELD FOR 5 DAYS BEFORE ISSUING A FINAL NEGATIVE REPORT    Report Status  PENDING   Incomplete       Signed: Ky Barban 10/10/2012, 9:45 PM   Time Spent on Discharge: 60 minutes Services Ordered on Discharge: Home Health PT, SW, RN (patient has PICC line) Equipment Ordered on Discharge: Rolling walker with 5 wheels, IV Vancomycin for 3 weeks duration following her discharge.

## 2012-10-13 ENCOUNTER — Ambulatory Visit: Payer: Self-pay | Admitting: Internal Medicine

## 2012-10-14 ENCOUNTER — Telehealth: Payer: Self-pay | Admitting: Licensed Clinical Social Worker

## 2012-10-14 NOTE — Telephone Encounter (Signed)
Ms. Manchester placed call to CSW and left message stating she needs assistance with transportation to [facility] appt.  CSW placed call to Ms. Havrilla unable to leave message.  CSW able to mail pt 2 bus fare passes, but unsure if pt will get them before scheduled appt on 11/22.  CSW will attempt to reach pt at later time.

## 2012-10-16 ENCOUNTER — Ambulatory Visit (INDEPENDENT_AMBULATORY_CARE_PROVIDER_SITE_OTHER): Payer: Self-pay | Admitting: Internal Medicine

## 2012-10-16 ENCOUNTER — Encounter: Payer: Self-pay | Admitting: Internal Medicine

## 2012-10-16 VITALS — BP 149/89 | HR 91 | Temp 98.5°F | Ht 63.0 in | Wt 158.1 lb

## 2012-10-16 DIAGNOSIS — M86179 Other acute osteomyelitis, unspecified ankle and foot: Secondary | ICD-10-CM

## 2012-10-16 DIAGNOSIS — I10 Essential (primary) hypertension: Secondary | ICD-10-CM

## 2012-10-16 DIAGNOSIS — E119 Type 2 diabetes mellitus without complications: Secondary | ICD-10-CM

## 2012-10-16 DIAGNOSIS — M86171 Other acute osteomyelitis, right ankle and foot: Secondary | ICD-10-CM

## 2012-10-16 DIAGNOSIS — D72829 Elevated white blood cell count, unspecified: Secondary | ICD-10-CM

## 2012-10-16 LAB — CBC WITH DIFFERENTIAL/PLATELET
Hemoglobin: 11.2 g/dL — ABNORMAL LOW (ref 12.0–15.0)
Lymphocytes Relative: 22 % (ref 12–46)
Lymphs Abs: 1.9 10*3/uL (ref 0.7–4.0)
Monocytes Relative: 11 % (ref 3–12)
Neutrophils Relative %: 63 % (ref 43–77)
Platelets: 399 10*3/uL (ref 150–400)
RBC: 4.56 MIL/uL (ref 3.87–5.11)
WBC: 8.3 10*3/uL (ref 4.0–10.5)

## 2012-10-16 LAB — LIPID PANEL
Cholesterol: 218 mg/dL — ABNORMAL HIGH (ref 0–200)
HDL: 59 mg/dL (ref >39)
Total CHOL/HDL Ratio: 3.7 Ratio
Triglycerides: 112 mg/dL (ref <150)
VLDL: 22 mg/dL (ref 0–40)

## 2012-10-16 LAB — BASIC METABOLIC PANEL WITH GFR
CO2: 28 mEq/L (ref 19–32)
Calcium: 9.2 mg/dL (ref 8.4–10.5)
Chloride: 105 mEq/L (ref 96–112)
Potassium: 4.1 mEq/L (ref 3.5–5.3)
Sodium: 138 mEq/L (ref 135–145)

## 2012-10-16 MED ORDER — HYDROCHLOROTHIAZIDE 25 MG PO TABS: 25.0000 mg | ORAL_TABLET | Freq: Every day | ORAL | Status: DC

## 2012-10-16 MED ORDER — DOXYCYCLINE HYCLATE 100 MG PO TABS: 100.0000 mg | ORAL_TABLET | Freq: Two times a day (BID) | ORAL | Status: DC

## 2012-10-16 MED ORDER — OXYCODONE-ACETAMINOPHEN 5-325 MG PO TABS: 1.0000 | ORAL_TABLET | Freq: Four times a day (QID) | ORAL | Status: DC | PRN

## 2012-10-16 MED ORDER — GLIMEPIRIDE 4 MG PO TABS: 4.0000 mg | ORAL_TABLET | Freq: Every day | ORAL | Status: DC

## 2012-10-16 NOTE — Progress Notes (Signed)
  Subjective:    Patient ID: Linda Miller, female    DOB: 03/03/1956, 56 y.o.   MRN: [id number]  HPI patient is a 56 year woman with uncontrolled DM 2, hypertension, osteomyelitis and cellulitis of right foot and other medical problems as per problem list who comes to the clinic for hospital followup visit. She was admitted in hospital for her right foot cellulitis and osteomyelitis. She had surgery per orthopedics while in hospital and was discharged home with home health physical therapy, nurse and self dressing of wound. She saw Dr. Lajoyce Corners on last Friday and was doing good on that day. Couple of days before she hit her right foot while going to bathroom and bursted open last 3 stitches. After that she reports worsening of her foot pain or swelling. She did not call Dr. Audrie Lia office. She reports that she continues taking IV vancomycin per PICC line.   Diabetes- she says she is taking insulin as instructed but she missed this morning dose. Although she has not started taking metformin or Amaryl. I discussed with her in detail that she should be on the pill and insulin. She thought she is supposed to be just on insulin.  She denies any fever, chills, nausea, vomiting, chest pain, short of breath, abdominal pain, diarrhea.    Review of Systems    as per history of present illness. Objective:   Physical Exam  General: Patient in pain. In wheelchair. HEENT: PERRL, EOMI, no scleral icterus Cardiac: S1, S2, RRR, no rubs. Pulm: clear to auscultation bilaterally, moving normal volumes of air Abd: soft, nontender, nondistended, BS present Ext: warm and well perfused. Right foot post surgical open wound- last 3 stitches open. Serous discharge. Tenderness to palpation. Surrounding mild redness. Neuro: alert and oriented X3, cranial nerves II-XII grossly intact       Assessment & Plan:

## 2012-10-16 NOTE — Assessment & Plan Note (Signed)
Lab Results  Component Value Date   HGBA1C 15.6* 09/21/2012   CREATININE 1.05 10/05/2012   MICROALBUR 0.50 02/12/2011   CHOL 311* 11/20/2010   HDL 66 11/20/2010   TRIG 243* 11/20/2010    Last eye exam and foot exam: No results found for this basename: HMDIABEYEEXA, HMDIABFOOTEX    Assessment: Diabetes control: not controlled due to intercurrent illness Progress toward goals: unable to assess Barriers to meeting goals: nonadherence to medications  Plan: Diabetes treatment: Patient was taking insulin but not metformin or Amaryl. I sent a prescription of Amaryl to pharmacy. Patient does have bottle of metformin with. She was extensively counseled to start taking diabetic pills along with insulin. Refer to: diabetes educator for self-management training, diabetes educator for medical nutrition therapy, nutritionist and social worker Instruction/counseling given: reminded to bring blood glucose meter & log to each visit, reminded to bring medications to each visit, discussed foot care and discussed diet

## 2012-10-16 NOTE — Assessment & Plan Note (Signed)
Lab Results  Component Value Date   NA 137 10/05/2012   K 4.0 10/05/2012   CL 101 10/05/2012   CO2 29 10/05/2012   BUN 9 10/05/2012   CREATININE 1.05 10/05/2012    BP Readings from Last 3 Encounters:  10/16/12 149/89  10/05/12 126/59  09/30/12 114/71    Assessment: Hypertension control:  mildly elevated  Progress toward goals:  deteriorated Barriers to meeting goals:  nonadherence to medications  Plan: Hypertension treatment:  Unclear if patient taking HCTZ. Reeducated her today and send a prescription of HCTZ 25 mg daily to her pharmacy.Marland Kitchen

## 2012-10-16 NOTE — Telephone Encounter (Signed)
CSW placed call to Ms. Bartley.  Pt not at home at this time, pt's brother answered phone.  CSW left message with pt's brother.  CSW provided name and contact and indicated CSW has bus pass available to/from doctor appt's if needed.  Pt has appt today at 1100, will attempt to meet with pt and determine need.

## 2012-10-16 NOTE — Patient Instructions (Signed)
Please followup with Dr. Lajoyce Corners on Monday at 8 AM.  - Meanwhile clean the wound with dial soap once every day. - Please follow strict non-weight bearing on your right foot. - Continue taking vancomycin IV. - Start taking doxycycline 100 mg twice daily for next 2 weeks. This is the new antibiotic. Prescription is in the pharmacy. - I will give you 20 tablets of Percocet today.   Diabetes: Continue taking insulin. - You also have to take metformin and Amaryl as prescribed. - Metformin 1 tablet twice daily and Amaryl 1 tablet before breakfast. - I have sent a prescription of Amaryl and hydrochlorothiazide to pharmacy.

## 2012-10-16 NOTE — Assessment & Plan Note (Signed)
Last WBC 9.1 prior to hospital discharge. - Will repeat CBC with differential today

## 2012-10-16 NOTE — Assessment & Plan Note (Addendum)
Patient with recent I&D of right foot and ankle per Dr. Lajoyce Corners in hospital. Patient sent home on IV vancomycin for 4 weeks per PICC line. Patient doing great until 2 days before- hit her foot and burst open distal 3 stitches. Surrounding swelling and redness present with increased pain. - Called Dr. Audrie Lia office and discussed about need for appointment today. - He recommended instructing patient to continue washing daily with dial soap, total nonweightbearing on right foot and at doxycycline 100 mg twice daily for [redacted] weeks along with vancomycin. - He will see the patient on Monday at 8 AM.  - Patient is made aware of all these above changes and patient reiterated the same.  - Will check CBC with differential, BMP and lipid panel today.

## 2012-10-26 ENCOUNTER — Telehealth: Payer: Self-pay | Admitting: *Deleted

## 2012-10-26 ENCOUNTER — Telehealth: Payer: Self-pay | Admitting: Licensed Clinical Social Worker

## 2012-10-26 NOTE — Telephone Encounter (Signed)
[facility] calls to discuss abx and PICC, she is referred to dr duda's office and will call for new orders, if there is a problem she will call back to the [phone]

## 2012-10-26 NOTE — Telephone Encounter (Signed)
Pt placed call to CSW and left message requesting assistance with transportation.  CSW returned call and pt states she has an appt on 12/5 with [facility] and is in need of transportation.  CSW offered Linda Miller bus pass.  Pt states she can barely walk and would be unable to utilized a fixed route transport.  Pt asked if CSW had SCAT passes, CSW does not have access to SCAT passes at this time.  Linda Miller stated she was 88, CSW provided Linda Miller with the number to [employer], but informed pt program works on a volunteer basis.

## 2012-10-28 ENCOUNTER — Telehealth: Payer: Self-pay | Admitting: Dietician

## 2012-10-28 NOTE — Telephone Encounter (Signed)
Per patient : Blood sugars Up and down, writing in book:  On Nov 25:157-155               26:    274 (after eating) -191-66    27:161    28:[id number]    30:[phone]         Dec 1-   134        [phone]       3-153      4- 240-138  She says she has gained 18# since getting discharged from hospital. Encouraged patient to recheck blood sugar after lows under 70 and also to try to decrease her portions sizes a little to help decrease her weight and blood sugars.   Patient reports she dropped a drawer on her foot and it is swollen- nurse coming out to look at her foot today. She called Dr. Audrie Lia office for increased pain since stiches out. Patient has our phone number in case nurse needs to call us.

## 2012-10-29 ENCOUNTER — Inpatient Hospital Stay: Payer: Self-pay | Admitting: Internal Medicine

## 2012-10-29 ENCOUNTER — Telehealth: Payer: Self-pay | Admitting: *Deleted

## 2012-10-29 NOTE — Telephone Encounter (Signed)
Pt was called and states a nurse came out and looked at her foot. Pt states she noted some swelling. Pt's weight has not changed since last visit in our office. Pt will call for any changes. Pain med was ordered by Ortho MD

## 2012-10-29 NOTE — Telephone Encounter (Signed)
RN called home and mobile phone numbers to notify patient that she had missed this morning's appointment, but never got an answer and was unable to leave a message. Linda Miller

## 2012-10-30 NOTE — Telephone Encounter (Signed)
Patient says swelling is going down in her foot and it is feeling better

## 2012-11-05 ENCOUNTER — Ambulatory Visit (INDEPENDENT_AMBULATORY_CARE_PROVIDER_SITE_OTHER): Payer: Self-pay | Admitting: Dietician

## 2012-11-05 ENCOUNTER — Encounter: Payer: Self-pay | Admitting: Dietician

## 2012-11-05 DIAGNOSIS — E119 Type 2 diabetes mellitus without complications: Secondary | ICD-10-CM

## 2012-11-05 NOTE — Patient Instructions (Addendum)
Please call DR. Lajoyce Corners about cost of your antibiotic and a new pain medicine that you can take.   PLease take your insulin a d eat about the same times each day  Example:  Wake up 8 am Test blood sugar 830-845,  Take insulin 845 Eat at 9 am.  Check blood sugar 12:45PM Eat lunch 1 Pm  Test blood sugar 645,  Take insulin 650 Eat at 7 PM  Please always carry your meter and food or glucose tablets with you at all times.   Once you get Medicaid - please change to an [employer] meter- call me for supplies

## 2012-11-05 NOTE — Progress Notes (Signed)
Medical Nutrition Therapy:  Appt start time: 1050 end time:  1130.  Assessment:  Primary concerns today: Blood sugar control.  Usual eating pattern includes 3 meals and 0-1 snacks per day. Usual physical activity includes limited by foot wound- does stretching and chair exercises. Has supportive friends. Currently staying with parents and eating 3 well balanced meals a day. Struggles with eating times. Meter download shows average of 172, 3 lows nocturnally. Patient is treating appropriately.     Progress Towards Goal(s):  In progress.   Nutritional Diagnosis:  NB-1.1 Food and nutrition-related knowledge deficit as related to lack of prior exposure to diabetes meal planning NB-1.4 Self-monitoring deficit As related to lack of adequate supplies to self monitor blood sugars more frequently  As evidenced by her report.    Intervention:  Nutrition education about carb consistency and bedtime snacks.  Monitoring/Evaluation:  Dietary intake, exercise, blood sugars, and body weight in 4 week(s)

## 2012-11-12 ENCOUNTER — Other Ambulatory Visit: Payer: Self-pay | Admitting: *Deleted

## 2012-11-12 ENCOUNTER — Telehealth: Payer: Self-pay | Admitting: Dietician

## 2012-11-12 MED ORDER — METFORMIN HCL 1000 MG PO TABS: 1000.0000 mg | ORAL_TABLET | Freq: Two times a day (BID) | ORAL | Status: DC

## 2012-11-12 NOTE — Telephone Encounter (Signed)
Suggested patient call and discuss this with [employer] as she will be getting medicaid 2nd week of January.

## 2012-11-13 NOTE — Telephone Encounter (Signed)
Patient got medicines transferred to [facility], has not called [employer] [facility] about her strips. CDE will leave 10 strips at front desk for pt to pick up.

## 2012-12-03 ENCOUNTER — Encounter: Payer: Self-pay | Admitting: Internal Medicine

## 2012-12-15 ENCOUNTER — Telehealth: Payer: Self-pay | Admitting: Internal Medicine

## 2012-12-16 NOTE — Telephone Encounter (Signed)
Patient was sent a letter 12-03-12 to get in contact with [place] to schedule her HM visit.  Letter was returned due to incorrect mailing address.

## 2012-12-29 ENCOUNTER — Ambulatory Visit (INDEPENDENT_AMBULATORY_CARE_PROVIDER_SITE_OTHER): Payer: Self-pay | Admitting: Internal Medicine

## 2012-12-29 ENCOUNTER — Encounter: Payer: Self-pay | Admitting: Internal Medicine

## 2012-12-29 ENCOUNTER — Ambulatory Visit (INDEPENDENT_AMBULATORY_CARE_PROVIDER_SITE_OTHER): Payer: Self-pay | Admitting: Dietician

## 2012-12-29 ENCOUNTER — Encounter: Payer: Self-pay | Admitting: Dietician

## 2012-12-29 VITALS — BP 101/70 | HR 82 | Temp 96.5°F | Ht 63.0 in | Wt 161.3 lb

## 2012-12-29 DIAGNOSIS — L03115 Cellulitis of right lower limb: Secondary | ICD-10-CM

## 2012-12-29 DIAGNOSIS — Z79899 Other long term (current) drug therapy: Secondary | ICD-10-CM

## 2012-12-29 DIAGNOSIS — L02619 Cutaneous abscess of unspecified foot: Secondary | ICD-10-CM

## 2012-12-29 DIAGNOSIS — I10 Essential (primary) hypertension: Secondary | ICD-10-CM

## 2012-12-29 DIAGNOSIS — M86171 Other acute osteomyelitis, right ankle and foot: Secondary | ICD-10-CM

## 2012-12-29 DIAGNOSIS — M869 Osteomyelitis, unspecified: Secondary | ICD-10-CM

## 2012-12-29 DIAGNOSIS — E119 Type 2 diabetes mellitus without complications: Secondary | ICD-10-CM

## 2012-12-29 DIAGNOSIS — E785 Hyperlipidemia, unspecified: Secondary | ICD-10-CM

## 2012-12-29 LAB — GLUCOSE, CAPILLARY: Glucose-Capillary: 123 mg/dL — ABNORMAL HIGH (ref 70–99)

## 2012-12-29 MED ORDER — GLIMEPIRIDE 4 MG PO TABS: 4.0000 mg | ORAL_TABLET | Freq: Every day | ORAL | Status: DC

## 2012-12-29 MED ORDER — PRAVASTATIN SODIUM 20 MG PO TABS: 20.0000 mg | ORAL_TABLET | Freq: Every day | ORAL | Status: DC

## 2012-12-29 NOTE — Progress Notes (Signed)
Medical Nutrition Therapy:  Appt start time: 950 end time:  1020.  Assessment:  Primary concerns today: Blood sugar control.  Continues to eat 3 meals and 0-1 snacks per day. Usual physical activity limited by foot wound- does stretching and chair exercises. Eating several servings fruits and vegetables daily and some dairy and fried foods. A1C 7%, weight increased 3#- patient trying to cut portions back but finding it difficult while quitting smoking. Denies desire for social work assistance.   Progress Towards Goal(s):  In progress.   Nutritional Diagnosis:  NB-1.1 Food and nutrition-related knowledge deficit as related to lack of prior exposure to diabetes meal planning resolved.  NB-1.4 Self-monitoring deficit As related to lack of adequate supplies to self monitor blood sugars more frequently  As evidenced by her report is ongoing due to no insurance.     Intervention:  Nutrition support for eating healthier provided- encouraged increased activity and decreased intake of saturated and total fat.  Monitoring/Evaluation:  Dietary intake, exercise, blood sugars, and body weight in 4 week(s)

## 2012-12-29 NOTE — Assessment & Plan Note (Signed)
Currently on no medication. Well controlled. Continue to monitor

## 2012-12-29 NOTE — Assessment & Plan Note (Signed)
Will initiate Pravastatin 20 mg daily. Patient reports that she had some trouble with Crestor in the past including muscle aches. I will give a trial with pravastatin. I will have her come back in one month.

## 2012-12-29 NOTE — Assessment & Plan Note (Signed)
Diabetes well controlled. Will continue Metformin and Amaryl.  Lab Results  Component Value Date   HGBA1C 7.0 12/29/2012
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
## 2012-12-29 NOTE — Assessment & Plan Note (Signed)
Had completed Vancomycin IV currently on Bactrim DS . Will follow up with Dr Lajoyce Corners on 2/12.

## 2012-12-31 ENCOUNTER — Telehealth: Payer: Self-pay | Admitting: *Deleted

## 2012-12-31 NOTE — Telephone Encounter (Signed)
No answer will try again, lm for rtc

## 2013-02-02 ENCOUNTER — Ambulatory Visit: Payer: Self-pay | Admitting: Dietician

## 2013-02-23 ENCOUNTER — Ambulatory Visit: Payer: Self-pay | Admitting: Dietician

## 2013-02-23 ENCOUNTER — Other Ambulatory Visit: Payer: Self-pay | Admitting: Internal Medicine

## 2013-02-23 DIAGNOSIS — E119 Type 2 diabetes mellitus without complications: Secondary | ICD-10-CM

## 2013-03-02 ENCOUNTER — Ambulatory Visit (INDEPENDENT_AMBULATORY_CARE_PROVIDER_SITE_OTHER): Payer: Medicaid Other | Admitting: Dietician

## 2013-03-02 ENCOUNTER — Other Ambulatory Visit: Payer: Self-pay | Admitting: Dietician

## 2013-03-02 VITALS — Ht 63.0 in | Wt 173.0 lb

## 2013-03-02 DIAGNOSIS — E119 Type 2 diabetes mellitus without complications: Secondary | ICD-10-CM

## 2013-03-02 NOTE — Patient Instructions (Addendum)
You need an appointment to see your doctorCollier Miller) now-   You need to see an eye doctor.   Call [facility] before you go to see if syringes, insulin and test strips are there.   Remember your schedule:   7 am- 9 am  Check sugar   Take insulin and other am medicine   Eat breakfast-   12-1 PM   Eat lunch  5-7 Pm  Check sugar   Take insulin   Eat breakfast   Sweets: substitute for other carbs in your meal plan: can eat one sweet a day instead of a milk, yogurt, fruit or starch

## 2013-03-02 NOTE — Telephone Encounter (Signed)
Out of syringes, paid cash for test strips. Needs insulin and asking for pain medicine instead of tylenol or ibuprophen for foot pain since starting to walk on foot. She said she'd call Dr. Lajoyce Corners for the pain medicine. Asked patient to schedule appointment with Dr. Collier Bullock ASAP.

## 2013-03-02 NOTE — Progress Notes (Signed)
Diabetes Self-Management Education  Visit Number: First/Initial  03/02/2013 [name], identified by name and date of birth, is a 57 y.o. female with Diabetes Type: Type 2.  Other people present during visit:    no  ASSESSMENT  Patient Concerns:  Nutrition/Meal planning;Glycemic Control;Weight Control  Height 5\' 3"  (1.6 m), weight 173 lb (78.472 kg). Body mass index is 30.65 kg/(m^2).  Lab Results: LDL Cholesterol  Date Value Range Status  10/16/2012 137* 0 - 99 mg/dL Final           Hemoglobin A1C  Date Value Range Status  12/29/2012 7.0   Final  09/21/2012 15.6* <5.7 % Final     (NOTE)                                                                              Family History  Problem Relation Age of Onset  . Diabetes Mother   . Cancer Father    History  Substance Use Topics  . Smoking status: Current Some Day Smoker -- 0.20 packs/day    Types: Cigarettes  . Smokeless tobacco: Not on file     Comment: Slowly cutting back.  . Alcohol Use: No    Support Systems:  Friends;Parents Special Needs:  Simplified materials  Prior DM Education:   Daily Foot Exams: Yes Patient Belief / Attitude about Diabetes:    diabetes can be controlled  Assessment comments: weight increased, patient agrees that this is of concern. Has stopped eating on schedule and is snacking more. Blood sugars higher during day- average of past 30 days 201 mg/dl.    Diet Recall: cheese and eggs, bread and oatmeal for breakfast, yogurt, fruit peanuts for snacks, hot dog and lemonade for lunch, chicken and rice for dinner.  Individualized Plan for Diabetes Self-Management Training:  Patient individualized diabetes plan discussed today with patient and includes:  Nutrition, medications, monitoring, physical activity, how to handle highs and lows, Special care for my body when I have diabetes.   Education Topics Reviewed with Patient Today:  Topic Points Discussed  Disease State    Nutrition  Management Role of diet in the treatment of diabetes and the relationship between the three main macronutrients and blood glucose level;Meal timing in regards to the patients' current diabetes medication.  Physical Activity and Exercise Role of exercise on diabetes management, blood pressure control and cardiac health.  Medications    Monitoring Other (comment) taught patient how to mark after meal blood sugars  Acute Complications    Chronic Complications    Psychosocial Adjustment    Goal Setting    Preconception Care (if applicable)      PATIENTS GOALS   Learning Objective(s):     Goal The patient agrees to:  Nutrition Follow meal plan discussed;Other (comment) try to eat about the same times each day.   Physical Activity    Medications    Monitoring    Problem Solving    Reducing Risk    Health Coping     Patient Self-Evaluation of Goals (Subsequent Visits)  Goal The patient rates self as meeting goals (% of time)  Nutrition  eating less fried foods, more fruit and vegetables >75% of the  time ( this goal from MNT visits)   Physical Activity    Medications    Monitoring    Problem Solving    Reducing Risk    Health Coping       PERSONALIZED PLAN / SUPPORT  Self-Management Support:  Doctor's office;Friends;Family;CDE visits ______________________________________________________________________  Outcomes  Expected Outcomes:  Demonstrated interest in learning. Expect positive outcomes Self-Care Barriers:  Low literacy;Unsteady gait/risk for falls;Lack of material resources Education material provided: yes If problems or questions, patient to contact team via:  Phone Time in: 0830     Time out: 0930 Future DSME appointment: - 2 months   Izzah Pasqua, [name] 03/02/2013 11:22 AM         Addendum- patient does not want to attend diabetes group classes due to inability to ambulate

## 2013-03-03 LAB — MICROALBUMIN / CREATININE URINE RATIO
Creatinine, Urine: 102.6 mg/dL
Microalb, Ur: 0.52 mg/dL (ref 0.00–1.89)

## 2013-03-03 MED ORDER — INSULIN SYRINGES (DISPOSABLE) U-100 0.3 ML MISC: Status: DC

## 2013-03-03 MED ORDER — GLUCOSE BLOOD VI STRP: ORAL_STRIP | Status: DC

## 2013-03-03 MED ORDER — INSULIN ASPART PROT & ASPART (70-30 MIX) 100 UNIT/ML ~~LOC~~ SUSP: SUBCUTANEOUS | Status: DC

## 2013-03-03 NOTE — Telephone Encounter (Signed)
Agree, patient needs a follow up appointment in clinic.

## 2013-03-04 ENCOUNTER — Other Ambulatory Visit: Payer: Self-pay | Admitting: *Deleted

## 2013-03-04 DIAGNOSIS — L03115 Cellulitis of right lower limb: Secondary | ICD-10-CM

## 2013-04-01 ENCOUNTER — Encounter: Payer: Medicaid Other | Admitting: Internal Medicine

## 2013-05-04 ENCOUNTER — Ambulatory Visit: Payer: Medicaid Other | Admitting: Dietician

## 2013-05-14 ENCOUNTER — Encounter: Payer: Self-pay | Admitting: Physical Medicine & Rehabilitation

## 2013-06-03 ENCOUNTER — Other Ambulatory Visit: Payer: Self-pay

## 2013-06-15 ENCOUNTER — Ambulatory Visit: Payer: Medicaid Other | Admitting: Physical Medicine & Rehabilitation

## 2013-07-12 ENCOUNTER — Ambulatory Visit: Payer: Medicaid Other | Admitting: Dietician

## 2013-07-12 ENCOUNTER — Telehealth: Payer: Self-pay | Admitting: Dietician

## 2013-07-12 NOTE — Telephone Encounter (Signed)
Patient missed appointment due to lack of transportattion- she didn't call [employer] bus in time. She asks that we try to reschedule for one morning next week about 9 AM and she'd ;like to see a doctor on same day. Will request assistance of front office and call patient back with day and time

## 2013-07-13 NOTE — Telephone Encounter (Signed)
Patient calls asking about appointment for next week. Patient informed that our front office instructed her to call them on the appointment line to schedule her appointments.

## 2013-07-13 NOTE — Telephone Encounter (Signed)
Patient was not willing to see another doctor and no appointment avaible with her doctor until November. Of note, she missed her previous follow up appointments.

## 2013-07-16 ENCOUNTER — Encounter: Payer: Medicaid Other | Attending: Physical Medicine & Rehabilitation

## 2013-07-16 ENCOUNTER — Ambulatory Visit: Payer: Medicaid Other | Admitting: Physical Medicine & Rehabilitation

## 2013-07-20 ENCOUNTER — Ambulatory Visit: Payer: Medicaid Other | Admitting: Dietician

## 2013-07-20 ENCOUNTER — Encounter: Payer: Self-pay | Admitting: Internal Medicine

## 2013-07-29 ENCOUNTER — Ambulatory Visit: Payer: Medicaid Other | Admitting: Dietician

## 2013-07-30 ENCOUNTER — Telehealth: Payer: Self-pay | Admitting: *Deleted

## 2013-07-30 ENCOUNTER — Telehealth: Payer: Self-pay | Admitting: Dietician

## 2013-07-30 NOTE — Telephone Encounter (Signed)
CDE Called to remind patient she can get flu  Vaccine at  rescheduled appointment on 08/16/13. She asked if she could get eye exam here as well. Will find out and call her back; if not will obtain referral and schedule appointment.   Patient asked about pain medicine and was referred to call the main office number and choose the option for the triage nurse

## 2013-07-30 NOTE — Telephone Encounter (Signed)
Pt called - needs pain med for right foot - has a opening right heel past 2 days. Black around  edges per pt. Unable to come to clinic today due to transportation . Suggest for pt to go to ER - if need be ER can contact ortho doctor. Stanton Kidney Johnie Stadel RN 07/30/13 2PM

## 2013-08-10 ENCOUNTER — Encounter (HOSPITAL_COMMUNITY): Payer: Self-pay | Admitting: *Deleted

## 2013-08-10 ENCOUNTER — Emergency Department (HOSPITAL_COMMUNITY)
Admission: EM | Admit: 2013-08-10 | Discharge: 2013-08-10 | Disposition: A | Discharge status: Home / Self Care | Payer: Medicaid Other | Attending: Emergency Medicine | Admitting: Emergency Medicine

## 2013-08-10 DIAGNOSIS — I10 Essential (primary) hypertension: Secondary | ICD-10-CM | POA: Insufficient documentation

## 2013-08-10 DIAGNOSIS — Z79899 Other long term (current) drug therapy: Secondary | ICD-10-CM | POA: Insufficient documentation

## 2013-08-10 DIAGNOSIS — T6391XA Toxic effect of contact with unspecified venomous animal, accidental (unintentional), initial encounter: Secondary | ICD-10-CM | POA: Insufficient documentation

## 2013-08-10 DIAGNOSIS — Z862 Personal history of diseases of the blood and blood-forming organs and certain disorders involving the immune mechanism: Secondary | ICD-10-CM | POA: Insufficient documentation

## 2013-08-10 DIAGNOSIS — T63461A Toxic effect of venom of wasps, accidental (unintentional), initial encounter: Secondary | ICD-10-CM | POA: Insufficient documentation

## 2013-08-10 DIAGNOSIS — R110 Nausea: Secondary | ICD-10-CM | POA: Insufficient documentation

## 2013-08-10 DIAGNOSIS — E119 Type 2 diabetes mellitus without complications: Secondary | ICD-10-CM | POA: Insufficient documentation

## 2013-08-10 DIAGNOSIS — L299 Pruritus, unspecified: Secondary | ICD-10-CM | POA: Insufficient documentation

## 2013-08-10 DIAGNOSIS — Z794 Long term (current) use of insulin: Secondary | ICD-10-CM | POA: Insufficient documentation

## 2013-08-10 DIAGNOSIS — Z8639 Personal history of other endocrine, nutritional and metabolic disease: Secondary | ICD-10-CM | POA: Insufficient documentation

## 2013-08-10 DIAGNOSIS — Y929 Unspecified place or not applicable: Secondary | ICD-10-CM | POA: Insufficient documentation

## 2013-08-10 DIAGNOSIS — Y9389 Activity, other specified: Secondary | ICD-10-CM | POA: Insufficient documentation

## 2013-08-10 DIAGNOSIS — F17200 Nicotine dependence, unspecified, uncomplicated: Secondary | ICD-10-CM | POA: Insufficient documentation

## 2013-08-10 DIAGNOSIS — M25519 Pain in unspecified shoulder: Secondary | ICD-10-CM | POA: Insufficient documentation

## 2013-08-10 DIAGNOSIS — R631 Polydipsia: Secondary | ICD-10-CM | POA: Insufficient documentation

## 2013-08-10 LAB — GLUCOSE, CAPILLARY

## 2013-08-10 MED ORDER — HYDROCODONE-ACETAMINOPHEN 5-325 MG PO TABS: 1.0000 | ORAL_TABLET | Freq: Four times a day (QID) | ORAL | Status: DC | PRN

## 2013-08-10 MED ORDER — DEXAMETHASONE SODIUM PHOSPHATE 10 MG/ML IJ SOLN: 10.0000 mg | Freq: Once | INTRAMUSCULAR | Status: DC

## 2013-08-10 MED ORDER — HYDROCODONE-ACETAMINOPHEN 5-325 MG PO TABS
1.0000 | ORAL_TABLET | Freq: Once | ORAL | Status: AC
  Administered 2013-08-10: 1 via ORAL
  Filled 2013-08-10: qty 1

## 2013-08-10 NOTE — ED Provider Notes (Signed)
Medical screening examination/treatment/procedure(s) were performed by non-physician practitioner and as supervising physician I was immediately available for consultation/collaboration.  Donnetta Hutching, MD 08/10/13 2234

## 2013-08-10 NOTE — ED Notes (Signed)
Pt states she got stung by yellow jackets two days ago, pt c/o itching, pain and burning to legs and groin area. Pt states she used some yeast infection cream on the areas but had no relief, pt states she also took benedryl. Pt states she has not taken insulin and oral diabetes medications because she was scared of a reaction. Pt states she does not feel right, has not been feeling like herself. Pt ate en route to ED.

## 2013-08-10 NOTE — ED Provider Notes (Signed)
CSN: [id number]     Arrival date & time 08/10/13  1346 History   First MD Initiated Contact with Patient 08/10/13 1507     Chief Complaint  Patient presents with  . Insect Bite   (Consider location/radiation/quality/duration/timing/severity/associated sxs/prior Treatment) HPI 13 YOF with a history of diabetes presents to the emergency department with multiple yellow jacket stings that occurred on Sunday. Patient reports she was letting her dog out Sunday morning and multiple yellow jackets stung her inner thigh, groin, and vaginal area. She did not see any stingers left in her leg. Areas have become progressively more itchy and painful since Sunday. Pain is a 11/10 and itching is a 9/10. She has tried Benadryl which helped relieve the itching some, Advil, and Bleach. She has noticed burning while urinating and specks of blood on the toilet paper when she wipes due to the stings in her vaginal area. She also notes polydipsia, itchy throat, shoulder aches, and nausea which worsened today. Denies fever, chills, vomiting, abdominal pain, shortness of breath, trouble swallowing, heart palpitations, chest pain. She stopped taking her diabetes medication on Sunday because she was worried of the interaction. She usually takes 17 units of Novalog, Metformin, and Amaryl. Past Medical History  Diagnosis Date  . Diabetes mellitus   . Hypertension   . High cholesterol    Past Surgical History  Procedure Laterality Date  . Abdominal hysterectomy    . Debridement  foot    . I&d extremity  09/23/2012    Procedure: IRRIGATION AND DEBRIDEMENT EXTREMITY;  Surgeon: Nadara Mustard, MD;  Location: [facility] OR;  Service: Orthopedics;  Laterality: Right;  . Tee without cardioversion  09/24/2012    Procedure: TRANSESOPHAGEAL ECHOCARDIOGRAM (TEE);  Surgeon: Laurey Morale, MD;  Location[facility] ENDOSCOPY;  Service: Cardiovascular;  Laterality: N/A;   Family History  Problem Relation Age of Onset  . Diabetes Mother   .  Cancer Father    History  Substance Use Topics  . Smoking status: Current Some Day Smoker -- 0.20 packs/day    Types: Cigarettes  . Smokeless tobacco: Not on file     Comment: Slowly cutting back.  . Alcohol Use: No   OB History   Grav Para Term Preterm Abortions TAB SAB Ect Mult Living                 Review of Systems All other systems negative except as documented in the HPI. All pertinent positives and negatives as reviewed in the HPI.  Allergies  Codeine and Tramadol  Home Medications   Current Outpatient Rx  Name  Route  Sig  Dispense  Refill  . glimepiride (AMARYL) 4 MG tablet   Oral   Take 1 tablet (4 mg total) by mouth daily before breakfast.   30 tablet   2   . HYDROcodone-acetaminophen (NORCO/VICODIN) 5-325 MG per tablet   Oral   Take 1 tablet by mouth every 6 (six) hours as needed for pain.          Marland Kitchen insulin aspart protamine-insulin aspart (NOVOLOG MIX 70/30) (70-30) 100 UNIT/ML injection      Inject 17 units with breakfast & 13 units  with dinner each day   10 mL   4   . metFORMIN (GLUCOPHAGE) 1000 MG tablet   Oral   Take 1 tablet (1,000 mg total) by mouth 2 (two) times daily.   180 tablet   1   . glucose blood (ACCU-CHEK SMARTVIEW) test strip  3 times daily before meals, 250.02, insulin requiring   100 each   4   . Insulin Syringes, Disposable, U-100 0.3 ML MISC      Use twice daily to inject insulin, 250.00, insulin requiring   100 each   4    BP 119/71  Pulse 97  Temp(Src) 98 F (36.7 C) (Oral)  Resp 18  Ht 5\' 3"  (1.6 m)  Wt 165 lb (74.844 kg)  BMI 29.24 kg/m2  SpO2 98% Physical Exam  Nursing note and vitals reviewed. Constitutional: She is oriented to person, place, and time. Vital signs are normal. She appears well-developed and well-nourished.  HENT:  Head: Normocephalic and atraumatic.  Mouth/Throat: Uvula is midline and oropharynx is clear and moist.  Cardiovascular: Normal rate, regular rhythm, normal heart sounds  and normal pulses.   Pulmonary/Chest: Effort normal and breath sounds normal. No respiratory distress.  Genitourinary: Vagina normal.     Two small fissures on left peritoneal area. Lesions are tender to touch.  Musculoskeletal:       Right shoulder: Normal.       Left shoulder: Normal.  Neurological: She is alert and oriented to person, place, and time.  Skin: Skin is warm and dry.     Multiple erythematous wheals over lower abdomen, inner thighs, and groin. Wheals are tender to touch.  Psychiatric: She has a normal mood and affect. Her behavior is normal. Judgment and thought content normal.    ED Course  Procedures (including critical care time) Labs Review Labs Reviewed  GLUCOSE, CAPILLARY - Abnormal; Notable for the following:    Glucose-Capillary 437 (*)    All other components within normal limits   patient has improved, with her blood sugar.  Patient be treated for pain associated with these bee stings.  Patient is advised followup with her primary care Dr. told to return here as needed  MDM      Carlyle Dolly, PA-C 08/10/13 1800

## 2013-08-10 NOTE — ED Notes (Signed)
Notified RN of CBG [id number] 

## 2013-08-10 NOTE — ED Notes (Signed)
Pt reports multiple yellow jacket stings two days ago to left thigh and leg. Having pain and swelling to left thigh, groin, vagina and lower abd. Reports "just not feeling well" and therefore also hasnt been taking any of her daily meds. Airway intact, no acute distress noted at triage.

## 2013-08-10 NOTE — ED Notes (Signed)
Pt made aware of cbg reading, pt stated she did not taking her morning insulin dose and proceeded to draw up 17 units 70/30 Novolog and administered them to herself. MD to be made aware.

## 2013-08-10 NOTE — ED Notes (Signed)
Pt states she is having aches all over her body.

## 2013-08-16 ENCOUNTER — Telehealth: Payer: Self-pay | Admitting: *Deleted

## 2013-08-16 ENCOUNTER — Ambulatory Visit (INDEPENDENT_AMBULATORY_CARE_PROVIDER_SITE_OTHER): Payer: Medicaid Other | Admitting: Dietician

## 2013-08-16 ENCOUNTER — Encounter: Payer: Self-pay | Admitting: Dietician

## 2013-08-16 ENCOUNTER — Other Ambulatory Visit: Payer: Self-pay | Admitting: Internal Medicine

## 2013-08-16 ENCOUNTER — Other Ambulatory Visit: Payer: Self-pay | Admitting: Dietician

## 2013-08-16 VITALS — Wt 171.0 lb

## 2013-08-16 DIAGNOSIS — E119 Type 2 diabetes mellitus without complications: Secondary | ICD-10-CM

## 2013-08-16 DIAGNOSIS — Z23 Encounter for immunization: Secondary | ICD-10-CM

## 2013-08-16 LAB — POCT GLYCOSYLATED HEMOGLOBIN (HGB A1C): Hemoglobin A1C: 10.9

## 2013-08-16 MED ORDER — ACCU-CHEK NANO SMARTVIEW W/DEVICE KIT: 1.0000 | PACK | Freq: Two times a day (BID) | Status: DC

## 2013-08-16 NOTE — Patient Instructions (Signed)
Follow up in 6 months.   Bring meter to all visits.

## 2013-08-16 NOTE — Telephone Encounter (Signed)
Per donnap. Pt wants to discuss meds and refills called pt, left message, no answer

## 2013-08-16 NOTE — Progress Notes (Signed)
Diabetes Self-Management Education  Visit Number: Follow-up 1  08/16/2013 Linda Miller, identified by name and date of birth, is a 57 y.o. female with  .  Other people present during visit:      ASSESSMENT  Patient Concerns:     Weight 171 lb (77.565 kg). Body mass index is 30.3 kg/(m^2).  Lab Results: LDL Cholesterol  Date Value Range Status  10/16/2012 137* 0 - 99 mg/dL Final           Total Cholesterol/HDL Ratio:CHD Risk                            Coronary Heart Disease Risk Table                                            Men       Women              1/2 Average Risk              3.4        3.3                  Average Risk              5.0        4.4               2X Average Risk              9.6        7.1               3X Average Risk             23.4       11.0     Use the calculated Patient Ratio above and the CHD Risk table      to determine the patient's CHD Risk.     ATP III Classification (LDL):           < 100        mg/dL         Optimal          100 - 129     mg/dL         Near or Above Optimal          130 - 159     mg/dL         Borderline High          160 - 189     mg/dL         High           > 190        mg/dL         Very High           Hemoglobin A1C  Date Value Range Status  08/16/2013 10.9   Final  09/21/2012 15.6* <5.7 % Final     (NOTE)  Family History  Problem Relation Age of Onset  . Diabetes Mother   . Cancer Father    History  Substance Use Topics  . Smoking status: Current Some Day Smoker -- 0.20 packs/day    Types: Cigarettes  . Smokeless tobacco: Not on file     Comment: plans to quit by Christmas, down to 1 cigarette/week  . Alcohol Use: No    Support Systems:   mother Special Needs:    no Prior DM Education:  yes Daily Foot Exams:   daily Patient Belief / Attitude about Diabetes:   it can be controlled  Assessment comments: patient  loaned meter to her mother who lost it, has not checked blood sugars several weeks   Diet Recall: eats fruit, vegetables, few sweets, whole grains, lot's of vegetables    Individualized Plan for Diabetes Self-Management Training:  Patient individualized diabetes plan discussed today with patient and includes:  Nutrition, medications, monitoring, physical activity, how to handle highs and lows, Special care for my body when I have diabetes  Education Topics Reviewed with Patient Today:  Topic Points Discussed  Disease State   Nutrition Management    Physical Activity and Exercise Role of exercise on diabetes management, blood pressure control and cardiac health.;Identified with patient nutritional and/or medication changes necessary with exercise.  Medications    Monitoring    Acute Complications Taught treatment of hypoglycemia - the 15 rule.;Discussed and identified patients' treatment of hyperglycemia.  Chronic Complications Relationship between chronic complications and blood glucose control;Lipid levels, blood glucose control and heart disease;Dental care;Retinopathy and reason for yearly dilated eye exams;Rephropathy, what it is, prevention of, the use of ACE, ARB's and early detection of through urine microalbumia.;Reviewed with patient heart disease, higher risk of, and prevention  Psychosocial Adjustment    Goal Setting    Preconception Care (if applicable)      PATIENTS GOALS   Learning Objective(s):     Goal The patient agrees to:  Nutrition Follow meal plan discussed  Physical Activity    Medications take my medication as prescribed  Monitoring    Problem Solving    Reducing Risk New goal: Wants to decrease weight to 150# by cutting back on portions and greasy foods and exercsing  Health Coping     Patient Self-Evaluation of Goals (Subsequent Visits)  Goal The patient rates self as meeting goals (% of time)  Nutrition    Physical Activity >75%  Medications >75%   Monitoring    Problem Solving    Reducing Risk    Health Coping       PERSONALIZED PLAN / SUPPORT  Self-Management Support:  Doctor's office;Friends;Family;CDE visits ______________________________________________________________________  Outcomes  Expected Outcomes:  Demonstrated interest in learning. Expect positive outcomes Self-Care Barriers:  Lack of transportation;Lack of material resources Education material provided: yes If problems or questions, patient to contact team via:  Phone Time in: 1430     Time out: 1530 Future DSME appointment: -     Linda Miller, [name] 08/16/2013 5:06 PM

## 2013-09-07 ENCOUNTER — Emergency Department (HOSPITAL_COMMUNITY)
Admission: EM | Admit: 2013-09-07 | Discharge: 2013-09-07 | Disposition: A | Discharge status: Home / Self Care | Payer: Medicaid Other | Attending: Emergency Medicine | Admitting: Emergency Medicine

## 2013-09-07 ENCOUNTER — Encounter (HOSPITAL_COMMUNITY): Payer: Self-pay | Admitting: Emergency Medicine

## 2013-09-07 ENCOUNTER — Emergency Department (HOSPITAL_COMMUNITY): Payer: Medicaid Other

## 2013-09-07 DIAGNOSIS — N390 Urinary tract infection, site not specified: Secondary | ICD-10-CM | POA: Insufficient documentation

## 2013-09-07 DIAGNOSIS — R109 Unspecified abdominal pain: Secondary | ICD-10-CM | POA: Insufficient documentation

## 2013-09-07 DIAGNOSIS — E119 Type 2 diabetes mellitus without complications: Secondary | ICD-10-CM | POA: Insufficient documentation

## 2013-09-07 DIAGNOSIS — Z79899 Other long term (current) drug therapy: Secondary | ICD-10-CM | POA: Insufficient documentation

## 2013-09-07 DIAGNOSIS — IMO0001 Reserved for inherently not codable concepts without codable children: Secondary | ICD-10-CM | POA: Insufficient documentation

## 2013-09-07 DIAGNOSIS — R509 Fever, unspecified: Secondary | ICD-10-CM | POA: Insufficient documentation

## 2013-09-07 DIAGNOSIS — I10 Essential (primary) hypertension: Secondary | ICD-10-CM | POA: Insufficient documentation

## 2013-09-07 DIAGNOSIS — E7800 Pure hypercholesterolemia, unspecified: Secondary | ICD-10-CM | POA: Insufficient documentation

## 2013-09-07 DIAGNOSIS — J069 Acute upper respiratory infection, unspecified: Secondary | ICD-10-CM | POA: Insufficient documentation

## 2013-09-07 DIAGNOSIS — Z794 Long term (current) use of insulin: Secondary | ICD-10-CM | POA: Insufficient documentation

## 2013-09-07 DIAGNOSIS — R350 Frequency of micturition: Secondary | ICD-10-CM | POA: Insufficient documentation

## 2013-09-07 DIAGNOSIS — F17200 Nicotine dependence, unspecified, uncomplicated: Secondary | ICD-10-CM | POA: Insufficient documentation

## 2013-09-07 LAB — BASIC METABOLIC PANEL
BUN: 17 mg/dL (ref 6–23)
Calcium: 8.5 mg/dL (ref 8.4–10.5)
Creatinine, Ser: 1.34 mg/dL — ABNORMAL HIGH (ref 0.50–1.10)
GFR calc non Af Amer: 44 mL/min — ABNORMAL LOW (ref >90)
Glucose, Bld: 220 mg/dL — ABNORMAL HIGH (ref 70–99)
Sodium: 133 mEq/L — ABNORMAL LOW (ref 135–145)

## 2013-09-07 LAB — CBC WITH DIFFERENTIAL/PLATELET
Basophils Relative: 0 % (ref 0–1)
Eosinophils Absolute: 0 10*3/uL (ref 0.0–0.7)
Hemoglobin: 11.4 g/dL — ABNORMAL LOW (ref 12.0–15.0)
MCH: 25.7 pg — ABNORMAL LOW (ref 26.0–34.0)
MCHC: 33.6 g/dL (ref 30.0–36.0)
Monocytes Absolute: 2.5 10*3/uL — ABNORMAL HIGH (ref 0.1–1.0)
Neutro Abs: 16.5 10*3/uL — ABNORMAL HIGH (ref 1.7–7.7)
Neutrophils Relative %: 80 % — ABNORMAL HIGH (ref 43–77)

## 2013-09-07 LAB — URINALYSIS, ROUTINE W REFLEX MICROSCOPIC
Glucose, UA: 250 mg/dL — AB
Ketones, ur: 15 mg/dL — AB
Protein, ur: >300 mg/dL — AB

## 2013-09-07 LAB — URINE MICROSCOPIC-ADD ON

## 2013-09-07 MED ORDER — CEPHALEXIN 500 MG PO CAPS: 500.0000 mg | ORAL_CAPSULE | Freq: Four times a day (QID) | ORAL | Status: DC

## 2013-09-07 MED ORDER — POTASSIUM CHLORIDE CRYS ER 20 MEQ PO TBCR
40.0000 meq | EXTENDED_RELEASE_TABLET | Freq: Once | ORAL | Status: AC
  Administered 2013-09-07: 40 meq via ORAL
  Filled 2013-09-07: qty 2

## 2013-09-07 MED ORDER — ALBUTEROL SULFATE HFA 108 (90 BASE) MCG/ACT IN AERS
2.0000 | INHALATION_SPRAY | Freq: Once | RESPIRATORY_TRACT | Status: AC
  Administered 2013-09-07: 2 via RESPIRATORY_TRACT
  Filled 2013-09-07: qty 6.7

## 2013-09-07 MED ORDER — BENZONATATE 100 MG PO CAPS: 100.0000 mg | ORAL_CAPSULE | Freq: Three times a day (TID) | ORAL | Status: DC

## 2013-09-07 MED ORDER — ONDANSETRON HCL 4 MG/2ML IJ SOLN
4.0000 mg | Freq: Once | INTRAMUSCULAR | Status: AC
  Administered 2013-09-07: 4 mg via INTRAVENOUS
  Filled 2013-09-07: qty 2

## 2013-09-07 MED ORDER — DEXTROSE 5 % IV SOLN
1.0000 g | Freq: Once | INTRAVENOUS | Status: AC
  Administered 2013-09-07: 1 g via INTRAVENOUS
  Filled 2013-09-07: qty 10

## 2013-09-07 MED ORDER — SODIUM CHLORIDE 0.9 % IV BOLUS (SEPSIS)
1000.0000 mL | Freq: Once | INTRAVENOUS | Status: AC
  Administered 2013-09-07: 1000 mL via INTRAVENOUS

## 2013-09-07 MED ORDER — HYDROCODONE-ACETAMINOPHEN 5-325 MG PO TABS: 2.0000 | ORAL_TABLET | ORAL | Status: DC | PRN

## 2013-09-07 MED ORDER — BENZONATATE 200 MG PO CAPS: 200.0000 mg | ORAL_CAPSULE | Freq: Three times a day (TID) | ORAL | Status: DC | PRN

## 2013-09-07 NOTE — ED Provider Notes (Signed)
Medical screening examination/treatment/procedure(s) were performed by non-physician practitioner and as supervising physician I was immediately available for consultation/collaboration.  Flint Melter, MD 09/07/13 2136

## 2013-09-07 NOTE — ED Notes (Signed)
Pt discahrged home with all belongings, pt alert and ambulatory upon discharge, 3 new rx prescribed pt verbalizes understanding of discharge instructions, pt driven home by family

## 2013-09-07 NOTE — ED Provider Notes (Signed)
CSN: [id number]     Arrival date & time 09/07/13  1005 History   First MD Initiated Contact with Patient 09/07/13 1130     Chief Complaint  Patient presents with  . Cough  . Generalized Body Aches  . Urinary Frequency  . Hyperglycemia   (Consider location/radiation/quality/duration/timing/severity/associated sxs/prior Treatment) Patient is a 57 y.o. female presenting with cough, frequency, and hyperglycemia. The history is provided by the patient. No language interpreter was used.  Cough Cough characteristics:  Productive Sputum characteristics:  White Severity:  Mild Timing:  Intermittent Smoker: yes   Context: sick contacts and upper respiratory infection   Relieved by:  Nothing Associated symptoms: fever   Urinary Frequency This is a new problem. The problem has been gradually worsening. Associated symptoms include abdominal pain, coughing and a fever. Pertinent negatives include no nausea, vomiting or weakness.  Hyperglycemia Blood sugar level PTA:  289 Duration:  4 days Diabetes status:  Controlled with insulin Current diabetic therapy:  Novolog, metformin Associated symptoms: abdominal pain and fever   Associated symptoms: no nausea and no vomiting   Abdominal pain:    Location:  Suprapubic   Quality:  Aching   Severity:  Mild  Patient is a 57 year old female presenting today with cough, congestion and urinary frequency. She reports that she is a diabetic and her blood sugars have been running around 200. She reports that other members of her family have been sick with upper respiratory symptoms. She reports that she has had a productive cough and congestion for a few days and thinks that she may have had a low-grade fever. She denies shortness of breath, difficulty breathing or chest pain. She also reports urinary frequency, urgency and lower abdominal pressure. She denies radiation of pain, hematuria or nausea and vomiting. She reports that she feels like she may have a  UTI.    Past Medical History  Diagnosis Date  . Diabetes mellitus   . Hypertension   . High cholesterol    Past Surgical History  Procedure Laterality Date  . Abdominal hysterectomy    . Debridement  foot    . I&d extremity  09/23/2012    Procedure: IRRIGATION AND DEBRIDEMENT EXTREMITY;  Surgeon: Nadara Mustard, MD;  Location: [facility] OR;  Service: Orthopedics;  Laterality: Right;  . Tee without cardioversion  09/24/2012    Procedure: TRANSESOPHAGEAL ECHOCARDIOGRAM (TEE);  Surgeon: Laurey Morale, MD;  Location: [facility] ENDOSCOPY;  Service: Cardiovascular;  Laterality: N/A;   Family History  Problem Relation Age of Onset  . Diabetes Mother   . Cancer Father    History  Substance Use Topics  . Smoking status: Current Some Day Smoker -- 0.20 packs/day    Types: Cigarettes  . Smokeless tobacco: Not on file     Comment: plans to quit by Christmas, down to 1 cigarette/week  . Alcohol Use: No   OB History   Grav Para Term Preterm Abortions TAB SAB Ect Mult Living                 Review of Systems  Constitutional: Positive for fever.  Respiratory: Positive for cough.   Gastrointestinal: Positive for abdominal pain. Negative for nausea, vomiting and diarrhea.  Genitourinary: Positive for frequency. Negative for urgency, hematuria and decreased urine volume.  Neurological: Negative for weakness.  All other systems reviewed and are negative.    Allergies  Codeine and Tramadol  Home Medications   Current Outpatient Rx  Name  Route  Sig  Dispense  Refill  . Blood Glucose Monitoring Suppl (ACCU-CHEK NANO SMARTVIEW) W/DEVICE KIT   Does not apply   1 each by Does not apply route 2 (two) times daily.   1 kit   0   . glimepiride (AMARYL) 4 MG tablet   Oral   Take 1 tablet (4 mg total) by mouth daily before breakfast.   30 tablet   2   . glucose blood (ACCU-CHEK SMARTVIEW) test strip      3 times daily before meals, 250.02, insulin requiring   100 each   4   .  HYDROcodone-acetaminophen (NORCO/VICODIN) 5-325 MG per tablet   Oral   Take 1 tablet by mouth every 6 (six) hours as needed for pain.          Marland Kitchen insulin aspart protamine-insulin aspart (NOVOLOG MIX 70/30) (70-30) 100 UNIT/ML injection      Inject 17 units with breakfast & 13 units  with dinner each day   10 mL   4   . Insulin Syringes, Disposable, U-100 0.3 ML MISC      Use twice daily to inject insulin, 250.00, insulin requiring   100 each   4   . metFORMIN (GLUCOPHAGE) 1000 MG tablet   Oral   Take 1 tablet (1,000 mg total) by mouth 2 (two) times daily.   180 tablet   1    BP 114/71  Pulse 104  Temp(Src) 98.9 F (37.2 C) (Oral)  Resp 18  Ht 5\' 3"  (1.6 m)  Wt 169 lb (76.658 kg)  BMI 29.94 kg/m2  SpO2 97% Physical Exam  Nursing note and vitals reviewed. Constitutional: She is oriented to person, place, and time. She appears well-developed and well-nourished. No distress.  HENT:  Head: Normocephalic and atraumatic.  Mouth/Throat: Oropharynx is clear and moist.  Eyes: Pupils are equal, round, and reactive to light.  Neck: Normal range of motion. Neck supple.  Cardiovascular: Normal rate, regular rhythm, normal heart sounds and intact distal pulses.   Pulmonary/Chest: Effort normal and breath sounds normal.  Abdominal: Soft. Bowel sounds are normal. She exhibits no distension and no mass. There is no hepatosplenomegaly. There is tenderness in the suprapubic area. There is no rigidity, no rebound, no guarding, no CVA tenderness, no tenderness at McBurney's point and negative Murphy's sign.  Musculoskeletal: Normal range of motion.  Neurological: She is alert and oriented to person, place, and time.  Skin: Skin is warm and dry.    ED Course  Procedures (including critical care time) Labs Review Labs Reviewed  GLUCOSE, CAPILLARY - Abnormal; Notable for the following:    Glucose-Capillary 214 (*)    All other components within normal limits  URINALYSIS, ROUTINE W  REFLEX MICROSCOPIC  CBC WITH DIFFERENTIAL  BASIC METABOLIC PANEL   Imaging Review Dg Chest 2 View  09/07/2013   CLINICAL DATA:  Cough, body aches.  EXAM: CHEST  2 VIEW  COMPARISON:  01/25/2010  FINDINGS: The heart size and mediastinal contours are within normal limits. Both lungs are clear. The visualized skeletal structures are unremarkable.  IMPRESSION: No active cardiopulmonary disease.   Electronically Signed   By: Charlett Nose M.D.   On: 09/07/2013 11:20    EKG Interpretation   None       MDM   1. UTI (lower urinary tract infection)   2. URI (upper respiratory infection)     Urinary symptoms and lab work consistent with UTI; +nitrites and large leukocytes, turbid in appearance. WBC 20.6, K= 3.0. Klor con  po and Rocephin 1 gm IVPB given while here in ER. Pt reports feeling better after IV fluids and meds. Tessalon perrles and albuterol MDI, prn prescribed for URI symptoms and instructions on usage given.Keflex prescription for UTI. Pt demonstrates understanding and agrees to plan.         Irish Elders, NP 09/07/13 1626

## 2013-09-07 NOTE — ED Notes (Addendum)
Pt reporting cough, chills since last Thursday after receiving the flu vaccine. Also urinary frequency and dysuria. Reports body aches. Pt is a x 4. Also reports her blood sugars have been high since she hasn't been feeling well. Denies CP or SOB.

## 2013-09-07 NOTE — ED Notes (Signed)
Called for room once.

## 2013-09-09 LAB — URINE CULTURE: Colony Count: >=100000

## 2013-09-17 ENCOUNTER — Encounter (INDEPENDENT_AMBULATORY_CARE_PROVIDER_SITE_OTHER): Payer: Self-pay

## 2013-09-17 ENCOUNTER — Ambulatory Visit (HOSPITAL_BASED_OUTPATIENT_CLINIC_OR_DEPARTMENT_OTHER): Payer: Medicaid Other | Admitting: Physical Medicine & Rehabilitation

## 2013-09-17 ENCOUNTER — Encounter: Payer: Self-pay | Admitting: Physical Medicine & Rehabilitation

## 2013-09-17 ENCOUNTER — Encounter: Payer: Medicaid Other | Attending: Physical Medicine & Rehabilitation

## 2013-09-17 VITALS — BP 141/96 | HR 86 | Resp 14 | Ht 63.0 in | Wt 175.0 lb

## 2013-09-17 DIAGNOSIS — M79609 Pain in unspecified limb: Secondary | ICD-10-CM | POA: Insufficient documentation

## 2013-09-17 DIAGNOSIS — Y838 Other surgical procedures as the cause of abnormal reaction of the patient, or of later complication, without mention of misadventure at the time of the procedure: Secondary | ICD-10-CM | POA: Insufficient documentation

## 2013-09-17 DIAGNOSIS — G5741 Lesion of medial popliteal nerve, right lower limb: Secondary | ICD-10-CM

## 2013-09-17 DIAGNOSIS — M7660 Achilles tendinitis, unspecified leg: Secondary | ICD-10-CM | POA: Insufficient documentation

## 2013-09-17 DIAGNOSIS — M7661 Achilles tendinitis, right leg: Secondary | ICD-10-CM

## 2013-09-17 DIAGNOSIS — G8928 Other chronic postprocedural pain: Secondary | ICD-10-CM

## 2013-09-17 DIAGNOSIS — Z79899 Other long term (current) drug therapy: Secondary | ICD-10-CM

## 2013-09-17 DIAGNOSIS — Z5181 Encounter for therapeutic drug level monitoring: Secondary | ICD-10-CM

## 2013-09-17 DIAGNOSIS — G5740 Lesion of medial popliteal nerve, unspecified lower limb: Secondary | ICD-10-CM

## 2013-09-17 DIAGNOSIS — G894 Chronic pain syndrome: Secondary | ICD-10-CM

## 2013-09-17 MED ORDER — PREGABALIN 75 MG PO CAPS: 75.0000 mg | ORAL_CAPSULE | Freq: Two times a day (BID) | ORAL | Status: DC

## 2013-09-17 NOTE — Patient Instructions (Signed)
Diagnosis is nerve pain related to scar tissue Try nerve pain pills such as Lyrica. If you tolerate this medicine but do not have a good affect we may have to give you higher doses. We also discussed possibility of nerve blocks

## 2013-09-17 NOTE — Progress Notes (Signed)
Subjective:    Patient ID: Linda Miller, female    DOB: June 28, 1956, 57 y.o.   MRN: [id number]  HPI 57 year old female with diabetes mellitus type 2 developed a right heel abscess. 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 She underwent surgical debridement of the plantar soft tissue as well as partial calcaneal excision. She had postoperative IV antibiotics for several weeks. She is followed up with her orthopedic surgeon. She complains of intermittent severe right heel pain. She has a right foot orthosis for the last month. She cannot tell whether it has helped much so far. The patient has tried gabapentin for her pain but it made her nauseous. She gets partial relief with hydrocodone She has not had any type of nerve blocks. She is independent with all self-care and mobility but her ambulating distance is reduced secondary to right heel pain Pain Inventory Average Pain 9 Pain Right Now 10 My pain is constant, sharp and aching  In the last 24 hours, has pain interfered with the following? General activity 8 Relation with others 8 Enjoyment of life 8 What TIME of day is your pain at its worst? morning and night Sleep (in general) Poor  Pain is worse with: walking and standing Pain improves with: rest and medication Relief from Meds: 7  Mobility use a cane use a walker ability to climb steps?  no do you drive?  no  Function not employed: date last employed . I need assistance with the following:  household duties and shopping  Neuro/Psych trouble walking  Prior Studies Any changes since last visit?  no  Physicians involved in your care Any changes since last visit?  no   Family History  Problem Relation Age of Onset  . Diabetes Mother   . Cancer Father    History   Social History  . Marital Status: Legally Separated    Spouse Name: N/A    Number of Children: N/A  . Years of Education: 15   Social History Main Topics  . Smoking status: Current Some Day  Smoker -- 0.20 packs/day    Types: Cigarettes  . Smokeless tobacco: Never Used     Comment: plans to quit by Christmas, down to 1 cigarette/week  . Alcohol Use: No  . Drug Use: No  . Sexual Activity: None   Other Topics Concern  . None   Social History Narrative  . None   Past Surgical History  Procedure Laterality Date  . Abdominal hysterectomy    . Debridement  foot    . I&d extremity  09/23/2012    Procedure: IRRIGATION AND DEBRIDEMENT EXTREMITY;  Surgeon: Nadara Mustard, MD;  Location: [facility] OR;  Service: Orthopedics;  Laterality: Right;  . Tee without cardioversion  09/24/2012    Procedure: TRANSESOPHAGEAL ECHOCARDIOGRAM (TEE);  Surgeon: Laurey Morale, MD;  Location: [facility] ENDOSCOPY;  Service: Cardiovascular;  Laterality: N/A;   Past Medical History  Diagnosis Date  . Diabetes mellitus   . Hypertension   . High cholesterol    BP 141/96  Pulse 86  Resp 14  Ht 5\' 3"  (1.6 m)  Wt 175 lb (79.379 kg)  BMI 31.01 kg/m2  SpO2 99%   Review of Systems  Constitutional: Positive for diaphoresis.  Endocrine:       High blood sugar  Musculoskeletal: Positive for gait problem.  All other systems reviewed and are negative.       Objective:   Physical Exam  Nursing note and vitals reviewed. Constitutional: She is oriented to  person, place, and time. She appears well-developed and well-nourished.  HENT:  Head: Normocephalic and atraumatic.  Eyes: Conjunctivae and EOM are normal. Pupils are equal, round, and reactive to light.  Neurological: She is alert and oriented to person, place, and time.  Psychiatric: She has a normal mood and affect.   General no acute distress Right skin temperature medial malleolus 85.1 Left skin temperature medial malleolus 86.8 Positive Tinel sign behind the right medial malleolus Reduced sensation to pinprick on the medial plantar surface of the right foot Tenderness over the Achilles insertion on the right side Negative Thompson's  test Posterior heel pain with ankle range of motion No evidence of with swelling No evidence of erythema No evidence of drainage from the surgical scar     Assessment & Plan:  1. 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 There is no evidence of reflex sympathetic dystrophy. I do believe that this pain is related to irritation of the medial plantar nerve secondary to scarring. This pain appears to be neuropathic. I would recommend a trial of Lyrica If she does not tolerate Lyrica we may try nortriptyline If the pain is not adequately relieved by oral medications, may trial  nerve block at the Tarsal tunnel  2. Achilles tendinitis. She has been favoring the right foot. I have encouraged and instructed range of motion exercises. She would benefit from outpatient physical therapy however based on an insurance she would only get one or 2 visits  RTC 3 weeks I do not think we'll need to be using chronic narcotic analgesics

## 2013-09-21 ENCOUNTER — Other Ambulatory Visit: Payer: Self-pay | Admitting: Dietician

## 2013-09-21 DIAGNOSIS — E119 Type 2 diabetes mellitus without complications: Secondary | ICD-10-CM

## 2013-09-22 NOTE — Telephone Encounter (Signed)
Patient requests refill on diabetes supplies. She is out because she lost a bag with supplies in it. She still has meter.

## 2013-09-23 MED ORDER — GLUCOSE BLOOD VI STRP: ORAL_STRIP | Status: DC

## 2013-09-23 MED ORDER — "INSULIN SYRINGE-NEEDLE U-100 31G X 15/64"" 0.3 ML MISC": 1.0000 | Freq: Two times a day (BID) | Status: DC

## 2013-09-23 MED ORDER — ACCU-CHEK FASTCLIX LANCETS MISC: 1.0000 | Freq: Three times a day (TID) | Status: DC

## 2013-09-28 ENCOUNTER — Telehealth: Payer: Self-pay

## 2013-09-28 NOTE — Telephone Encounter (Signed)
Noted, place non narcotic in FYI

## 2013-09-28 NOTE — Telephone Encounter (Signed)
FYI:: Patient's UDS is Pos for Cocaine and Alcohol.

## 2013-09-28 NOTE — Telephone Encounter (Signed)
Done

## 2013-10-14 ENCOUNTER — Encounter: Payer: Self-pay | Admitting: Internal Medicine

## 2013-10-14 ENCOUNTER — Ambulatory Visit (INDEPENDENT_AMBULATORY_CARE_PROVIDER_SITE_OTHER): Payer: Medicaid Other | Admitting: Internal Medicine

## 2013-10-14 VITALS — BP 147/89 | HR 90 | Temp 98.3°F | Wt 178.1 lb

## 2013-10-14 DIAGNOSIS — Z1239 Encounter for other screening for malignant neoplasm of breast: Secondary | ICD-10-CM

## 2013-10-14 DIAGNOSIS — I10 Essential (primary) hypertension: Secondary | ICD-10-CM

## 2013-10-14 DIAGNOSIS — N390 Urinary tract infection, site not specified: Secondary | ICD-10-CM

## 2013-10-14 DIAGNOSIS — E785 Hyperlipidemia, unspecified: Secondary | ICD-10-CM

## 2013-10-14 DIAGNOSIS — N12 Tubulo-interstitial nephritis, not specified as acute or chronic: Secondary | ICD-10-CM

## 2013-10-14 DIAGNOSIS — E119 Type 2 diabetes mellitus without complications: Secondary | ICD-10-CM

## 2013-10-14 DIAGNOSIS — Z0000 Encounter for general adult medical examination without abnormal findings: Secondary | ICD-10-CM

## 2013-10-14 LAB — CBC WITH DIFFERENTIAL/PLATELET
Hemoglobin: 12.8 g/dL (ref 12.0–15.0)
Lymphocytes Relative: 22 % (ref 12–46)
Lymphs Abs: 2.4 10*3/uL (ref 0.7–4.0)
Neutrophils Relative %: 68 % (ref 43–77)
Platelets: 370 10*3/uL (ref 150–400)
RBC: 5 MIL/uL (ref 3.87–5.11)
WBC: 10.8 10*3/uL — ABNORMAL HIGH (ref 4.0–10.5)

## 2013-10-14 LAB — POCT URINALYSIS DIPSTICK
Glucose, UA: NEGATIVE
Spec Grav, UA: 1.015
Urobilinogen, UA: 0.2
pH, UA: 6

## 2013-10-14 MED ORDER — SULFAMETHOXAZOLE-TMP DS 800-160 MG PO TABS: 1.0000 | ORAL_TABLET | Freq: Two times a day (BID) | ORAL | Status: DC

## 2013-10-14 NOTE — Progress Notes (Signed)
Patient ID: Linda Miller, female   DOB: 15-Sep-1956, 57 y.o.   MRN: [id number]   Subjective:   Patient ID: Linda Miller female   DOB: 27-Mar-1956 57 y.o.   MRN: [id number]  HPI: [name] is a 57 y.o. with past medical history of HTN, insulin-dependent T2DM, and osteomyelitis s/p right heel replacement who presents for routine follow-up visit.   Pt with last HbA1c of 10.9 on 9/22 on metformin 1000 mg BID, glimepride 4 mg, and 70/30 Novolog (17U AM /13PM). She reports she checks her blood sugar x3 daily and brings her glucose meter today which reveals levels ranging from 47- 342 with average glucose 171.  She reports she has had a few episodes of symptomatic hypoglycemia (CBG 47, 49 56. 67) that resolved after eating. She reports she has at baseline polydipsia and polyuria. She believes she has gained a few pounds and tries to follow a diabetic diet and walks as much as she can (now more able to after right heel replacement). She has had recent URI and UTI last month. No foot infection. Her annual retinal exam on 08/25/13 revealed no diabetic retinopathy but mild hypertensive retinopathy.    Pt with recent URI prescribed benzonatate as need for cough and cephalexin 500 mg x4 daily for 7 days for UTI  by the ED on 10/14. She reports taking all of the pills she was prescribed.  At that time she was having burning with urination and urinary frequency with left sided flank and groin pain.  UA revealed large LE, + nitrite, moderate Hg, pyuria, and bacteruria with cultures revealing >100K E.Coli (pansensitive, indeterminate to ampicillin). Pt reports that her urinary burning and frequency have resolved but still has the left sided flank and groin pain for the past month. She denies cervical discharge, vaginal bleeding, or hematuria. She is sexually active and monogamous with her boyfriend. She has not had a pap smear in a very long time and requests to have one on next visit. She has had hysterectomy  without removal of her cervix.     Pt had right foot osteomyelitis requiring debridement and right heal replacement last October (2013). She was recently started on lyrica 75 mg BID which has helped with her pain and tingling (when barefoot). Pain is also relieved if she elevates her foot. She is now able to walk up and down stairs.     Pt not currently on anti-hypertensive medications. She does not check her blood pressure at home but says she would like to. She denies headache, blurry vision, palpitations, or chest pain.   Pt was previously on statin therapy but reports two previous medications were stopped due to muscle aches.   She states she smokes 1-2 cigarettes a day. 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      Past Medical History  Diagnosis Date  . Diabetes mellitus   . Hypertension   . High cholesterol    Current Outpatient Prescriptions  Medication Sig Dispense Refill  . ACCU-CHEK FASTCLIX LANCETS MISC 1 each by Does not apply route 3 (three) times daily before meals. Insulin requiring dx code 250.00  102 each  11  . benzonatate (TESSALON) 200 MG capsule Take 1 capsule (200 mg total) by mouth 3 (three) times daily as needed for cough.  20 capsule  0  . Blood Glucose Monitoring Suppl (ACCU-CHEK NANO SMARTVIEW) W/DEVICE KIT 1 each by Does not apply route 2 (two) times daily.  1 kit  0  . cephALEXin (  KEFLEX) 500 MG capsule Take 1 capsule (500 mg total) by mouth 4 (four) times daily.  28 capsule  0  . glimepiride (AMARYL) 4 MG tablet Take 1 tablet (4 mg total) by mouth daily before breakfast.  30 tablet  2  . glucose blood (ACCU-CHEK SMARTVIEW) test strip 3 times daily before meals, 250.02, insulin requiring  100 each  11  . HYDROcodone-acetaminophen (NORCO/VICODIN) 5-325 MG per tablet Take 2 tablets by mouth every 4 (four) hours as needed for pain.  6 tablet  0  . insulin aspart protamine-insulin aspart (NOVOLOG MIX 70/30) (70-30) 100 UNIT/ML injection Inject 17 units  with breakfast & 13 units  with dinner each day  10 mL  4  . Insulin Syringe-Needle U-100 31G X 15/64" 0.3 ML MISC 1 each by Does not apply route 2 (two) times daily. Use to inject insulin twice daily dx code 250.02  100 each  11  . Insulin Syringes, Disposable, U-100 0.3 ML MISC Use twice daily to inject insulin, 250.00, insulin requiring  100 each  4  . metFORMIN (GLUCOPHAGE) 1000 MG tablet Take 1 tablet (1,000 mg total) by mouth 2 (two) times daily.  180 tablet  1  . pregabalin (LYRICA) 75 MG capsule Take 1 capsule (75 mg total) by mouth 2 (two) times daily.  60 capsule  0   No current facility-administered medications for this visit.   Family History  Problem Relation Age of Onset  . Diabetes Mother   . Cancer Father    History   Social History  . Marital Status: Legally Separated    Spouse Name: N/A    Number of Children: N/A  . Years of Education: 62   Social History Main Topics  . Smoking status: Current Some Day Smoker -- 0.20 packs/day    Types: Cigarettes  . Smokeless tobacco: Never Used     Comment: plans to quit by Christmas, down to 1 cigarette/week  . Alcohol Use: No  . Drug Use: No  . Sexual Activity: None   Other Topics Concern  . None   Social History Narrative  . None   Review of Systems: Review of Systems  Constitutional: Positive for weight loss (weight gain) and malaise/fatigue. Negative for fever, chills and diaphoresis.  HENT: Positive for ear pain (right sided that resolved ). Negative for congestion and sore throat.   Eyes: Negative for blurred vision.  Respiratory: Positive for cough (dry for past 2 days). Negative for sputum production and shortness of breath.   Cardiovascular: Negative for chest pain, palpitations and leg swelling.  Gastrointestinal: Positive for abdominal pain (left sided groin pain for past month). Negative for nausea, vomiting, diarrhea, constipation, blood in stool and melena.  Genitourinary: Positive for frequency (at  baseline) and flank pain (left sided for past month). Negative for dysuria and urgency.  Musculoskeletal: Negative for back pain, falls, joint pain and myalgias.  Neurological: Positive for tingling (in right foot improved with lyrica). Negative for dizziness, weakness and headaches.  Endo/Heme/Allergies: Positive for polydipsia (at baseline).     Objective:  Physical Exam: Filed Vitals:   10/14/13 1441  BP: 147/89  Pulse: 90  Temp: 98.3 F (36.8 C)  TempSrc: Oral  Weight: 178 lb 1.6 oz (80.786 kg)  SpO2: 100%   Physical Exam  Constitutional: She is oriented to person, place, and time. She appears well-developed and well-nourished. No distress.  HENT:  Head: Normocephalic and atraumatic.  Right Ear: External ear normal.  Left Ear: External ear  normal.  Nose: Nose normal.  Mouth/Throat: Oropharynx is clear and moist. No oropharyngeal exudate.  Eyes: Conjunctivae and EOM are normal. Pupils are equal, round, and reactive to light. Right eye exhibits no discharge. Left eye exhibits no discharge. No scleral icterus.  Neck: Normal range of motion. Neck supple.  Cardiovascular: Normal rate, regular rhythm and normal heart sounds.   Pulmonary/Chest: Breath sounds normal. No respiratory distress. She has no wheezes. She has no rales. She exhibits no tenderness.  Abdominal: Soft. Bowel sounds are normal. She exhibits no distension. There is no tenderness. There is no rebound and no guarding.  Genitourinary:  Left sided CVA tenderness  Musculoskeletal: Normal range of motion. She exhibits no edema and no tenderness.  Mild right foot tenderness to palpation near heel, well-healed scar, pulse intact with normal perfusion   Neurological: She is alert and oriented to person, place, and time. No cranial nerve deficit. She exhibits normal muscle tone. Coordination normal.  Skin: Skin is warm and dry. No rash noted. She is not diaphoretic. No erythema. No pallor.  Psychiatric: She has a normal  mood and affect. Her behavior is normal. Judgment and thought content normal.    Assessment & Plan:   Please see problem-list for problem based assessment and plan

## 2013-10-14 NOTE — Patient Instructions (Signed)
-  Please take bactrim twice a day for 10 days for urinary tract infection  -Please check you blood pressure and blood sugars at home -We will check your cholesterol and other lab work today -Will see you back in 1 month for pap smear -Will refer you to GI for colonoscopy  -Nice meeting you!

## 2013-10-15 LAB — COMPLETE METABOLIC PANEL WITH GFR
ALT: <8 U/L (ref 0–35)
Albumin: 3.9 g/dL (ref 3.5–5.2)
BUN: 13 mg/dL (ref 6–23)
CO2: 27 mEq/L (ref 19–32)
Calcium: 9.5 mg/dL (ref 8.4–10.5)
Chloride: 104 mEq/L (ref 96–112)
GFR, Est African American: 88 mL/min
GFR, Est Non African American: 76 mL/min
Glucose, Bld: 104 mg/dL — ABNORMAL HIGH (ref 70–99)
Potassium: 4.5 mEq/L (ref 3.5–5.3)
Sodium: 139 mEq/L (ref 135–145)
Total Protein: 7 g/dL (ref 6.0–8.3)

## 2013-10-15 LAB — LIPID PANEL
Cholesterol: 255 mg/dL — ABNORMAL HIGH (ref 0–200)
HDL: 66 mg/dL (ref >39)
LDL Cholesterol: 170 mg/dL — ABNORMAL HIGH (ref 0–99)
Total CHOL/HDL Ratio: 3.9 Ratio
Triglycerides: 95 mg/dL (ref <150)

## 2013-10-16 LAB — URINE CULTURE: Colony Count: >=100000

## 2013-10-17 DIAGNOSIS — Z0000 Encounter for general adult medical examination without abnormal findings: Secondary | ICD-10-CM | POA: Insufficient documentation

## 2013-10-17 DIAGNOSIS — N12 Tubulo-interstitial nephritis, not specified as acute or chronic: Secondary | ICD-10-CM | POA: Insufficient documentation

## 2013-10-17 NOTE — Assessment & Plan Note (Addendum)
Assessment: Pt with history of hypercholesteremia and elevated LDL cholesterol on last lipid panel 10/16/12 who is not currently on statin therapy due to intolerance in past (myopathy with pravastatin and rosuvastatin).   Plan: -Obtain lipid panel --> total cholesterol (255), LDL (170) --> worsened from 1 year ago, consider trial with alternative statin

## 2013-10-17 NOTE — Assessment & Plan Note (Addendum)
Assessment:  Pt with uncontrolled DM with last HbA1c of 10.9 on 9/22 on metformin 1000 mg BID, glimepride 4 mg, and 70/30 Novolog (17U AM /13PM), with average glucose 171 in past 2 months and few symptomatic  hypoglycemic episodes that resolved. She reports she has at baseline polydipsia and polyuria and recent URI and UTI (pyelonpehiritis). No foot infection. Her annual retinal exam on 08/25/13 revealed no diabetic retinopathy but mild hypertensive retinopathy.     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

## 2013-10-17 NOTE — Assessment & Plan Note (Addendum)
Assessment: Pt with urinary burning and frequency with left sided flank and groin pain with evidence of pansensitive E.Coli  UTI  and leukocytolysis (20.8K) with neutrophilia on 09/07/13 ED visit who is status post cephphalexin 500 mg x4 daily for 7 days. Pt reports that her urinary burning and frequency have resolved but still has left sided flank  (CVA tenderness on exam) and groin pain for the past month most likely due to pyelonephritis. No fever, chills, rigors, nausea, vomiting, or prior history of kidney stones.    Plan: -Obtain POCT UA --> cloudy, nitrite (+),  LE (+), hematuria -Obtain urine culture---> >100K E.Coli pansensitive  -Obtain CBC w/diff ---> Leukocytosis improved 10.8K  -Presribe Bactrim DS BID for 10 days -Will return in 1 month --if unresolved, will obtain renal US

## 2013-10-18 ENCOUNTER — Encounter: Payer: Self-pay | Admitting: Internal Medicine

## 2013-10-18 NOTE — Progress Notes (Signed)
I saw and evaluated the patient.  I personally confirmed the key portions of the history and exam documented by Dr. Rabbani and I reviewed pertinent patient test results.  The assessment, diagnosis, and plan were formulated together and I agree with the documentation in the resident's note.  

## 2013-10-18 NOTE — Addendum Note (Signed)
Addended by: Blanch Media A on: 10/18/2013 01:47 PM   Modules accepted: Level of Service

## 2013-10-18 NOTE — Assessment & Plan Note (Signed)
Assessment:  Pt not currently on anti-hypertensives and not at diabetic goal of >130/80 with evidence of mild hypertensive retinopathy on 08/25/13 retinal exam. BP is 147/89 on current visit.   Plan:  -Pt to monitor BP at home   -Consider initiating ACEi , 1st line therapy and protective in setting of mild CKD stage 2 -Continue to monitor

## 2013-10-18 NOTE — Assessment & Plan Note (Signed)
Cervical cancer screening - pt would like Pap smear at next visit (s/p hysterectomy but reports cervix not taken out)  Breast cancer screening- pt deferred mammography at this time  Colon cancer screening- pt referred for colonoscopy

## 2013-10-25 ENCOUNTER — Ambulatory Visit: Payer: Medicaid Other | Admitting: Physical Medicine & Rehabilitation

## 2013-10-25 ENCOUNTER — Telehealth: Payer: Self-pay

## 2013-10-25 NOTE — Telephone Encounter (Signed)
May refill Lyrica 75mg  po BID #60, 2 refills

## 2013-10-25 NOTE — Telephone Encounter (Signed)
Patient is requesting a refill on Lyrica.

## 2013-10-26 ENCOUNTER — Other Ambulatory Visit: Payer: Self-pay

## 2013-10-26 MED ORDER — PREGABALIN 75 MG PO CAPS: 75.0000 mg | ORAL_CAPSULE | Freq: Two times a day (BID) | ORAL | Status: DC

## 2013-10-26 NOTE — Telephone Encounter (Signed)
Refill called in to [facility]. Patient is aware.

## 2013-10-26 NOTE — Telephone Encounter (Signed)
Lyrica refill called in to pharmacy. Attempted to contacted patient to inform her that prescription had been called in, but there was no answer and no voicemail.

## 2013-10-27 ENCOUNTER — Other Ambulatory Visit: Payer: Self-pay | Admitting: *Deleted

## 2013-10-27 DIAGNOSIS — E119 Type 2 diabetes mellitus without complications: Secondary | ICD-10-CM

## 2013-10-27 MED ORDER — GLIMEPIRIDE 4 MG PO TABS: 4.0000 mg | ORAL_TABLET | Freq: Every day | ORAL | Status: DC

## 2013-10-28 ENCOUNTER — Telehealth: Payer: Self-pay | Admitting: *Deleted

## 2013-10-28 NOTE — Telephone Encounter (Signed)
Call from pt - requesting a refill on Bactrim DS; states continues to have some itching and burning but not as bad. Requesting a refill. Thanks

## 2013-10-29 ENCOUNTER — Other Ambulatory Visit: Payer: Self-pay | Admitting: Internal Medicine

## 2013-10-29 DIAGNOSIS — N12 Tubulo-interstitial nephritis, not specified as acute or chronic: Secondary | ICD-10-CM

## 2013-10-29 MED ORDER — SULFAMETHOXAZOLE-TMP DS 800-160 MG PO TABS: 1.0000 | ORAL_TABLET | Freq: Two times a day (BID) | ORAL | Status: DC

## 2013-10-29 MED ORDER — FLUCONAZOLE 150 MG PO TABS: 150.0000 mg | ORAL_TABLET | Freq: Every day | ORAL | Status: DC

## 2013-10-29 NOTE — Progress Notes (Signed)
Called the patient and refilled her bactrim for 4 for more (1DS BID) and ordered 1 time dose of 150 mg fluconazole for yeast infection. Pt to be seen next week for follow-up and pap smear.

## 2013-10-29 NOTE — Telephone Encounter (Signed)
Refilled for 4 more days (1 DS BID) for total course of 14 days for pyelonephritis. Thanks!  Dr. Johna Roles

## 2013-11-02 ENCOUNTER — Encounter: Payer: Self-pay | Admitting: Internal Medicine

## 2013-11-04 ENCOUNTER — Telehealth: Payer: Self-pay

## 2013-11-04 ENCOUNTER — Ambulatory Visit: Payer: Medicaid Other | Admitting: Internal Medicine

## 2013-11-04 NOTE — Telephone Encounter (Addendum)
Patient is requesting a refill on Lyrica. Refill was called into pharmacy on 12/2. Called pharmacy and handled it. Patient is aware.

## 2013-11-05 ENCOUNTER — Ambulatory Visit: Payer: Medicaid Other | Admitting: Physical Medicine & Rehabilitation

## 2013-12-10 ENCOUNTER — Encounter: Payer: Self-pay | Admitting: Internal Medicine

## 2013-12-10 ENCOUNTER — Ambulatory Visit: Payer: Medicaid Other | Admitting: Internal Medicine

## 2013-12-13 ENCOUNTER — Ambulatory Visit: Payer: Medicaid Other | Admitting: Physical Medicine & Rehabilitation

## 2013-12-13 ENCOUNTER — Encounter: Payer: Medicaid Other | Attending: Physical Medicine & Rehabilitation

## 2013-12-20 ENCOUNTER — Telehealth: Payer: Self-pay | Admitting: *Deleted

## 2013-12-20 NOTE — Telephone Encounter (Signed)
Needs refill and appt rescheduled.  Did not elaborate.

## 2013-12-22 NOTE — Telephone Encounter (Signed)
Left message for patient to call office regarding her appointment and medication request.

## 2013-12-23 NOTE — Addendum Note (Signed)
Addended by: Neomia DearPOWERS, Jaculin Rasmus E on: 12/23/2013 05:59 PM   Modules accepted: Orders

## 2013-12-28 ENCOUNTER — Telehealth: Payer: Self-pay

## 2013-12-28 NOTE — Telephone Encounter (Signed)
error 

## 2013-12-28 NOTE — Telephone Encounter (Signed)
Patient called requesting a refill on Lyrica. Lyrica was filled on 12/2 with 2 add'l refills. I called [facility] and they said the patient transfered the RX to [facility] on 12/12. I then contacted [facility] @ [phone] to see if patient still had a refill remaining, and she does have 1 refill. Attempted to contact patient to inform her, there was no answer, so I left a voicemail for her to return call to clinic.

## 2013-12-30 ENCOUNTER — Other Ambulatory Visit (HOSPITAL_COMMUNITY)
Admission: RE | Admit: 2013-12-30 | Discharge: 2013-12-30 | Disposition: A | Discharge status: Home / Self Care | Payer: Medicaid Other | Source: Ambulatory Visit | Attending: Internal Medicine | Admitting: Internal Medicine

## 2013-12-30 ENCOUNTER — Encounter: Payer: Self-pay | Admitting: Internal Medicine

## 2013-12-30 ENCOUNTER — Ambulatory Visit (INDEPENDENT_AMBULATORY_CARE_PROVIDER_SITE_OTHER): Payer: Medicaid Other | Admitting: Internal Medicine

## 2013-12-30 VITALS — BP 135/82 | HR 89 | Temp 97.5°F | Wt 185.4 lb

## 2013-12-30 DIAGNOSIS — L293 Anogenital pruritus, unspecified: Secondary | ICD-10-CM

## 2013-12-30 DIAGNOSIS — R109 Unspecified abdominal pain: Secondary | ICD-10-CM

## 2013-12-30 DIAGNOSIS — N12 Tubulo-interstitial nephritis, not specified as acute or chronic: Secondary | ICD-10-CM

## 2013-12-30 DIAGNOSIS — I10 Essential (primary) hypertension: Secondary | ICD-10-CM

## 2013-12-30 DIAGNOSIS — E119 Type 2 diabetes mellitus without complications: Secondary | ICD-10-CM

## 2013-12-30 DIAGNOSIS — R102 Pelvic and perineal pain: Secondary | ICD-10-CM | POA: Insufficient documentation

## 2013-12-30 DIAGNOSIS — N760 Acute vaginitis: Secondary | ICD-10-CM | POA: Insufficient documentation

## 2013-12-30 DIAGNOSIS — Z01419 Encounter for gynecological examination (general) (routine) without abnormal findings: Secondary | ICD-10-CM | POA: Insufficient documentation

## 2013-12-30 DIAGNOSIS — R1024 Suprapubic pain: Secondary | ICD-10-CM | POA: Insufficient documentation

## 2013-12-30 DIAGNOSIS — N898 Other specified noninflammatory disorders of vagina: Secondary | ICD-10-CM | POA: Insufficient documentation

## 2013-12-30 DIAGNOSIS — Z1151 Encounter for screening for human papillomavirus (HPV): Secondary | ICD-10-CM | POA: Insufficient documentation

## 2013-12-30 DIAGNOSIS — Z113 Encounter for screening for infections with a predominantly sexual mode of transmission: Secondary | ICD-10-CM | POA: Insufficient documentation

## 2013-12-30 LAB — POCT GLYCOSYLATED HEMOGLOBIN (HGB A1C): HEMOGLOBIN A1C: 7.4

## 2013-12-30 LAB — GLUCOSE, CAPILLARY: Glucose-Capillary: 156 mg/dL — ABNORMAL HIGH (ref 70–99)

## 2013-12-30 MED ORDER — GLUCOSE BLOOD VI STRP: ORAL_STRIP | Status: DC

## 2013-12-30 MED ORDER — "INSULIN SYRINGE-NEEDLE U-100 31G X 15/64"" 0.3 ML MISC": 1.0000 | Freq: Two times a day (BID) | Status: DC

## 2013-12-30 MED ORDER — METFORMIN HCL 1000 MG PO TABS: 1000.0000 mg | ORAL_TABLET | Freq: Two times a day (BID) | ORAL | Status: DC

## 2013-12-30 MED ORDER — INSULIN SYRINGES (DISPOSABLE) U-100 0.3 ML MISC: Status: DC

## 2013-12-30 MED ORDER — FLUCONAZOLE 100 MG PO TABS: 150.0000 mg | ORAL_TABLET | Freq: Once | ORAL | Status: DC

## 2013-12-30 MED ORDER — PREGABALIN 75 MG PO CAPS: 75.0000 mg | ORAL_CAPSULE | Freq: Two times a day (BID) | ORAL | Status: DC

## 2013-12-30 MED ORDER — GLIMEPIRIDE 4 MG PO TABS: 4.0000 mg | ORAL_TABLET | Freq: Every day | ORAL | Status: DC

## 2013-12-30 NOTE — Assessment & Plan Note (Signed)
Lab Results  Component Value Date   HGBA1C 7.4 12/30/2013   HGBA1C 10.9 08/16/2013   HGBA1C 7.0 12/29/2012     Assessment: Diabetes control: fair control Progress toward A1C goal:  improved Comments: A1c reveals patient's CBG control has improved significantly since her last A1c. She is compliant with her medications.  Plan: Medications:  continue current medications Home glucose monitoring: Frequency: 2 times a day Timing:   Instruction/counseling given: discussed foot care Educational resources provided:   Self management tools provided:   Other plans:  Continue metformin 1000mg  BID, glimepiride 4mg  daily, and novolog 70/30 17U every AM and 13U every PM. Patient will need formal foot evaluation at her next visit. Since this was an acute visit and other procedures were performed we were not able to perform this health maintenance exam, though I did discuss foot care with the patient given her prior hx of foot infection. A1c was checked today as above.

## 2013-12-30 NOTE — Patient Instructions (Addendum)
Thank you for your visit today. It was a pleasure meeting you.  Today I performed a pelvic exam. We are checking you for gonorrhea, chlamydia, trichomonas, gardnerella, yeast infections. We are also checking your urine for infection. I will call you with these results.  I checked you A1c today, which was 7.4%. This is MUCH improved from your last A1c. Good work on controlling your blood sugar!  I prescribed fluconazole 150mg  for a ONE TIME dose. This will treat a yeast infection.   Please follow up with your PCP in 3 months OR call our clinic for an earlier appointment if your symptoms worsen or do not improve. Please make sure you are seen by a physician if you develop worsening abdominal pain, fevers, chills, nausea and vomiting.

## 2013-12-30 NOTE — Progress Notes (Signed)
Patient ID: Linda Miller, female   DOB: Jun 13, 1956, 58 y.o.   MRN: [id number] HPI The patient is a 58 y.o. female with a history of HTN, T2DM, and osteomyelitis s/p right heel replacement who presents for an acute visit.  Patient was diagnosed with pyelonephritis (UCx grew pansensitive E coli) in November 2014 that was treated with a 14 day course of Bactrim. Patient reports that her symptoms at the time (dysuria and abdominal pain) had resolved until approximately one month ago. Patient reports having recurrent lower abdominal pain and perhaps some intermittent burning with urination x 1 month. The abdominal pain radiates to her bilateral hips and low back. She denies having any nausea, vomiting, diarrhea, constipation, fever, chills, hematuria. She thinks she may be having increased urinary frequency. Lower abdominal pain and hip pain have not been relieved with Excedrin, Advil, Aleve, Tylenol OTC.  Patient also has history of intermittent vaginal itching and has been diagnosed with presumed candidiasis in the past. She was last treated for vaginal candidiasis with Diflucan 150 mg for one dose on 10/29/2013. Patient notes that her vaginal itching had resolved until about 2 weeks ago when she noticed she was having isolated vaginal itching, no vaginal discharge, vaginal bleeding, foul odor. She is sexually active with one partner for the past year. Partner is asymptomatic. Her last sexual encounter was approximately a month ago. Patient has been scratching herself so much that she thinks she might have torn her vaginal skin.  Of note, patient has history of poorly controlled diabetes mellitus. Her last A1c was 10.9% in September 2014. She takes metformin 1000 mg twice a day, glimepiride 4 mg daily, NovoLog Mix 70-30, 17 units in the morning and 13 units in evening. Patient checks CBGs a couple times per day and notes that recently her CBGs have ranged 80-130, though yesterday she had a read in the 380s.  She endorses having very infrequent hypoglycemic episodes where she becomes shaky. These episodes resolve easily with eating a snack. This does not happen often.   ROS: General: no fevers, chills, changes in weight, changes in appetite Skin: no rash HEENT: no blurry vision, hearing changes, sore throat Pulm: no dyspnea, coughing, wheezing CV: no chest pain, palpitations, shortness of breath Abd: see HPI GU: see HPI Ext: no arthralgias, myalgias Neuro: no weakness, numbness, or tingling  Filed Vitals:   12/30/13 1025  BP: 135/82  Pulse: 89  Temp: 97.5  SpO2 99% on room air  Physical Exam General: alert, cooperative, and in no apparent distress HEENT: pupils equal round and reactive to light, vision grossly intact, oropharynx clear and non-erythematous, MMM  Neck: supple Lungs: clear to ascultation bilaterally, normal work of respiration, no wheezes, rales, ronchi Heart: regular rate and rhythm, no murmurs, gallops, or rubs Abdomen: soft, overweight, TTP suprapubic region and bilateral lower quadrants; no CVAT, non-distended, normal bowel sounds GU: slightly atrophic and dry appearance of vulva, especially labia majora; atrophy of vaginal rugae present; cervical os is closed w/ some minimal purulent discharge and scant bleeding of cervix w/ manipulation using swabs; +CMT, bimanual exam was significant for severe pain diffusely throughout bilateral lower quadrants and suprapubic region Extremities: warm bilaterally, no pedal edema; there was some TTP over bilateral hip joints Neurologic: alert & oriented X3, cranial nerves II-XII grossly intact, strength grossly intact, sensation intact to light touch    Medication List       This list is accurate as of: 12/30/13 11:18 AM.  Always use your most recent med  list.               ACCU-CHEK FASTCLIX LANCETS Misc  1 each by Does not apply route 3 (three) times daily before meals. Insulin requiring dx code 250.00     ACCU-CHEK NANO  SMARTVIEW W/DEVICE Kit  1 each by Does not apply route 2 (two) times daily.              glimepiride 4 MG tablet  Commonly known as:  AMARYL  Take 1 tablet (4 mg total) by mouth daily before breakfast.     glucose blood test strip  Commonly known as:  ACCU-CHEK SMARTVIEW  3 times daily before meals, 250.02, insulin requiring     insulin aspart protamine- aspart (70-30) 100 UNIT/ML injection  Commonly known as:  NOVOLOG MIX 70/30  Inject 17 units with breakfast & 13 units  with dinner each day     Insulin Syringe-Needle U-100 31G X 15/64" 0.3 ML Misc  1 each by Does not apply route 2 (two) times daily. Use to inject insulin twice daily dx code 250.02     Insulin Syringes (Disposable) U-100 0.3 ML Misc  Use twice daily to inject insulin, 250.00, insulin requiring     metFORMIN 1000 MG tablet  Commonly known as:  GLUCOPHAGE  Take 1 tablet (1,000 mg total) by mouth 2 (two) times daily.     pregabalin 75 MG capsule  Commonly known as:  LYRICA  Take 1 capsule (75 mg total) by mouth 2 (two) times daily.        Assessment/Plan

## 2013-12-30 NOTE — Assessment & Plan Note (Signed)
Postmenopausal estrogen deficiency vs. Vaginal candidiasis. Patient has history of vaginal itching, which has been relieved by Diflucan 150 mg x1 dose. I will prescribe Diflucan 150 mg for 1 dose. If patient's vaginal itching does not resolve with this intervention, I believe topical estrogen should be considered given relative vulvar atrophy noted on my exam. She also may benefit from follow up with OB/GYN especially if the diagnosis of PID is confirmed.

## 2013-12-30 NOTE — Assessment & Plan Note (Signed)
I believe that patient's current lower abdominal pain may represent STD w/ likely PID. Patient is afebrile and does not have associated nausea or vomiting. She is not toxic appearing today. However, she had significant CMT on bimanual exam and her cervix had minimal purulent discharge and was slightly friable. While patient has h/o pyelonephritis, I do not believe this represents recurrence of UTI or pyelonephritis. I think it is possible that the burning on urination may be caused by exposure of urine on patient's raw skin (from scratching). She does not have CVAT and her pain is located only in her lower abdomen.  -UA -UCx -wet prep -GC/Chlamydia -PAP smear  I will plan to treat any infection if present as results return. I suspect that patient does have STD w/ likely PID. If this is the case, I will plan to treat with ceftriaxone 250mg  IM x 1 dose (patient amenable to coming into clinic to have this done) PLUS doxycycline 100mg  BID for 14 day course. At this point, I believe outpatient treatment is appropriate. I asked patient to return to clinic if she develops worsening abd pain, nausea/vomiting, fever/chills.    If these results are unrevealing, a CT scan of abd/pelvis and/or a renal ultrasound may be considered.

## 2013-12-31 ENCOUNTER — Telehealth: Payer: Self-pay | Admitting: Internal Medicine

## 2013-12-31 LAB — URINALYSIS, ROUTINE W REFLEX MICROSCOPIC
Bilirubin Urine: NEGATIVE
Glucose, UA: NEGATIVE mg/dL
Hgb urine dipstick: NEGATIVE
Ketones, ur: NEGATIVE mg/dL
NITRITE: NEGATIVE
PROTEIN: NEGATIVE mg/dL
Specific Gravity, Urine: 1.011 (ref 1.005–1.030)
UROBILINOGEN UA: 0.2 mg/dL (ref 0.0–1.0)
pH: 5 (ref 5.0–8.0)

## 2013-12-31 LAB — URINE CULTURE

## 2013-12-31 LAB — URINALYSIS, MICROSCOPIC ONLY
CRYSTALS: NONE SEEN
Casts: NONE SEEN
SQUAMOUS EPITHELIAL / LPF: NONE SEEN

## 2013-12-31 NOTE — Telephone Encounter (Signed)
I called patient and informed her of her negative wet prep and GC/Chlamydia testing. Her urine does have 21-50 WBC/HPF, few bacteria. I discussed with patient that her urine is not convincingly infected and I will wait for urine culture to result before making a decision regarding antibiotic therapy. Patient was also instructed to return to clinic if her symptoms do not improve within the next 1-2 weeks. Additionally, she will call our clinic immediately if she develops fever, chills, nausea, vomiting, worsening abd pain.

## 2014-01-03 ENCOUNTER — Telehealth: Payer: Self-pay | Admitting: Internal Medicine

## 2014-01-03 NOTE — Telephone Encounter (Signed)
I spoke with Linda Miller this morning regarding her urine culture results. I discussed with her that her urine did not grow out anything that needs treatment at this time. She is still having some lower abd pain. I asked her to return to clinic if this pain persists over the next week. I also asked her to schedule an appointment with our clinic sooner if she develops fever, chills, nausea, vomiting, worsening abd pain. Patient in good understanding.

## 2014-01-03 NOTE — Progress Notes (Signed)
Case discussed with Dr. Chikowski at the time of the visit.  We reviewed the resident's history and exam and pertinent patient test results.  I agree with the assessment, diagnosis, and plan of care documented in the resident's note. 

## 2014-01-06 ENCOUNTER — Encounter: Payer: Medicaid Other | Admitting: Internal Medicine

## 2014-01-11 ENCOUNTER — Telehealth: Payer: Self-pay | Admitting: Dietician

## 2014-01-11 NOTE — Telephone Encounter (Signed)
pt wants to decrease her weight, not happy it is going up. she thinks the Lyrica causing her weight gain. She plans to discuss this  with Dr. Bryson DamesKirstiens on 01/17/14. Amaryl and insulin may also be contributing to her weight gain. Appointment with CDE on 01/28/14 prior to seeing physician to discuss.

## 2014-01-17 ENCOUNTER — Ambulatory Visit: Payer: Medicaid Other | Admitting: Physical Medicine & Rehabilitation

## 2014-01-28 ENCOUNTER — Ambulatory Visit: Payer: Medicaid Other | Admitting: Internal Medicine

## 2014-01-28 ENCOUNTER — Encounter: Payer: Medicaid Other | Admitting: Internal Medicine

## 2014-01-28 ENCOUNTER — Encounter: Payer: Medicaid Other | Admitting: Dietician

## 2014-02-17 ENCOUNTER — Telehealth: Payer: Self-pay | Admitting: Physical Medicine & Rehabilitation
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
## 2014-02-17 NOTE — Telephone Encounter (Signed)
Tried to contact patient regarding her lyrica request.  Her number has been disconnected.  Per the computer she has a refill, by another provider.  Patient should call the pharmacy.

## 2014-02-18 ENCOUNTER — Ambulatory Visit: Payer: Medicaid Other | Admitting: Physical Medicine & Rehabilitation

## 2014-03-16 ENCOUNTER — Other Ambulatory Visit: Payer: Self-pay | Admitting: *Deleted

## 2014-03-16 DIAGNOSIS — E119 Type 2 diabetes mellitus without complications: Secondary | ICD-10-CM

## 2014-03-17 MED ORDER — INSULIN ASPART PROT & ASPART (70-30 MIX) 100 UNIT/ML ~~LOC~~ SUSP: SUBCUTANEOUS | Status: DC

## 2014-03-25 ENCOUNTER — Encounter: Payer: Medicaid Other | Admitting: Dietician

## 2014-03-25 ENCOUNTER — Encounter: Payer: Medicaid Other | Admitting: Internal Medicine

## 2014-03-28 ENCOUNTER — Ambulatory Visit: Payer: Medicaid Other | Admitting: Physical Medicine & Rehabilitation

## 2014-04-25 ENCOUNTER — Encounter: Payer: Medicaid Other | Admitting: Internal Medicine

## 2014-05-06 ENCOUNTER — Encounter: Payer: Self-pay | Admitting: Dietician

## 2014-05-10 ENCOUNTER — Other Ambulatory Visit: Payer: Self-pay | Admitting: Dietician

## 2014-05-10 DIAGNOSIS — E119 Type 2 diabetes mellitus without complications: Secondary | ICD-10-CM

## 2014-05-10 MED ORDER — PREGABALIN 75 MG PO CAPS: 75.0000 mg | ORAL_CAPSULE | Freq: Two times a day (BID) | ORAL | Status: DC

## 2014-05-10 MED ORDER — METFORMIN HCL 1000 MG PO TABS: 1000.0000 mg | ORAL_TABLET | Freq: Two times a day (BID) | ORAL | Status: DC

## 2014-05-10 NOTE — Telephone Encounter (Signed)
Lyrica rx called [facility].

## 2014-05-10 NOTE — Telephone Encounter (Signed)
Called in response to [email or web address] phone number:  [phone]. She says she has n;t been abel to make her appointments because her mother is ill and she has been her caretaker. Will call us  In July when she thinks she'll be able to come in for an appointment. In the mean time, asks for refills of all of her medications be sent to [facility]. Told her I'd route this note to triage and ask them to handle her refills.

## 2014-07-08 ENCOUNTER — Ambulatory Visit (INDEPENDENT_AMBULATORY_CARE_PROVIDER_SITE_OTHER): Payer: Medicaid Other | Admitting: Dietician

## 2014-07-08 ENCOUNTER — Encounter: Payer: Self-pay | Admitting: Internal Medicine

## 2014-07-08 ENCOUNTER — Ambulatory Visit (INDEPENDENT_AMBULATORY_CARE_PROVIDER_SITE_OTHER): Payer: Medicaid Other | Admitting: Internal Medicine

## 2014-07-08 ENCOUNTER — Encounter: Payer: Self-pay | Admitting: Dietician

## 2014-07-08 VITALS — Ht 63.5 in | Wt 186.2 lb

## 2014-07-08 VITALS — BP 148/87 | HR 88 | Temp 97.5°F | Ht 63.5 in | Wt 186.0 lb

## 2014-07-08 DIAGNOSIS — G609 Hereditary and idiopathic neuropathy, unspecified: Secondary | ICD-10-CM

## 2014-07-08 DIAGNOSIS — I10 Essential (primary) hypertension: Secondary | ICD-10-CM

## 2014-07-08 DIAGNOSIS — H35039 Hypertensive retinopathy, unspecified eye: Secondary | ICD-10-CM

## 2014-07-08 DIAGNOSIS — E119 Type 2 diabetes mellitus without complications: Secondary | ICD-10-CM

## 2014-07-08 DIAGNOSIS — E785 Hyperlipidemia, unspecified: Secondary | ICD-10-CM

## 2014-07-08 DIAGNOSIS — G629 Polyneuropathy, unspecified: Secondary | ICD-10-CM | POA: Insufficient documentation

## 2014-07-08 LAB — GLUCOSE, CAPILLARY
Glucose-Capillary: 107 mg/dL — ABNORMAL HIGH (ref 70–99)
Glucose-Capillary: 57 mg/dL — ABNORMAL LOW (ref 70–99)

## 2014-07-08 LAB — POCT GLYCOSYLATED HEMOGLOBIN (HGB A1C): Hemoglobin A1C: 8.3

## 2014-07-08 MED ORDER — GABAPENTIN (ONCE-DAILY) 300 MG PO TABS: 300.0000 mg | ORAL_TABLET | Freq: Every day | ORAL | Status: DC

## 2014-07-08 MED ORDER — INSULIN ASPART PROT & ASPART (70-30 MIX) 100 UNIT/ML ~~LOC~~ SUSP: SUBCUTANEOUS | Status: DC

## 2014-07-08 MED ORDER — LISINOPRIL 20 MG PO TABS: 20.0000 mg | ORAL_TABLET | Freq: Every day | ORAL | Status: DC

## 2014-07-08 MED ORDER — FLUVASTATIN SODIUM 40 MG PO CAPS: 40.0000 mg | ORAL_CAPSULE | Freq: Every day | ORAL | Status: DC

## 2014-07-08 NOTE — Patient Instructions (Signed)
Please make an appointment in 2 months for follow up and eye exam.

## 2014-07-08 NOTE — Assessment & Plan Note (Addendum)
Assessment: Pt with hypertension not currently on anti-hypertensive therapy who presents with blood pressure of 148/87.    Plan: -BP 148/87 not at goal <140/90 -Start lisinopril 20 mg daily and uptitrate at next visit  -Obtain BMP -Pt to return in 1 month

## 2014-07-08 NOTE — Assessment & Plan Note (Signed)
Assessment: Pt with last lipid panel on 10/14/13 with hypercholesteremia previously on statin therapy with 10-yr ASCVD risk of 20.1% and lifetime risk of 50% with recommendations to start moderate to high intensity statin therapy.   Plan:  -Start moderate intensity statin, fluvastatin 40 mg daily (less AE of myalgias)  -Last CMP on 10/08/14 normal -Monitor for myalgias

## 2014-07-08 NOTE — Assessment & Plan Note (Addendum)
Assessment: Pt with last A1c of 7.4 on 12/30/13 compliant with oral hypoglycemic and insulin therapy with persistent symptomatic hypoglycemia who presents with CBG of 57 and worsened A1c of 8.3.    Plan:  -CBG 57 improved to 107 after carbohydrate intake -A1 8.3 not at goal < 7, discontinue glimepiride 4 mg due to frequent hypogylcemia. Continue metformin 1000 mg BID. Upon consultation with Debera Lat, will increase Novolog mix 70/30 from 17 U AM/ 13 U PM to 20 U AM/ 15 U PM.  Pt to return in 1 month. -BP 148/87 not at goal <140/90, start lisinopril 20 mg daily -Obtain annual lipid panel, last LDL 170 not at goal <100, start fluvastatin 40 mg daily  -Perform annual foot exam today -Obtain annual urine microalbumin (last one on 03/12/13 was normal)  -Last annual eye exam on 08/25/13 with mild hypertensive retinopathy -BMI 32.46 not at goal <30, encourage weight loss -Pt met with Debera Lat today for diabetic counseling

## 2014-07-08 NOTE — Progress Notes (Signed)
Patient ID: Linda Miller, female   DOB: [birth year], 57 y.o.   MRN: [id number]    Subjective:   Patient ID: Linda Miller female   DOB: Mar 28, 1956 58 y.o.   MRN: [id number]  HPI: Linda Miller is a 58 y.o. with past medical history of HTN, hyperlipidemia, insulin-dependent T2DM, and osteomyelitis s/p right heel replacement who presents for routine follow-up visit.  Her last A1c was 7.4 on 12/30/13. She reports checking her blood sugar about three times daily and brought her glucose meter in today which reveals average glucose of 138-380 with average of 222. She reports symptomatic hypoglycemia which has been ongoing for the past few months. She reports blurry vision and polydipsia but denies polyphagia and polyuria. She has chronic peripheral neuropathic pain and has been on lyrica for the past few months which she has not done well with. She reports it increased her appetite, caused rash, itching, sweating, nightmares, and worsening pain. She consequently stopped taking it three days ago. She tries to follow a healthy diet and exercise regularly. She reports recent weight gain. She reports being under a lot of stress lately.    She is not currently on medication for hypertension or hyperlipidemia.     Past Medical History  Diagnosis Date  . Diabetes mellitus   . Hypertension   . High cholesterol   . Osteomyelitis of right foot    Current Outpatient Prescriptions  Medication Sig Dispense Refill  . ACCU-CHEK FASTCLIX LANCETS MISC 1 each by Does not apply route 3 (three) times daily before meals. Insulin requiring dx code 250.00  102 each  11  . Blood Glucose Monitoring Suppl (ACCU-CHEK NANO SMARTVIEW) W/DEVICE KIT 1 each by Does not apply route 2 (two) times daily.  1 kit  0  . fluconazole (DIFLUCAN) 100 MG tablet Take 1.5 tablets (150 mg total) by mouth once.  2 tablet  0  . glimepiride (AMARYL) 4 MG tablet Take 1 tablet (4 mg total) by mouth daily before breakfast.  90 tablet  4    . glucose blood (ACCU-CHEK SMARTVIEW) test strip 3 times daily before meals, 250.02, insulin requiring  100 each  11  . insulin aspart protamine- aspart (NOVOLOG MIX 70/30) (70-30) 100 UNIT/ML injection Inject 17 units with breakfast & 13 units  with dinner each day  10 mL  4  . Insulin Syringe-Needle U-100 31G X 15/64" 0.3 ML MISC 1 each by Does not apply route 2 (two) times daily. Use to inject insulin twice daily dx code 250.02  100 each  11  . Insulin Syringes, Disposable, U-100 0.3 ML MISC Use twice daily to inject insulin, 250.00, insulin requiring  100 each  4  . metFORMIN (GLUCOPHAGE) 1000 MG tablet Take 1 tablet (1,000 mg total) by mouth 2 (two) times daily.  180 tablet  2  . pregabalin (LYRICA) 75 MG capsule Take 1 capsule (75 mg total) by mouth 2 (two) times daily.  60 capsule  2   No current facility-administered medications for this visit.   Family History  Problem Relation Age of Onset  . Diabetes Mother   . Cancer Father    History   Social History  . Marital Status: Legally Separated    Spouse Name: N/A    Number of Children: N/A  . Years of Education: 14   Social History Main Topics  . Smoking status: Current Some Day Smoker -- 0.20 packs/day    Types: Cigarettes  . Smokeless tobacco: Never Used  Comment: plans to quit by Christmas, down to 1 cigarette/week  . Alcohol Use: No  . Drug Use: No  . Sexual Activity: Not on file   Other Topics Concern  . Not on file   Social History Narrative  . No narrative on file   Review of Systems: Review of Systems  Constitutional: Negative for fever and chills.       Weight gain  HENT: Negative for congestion and sore throat.   Eyes: Positive for blurred vision.  Respiratory: Positive for cough (occasional ). Negative for shortness of breath and wheezing.   Cardiovascular: Negative for chest pain, palpitations and leg swelling.  Gastrointestinal: Positive for abdominal pain (with lyrica). Negative for nausea,  vomiting, diarrhea, constipation and blood in stool.  Genitourinary: Negative for dysuria, urgency, frequency, hematuria and flank pain.  Musculoskeletal: Negative for myalgias.       Pain all over   Skin: Positive for itching (with lyrica) and rash (with lyrica ).  Neurological: Positive for sensory change (chronic peripheral neuropathy). Negative for dizziness and headaches.  Endo/Heme/Allergies: Positive for polydipsia.     Objective:  Physical Exam: Filed Vitals:   07/08/14 1433  BP: 148/87  Pulse: 88  Temp: 97.5 F (36.4 C)  TempSrc: Oral  Height: 5' 3.5" (1.613 m)  Weight: 186 lb (84.369 kg)  SpO2: 99%    Physical Exam  Constitutional: She is oriented to person, place, and time. She appears well-developed and well-nourished. No distress.  HENT:  Head: Normocephalic and atraumatic.  Right Ear: External ear normal.  Left Ear: External ear normal.  Nose: Nose normal.  Mouth/Throat: Oropharynx is clear and moist. No oropharyngeal exudate.  Eyes: EOM are normal.  Neck: Normal range of motion. Neck supple.  Cardiovascular: Normal rate, regular rhythm and normal heart sounds.   Pulmonary/Chest: Effort normal and breath sounds normal. No respiratory distress. She has no wheezes. She has no rales.  Abdominal: Soft. Bowel sounds are normal. She exhibits no distension. There is no tenderness. There is no rebound and no guarding.  Musculoskeletal: Normal range of motion. She exhibits no edema and no tenderness.  Neurological: She is alert and oriented to person, place, and time.  Normal sensation to light touch of bilateral extremities   Skin: Skin is warm and dry. No rash noted. She is not diaphoretic. No erythema. No pallor.  Psychiatric: She has a normal mood and affect. Her behavior is normal. Judgment and thought content normal.    Assessment & Plan:   Please see problem list for problem-based assessment and plan

## 2014-07-08 NOTE — Assessment & Plan Note (Addendum)
Assessment: Pt with chronic peripheral neuropathy most likely due to diabetes who presents with worsening pain after pregabalin therapy for 2 months.    Plan:  -Discontinue pregabalin 75 mg BID  -Pt instructed to start taking gralise 300 mg daily for 1 day then 600 mg for 1 day then increase by 300 mg four days later (max 1800 mg daily)
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
## 2014-07-08 NOTE — Patient Instructions (Addendum)
-  Stop taking amaryl, continue taking metformin  -Increase Novolog mix from 17 U/ 13U to 20 U in AM and 15 U in PM -Start taking gralise 300 mg daily for 1 day then 600 mg for 1 day then increase by 300 four days later (max 1800 mg daily) -Start taking lisinopril 20 mg daily for high blood pressure -Start taking lescol 40 mg daily for high cholesterol  -Will check your bloodwork today -Will see you back in 1 month   General Instructions:   Thank you for bringing your medicines today. This helps us keep you safe from mistakes.   Progress Toward Treatment Goals:  Treatment Goal 12/30/2013  Hemoglobin A1C improved  Blood pressure improved    Self Care Goals & Plans:  Self Care Goal 10/14/2013  Manage my medications take my medicines as prescribed; refill my medications on time  Monitor my health check my feet daily  Eat healthy foods eat baked foods instead of fried foods; eat foods that are low in salt; drink diet soda or water instead of juice or soda  Be physically active find an activity I enjoy  Stop smoking cut down the number of cigarettes smoked; go to the [employer] website ([email or web address].eewww.quitlinenc.com)    Home Blood Glucose Monitoring 12/30/2013  Check my blood sugar 2 times a day     Care Management & Community Referrals:  No flowsheet data found.

## 2014-07-08 NOTE — Progress Notes (Signed)
Case discussed with Dr. Rabbani at the time of the visit.  We reviewed the resident's history and exam and pertinent patient test results.  I agree with the assessment, diagnosis, and plan of care documented in the resident's note. 

## 2014-07-09 LAB — BASIC METABOLIC PANEL WITH GFR
BUN: 17 mg/dL (ref 6–23)
CO2: 28 mEq/L (ref 19–32)
Calcium: 9.4 mg/dL (ref 8.4–10.5)
Chloride: 102 mEq/L (ref 96–112)
Creat: 0.92 mg/dL (ref 0.50–1.10)
GFR, EST NON AFRICAN AMERICAN: 70 mL/min
GFR, Est African American: 80 mL/min
Glucose, Bld: 49 mg/dL — ABNORMAL LOW (ref 70–99)
Potassium: 4.4 mEq/L (ref 3.5–5.3)
SODIUM: 138 meq/L (ref 135–145)

## 2014-07-09 LAB — MICROALBUMIN / CREATININE URINE RATIO
CREATININE, URINE: 101.5 mg/dL
MICROALB UR: 1.29 mg/dL (ref 0.00–1.89)
Microalb Creat Ratio: 12.7 mg/g (ref 0.0–30.0)

## 2014-07-13 ENCOUNTER — Other Ambulatory Visit: Payer: Self-pay | Admitting: Dietician

## 2014-07-13 DIAGNOSIS — E119 Type 2 diabetes mellitus without complications: Secondary | ICD-10-CM

## 2014-07-13 NOTE — Progress Notes (Signed)
Patient requests assistance with  Transportation and possibly her living situation.

## 2014-07-13 NOTE — Progress Notes (Signed)
Thanks [name] and OdessaShana for your help, appreciate it!

## 2014-07-18 ENCOUNTER — Telehealth: Payer: Self-pay | Admitting: *Deleted

## 2014-07-18 ENCOUNTER — Telehealth: Payer: Self-pay | Admitting: Licensed Clinical Social Worker

## 2014-07-18 ENCOUNTER — Other Ambulatory Visit: Payer: Self-pay | Admitting: Internal Medicine

## 2014-07-18 DIAGNOSIS — E785 Hyperlipidemia, unspecified: Secondary | ICD-10-CM

## 2014-07-18 DIAGNOSIS — G629 Polyneuropathy, unspecified: Secondary | ICD-10-CM

## 2014-07-18 MED ORDER — GABAPENTIN 300 MG PO CAPS: 300.0000 mg | ORAL_CAPSULE | Freq: Three times a day (TID) | ORAL | Status: DC

## 2014-07-18 MED ORDER — PRAVASTATIN SODIUM 40 MG PO TABS: 40.0000 mg | ORAL_TABLET | Freq: Every evening | ORAL | Status: DC

## 2014-07-18 NOTE — Telephone Encounter (Signed)
Will change fluvastatin to pravastatin 40 mg daily. Will change gralise to gabapentin capsules. Thanks!  Dr. Johna Roles

## 2014-07-18 NOTE — Telephone Encounter (Signed)
CSW placed called to pt.  CSW left message requesting return call. CSW provided contact hours and phone number. 

## 2014-07-18 NOTE — Telephone Encounter (Signed)
Received faxed PA request from pt's pharmacy for Gralise- this medication is non preferred, preferred med is gabapentin capsules. Fluvastatin sodium is also non preferred, preferred meds include atorvastatin, lovastatin, pravastatin, and simvastatin.  Will forward info to PCP for review.  Please advise.Linda Spittle Cassady8/24/20159:11 AM    [phone]

## 2014-07-21 NOTE — Telephone Encounter (Signed)
CSW placed called to pt.  CSW left message requesting return call. CSW provided contact hours and phone number. 
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
## 2014-08-19 ENCOUNTER — Encounter: Payer: Medicaid Other | Admitting: Internal Medicine

## 2014-08-19 ENCOUNTER — Encounter: Payer: Medicaid Other | Admitting: Dietician

## 2014-09-02 ENCOUNTER — Ambulatory Visit (INDEPENDENT_AMBULATORY_CARE_PROVIDER_SITE_OTHER): Payer: Medicaid Other | Admitting: Dietician

## 2014-09-02 ENCOUNTER — Encounter: Payer: Self-pay | Admitting: Internal Medicine

## 2014-09-02 ENCOUNTER — Ambulatory Visit (INDEPENDENT_AMBULATORY_CARE_PROVIDER_SITE_OTHER): Payer: Medicaid Other | Admitting: Internal Medicine

## 2014-09-02 ENCOUNTER — Encounter: Payer: Self-pay | Admitting: Dietician

## 2014-09-02 VITALS — BP 155/61 | HR 66 | Wt 187.3 lb

## 2014-09-02 DIAGNOSIS — E1142 Type 2 diabetes mellitus with diabetic polyneuropathy: Secondary | ICD-10-CM

## 2014-09-02 DIAGNOSIS — Z0000 Encounter for general adult medical examination without abnormal findings: Secondary | ICD-10-CM

## 2014-09-02 DIAGNOSIS — E1140 Type 2 diabetes mellitus with diabetic neuropathy, unspecified: Secondary | ICD-10-CM

## 2014-09-02 DIAGNOSIS — B373 Candidiasis of vulva and vagina: Secondary | ICD-10-CM

## 2014-09-02 DIAGNOSIS — D509 Iron deficiency anemia, unspecified: Secondary | ICD-10-CM

## 2014-09-02 DIAGNOSIS — G629 Polyneuropathy, unspecified: Secondary | ICD-10-CM

## 2014-09-02 DIAGNOSIS — K089 Disorder of teeth and supporting structures, unspecified: Secondary | ICD-10-CM

## 2014-09-02 DIAGNOSIS — I10 Essential (primary) hypertension: Secondary | ICD-10-CM

## 2014-09-02 DIAGNOSIS — K088 Other specified disorders of teeth and supporting structures: Secondary | ICD-10-CM

## 2014-09-02 DIAGNOSIS — B3731 Acute candidiasis of vulva and vagina: Secondary | ICD-10-CM

## 2014-09-02 MED ORDER — INSULIN ASPART PROT & ASPART (70-30 MIX) 100 UNIT/ML ~~LOC~~ SUSP: SUBCUTANEOUS | Status: DC

## 2014-09-02 MED ORDER — FLUCONAZOLE 150 MG PO TABS: 150.0000 mg | ORAL_TABLET | Freq: Once | ORAL | Status: DC

## 2014-09-02 MED ORDER — LISINOPRIL 40 MG PO TABS: 40.0000 mg | ORAL_TABLET | Freq: Every day | ORAL | Status: DC

## 2014-09-02 MED ORDER — AMITRIPTYLINE HCL 25 MG PO TABS: 25.0000 mg | ORAL_TABLET | Freq: Every day | ORAL | Status: DC

## 2014-09-02 NOTE — Progress Notes (Signed)
Patient ID: Linda Miller, female   DOB: 1956/04/14, 58 y.o.   MRN: [id number]    Subjective:   Patient ID: Linda Miller female   DOB: 04/01/1956 58 y.o.   MRN: [id number]  HPI: [name] is a 58 y.o. pleasant woman with past medical history of HTN, hyperlipidemia, insulin-dependent T2DM, and osteomyelitis s/p right heel replacement who presents for routine follow-up visit.   Her last A1c was 8.3 on 07/08/14. She reports checking her blood sugar about three times daily and brought her glucose meter in today which reveals range of 217-383 and average glucose of 310. She denies symptomatic hypoglycemia after glimepiride was discontinued at last visit. She is compliant with taking Novolog (70/30) 20 U in AM and 17 U in PM.  She reports chronic blurry vision, polyuria, and polydipsia but denies polyphagia or foot injury. She has modified her diet and has been consuming more veggies and less juice. She does not exercise much. Her weight has been stable since last visit 2 months ago. She reports being under a lot of stress recently but is now living under a better environment.   She has probable chronic peripheral neuropathic pain and did not tolerate gabapentin that was prescribed at last visit due to GI upset (loose stools). She reports it did not control her pain and has consequently stopped taking it. She describes the pain occuring predominately in her extremities as sharp and burning with some numbness and tingling. She has not been able to sleep due to the pain. She has not had prior work-up for neuropathy.    She is compliant with taking lisinopril that was prescribed at last visit for hypertension. She has chronic blurry vision but denies headache, chest pain, LE edema or lightheadedness.  She is also taking pravastatin that was prescribed at last visit for hyperlipidemia. She denies muscle cramping.   She reports having recurrent yeast infections that have been successfully treated  with diflucan in the past. She reports having vaginal itching with no urinary symptoms. She has chronic lower abdominal pain.    She has poor dentition with molars in the back of both sides that having fillings needing repair due to possible decay. She would like referral to dentistry for further management and care.      Past Medical History  Diagnosis Date  . Diabetes mellitus   . Hypertension   . High cholesterol   . Osteomyelitis of right foot    Current Outpatient Prescriptions  Medication Sig Dispense Refill  . ACCU-CHEK FASTCLIX LANCETS MISC 1 each by Does not apply route 3 (three) times daily before meals. Insulin requiring dx code 250.00  102 each  11  . Blood Glucose Monitoring Suppl (ACCU-CHEK NANO SMARTVIEW) W/DEVICE KIT 1 each by Does not apply route 2 (two) times daily.  1 kit  0  . gabapentin (NEURONTIN) 300 MG capsule Take 1 capsule (300 mg total) by mouth 3 (three) times daily.  90 capsule  5  . glucose blood (ACCU-CHEK SMARTVIEW) test strip 3 times daily before meals, 250.02, insulin requiring  100 each  11  . insulin aspart protamine- aspart (NOVOLOG MIX 70/30) (70-30) 100 UNIT/ML injection Inject 20 units with breakfast & 15 units with dinner each day  10 mL  4  . Insulin Syringe-Needle U-100 31G X 15/64" 0.3 ML MISC 1 each by Does not apply route 2 (two) times daily. Use to inject insulin twice daily dx code 250.02  100 each  11  . Insulin Syringes,  Disposable, U-100 0.3 ML MISC Use twice daily to inject insulin, 250.00, insulin requiring  100 each  4  . lisinopril (PRINIVIL,ZESTRIL) 20 MG tablet Take 1 tablet (20 mg total) by mouth daily.  90 tablet  3  . metFORMIN (GLUCOPHAGE) 1000 MG tablet Take 1 tablet (1,000 mg total) by mouth 2 (two) times daily.  180 tablet  2  . pravastatin (PRAVACHOL) 40 MG tablet Take 1 tablet (40 mg total) by mouth every evening.  90 tablet  3   No current facility-administered medications for this visit.   Family History  Problem  Relation Age of Onset  . Diabetes Mother   . Cancer Father    History   Social History  . Marital Status: Legally Separated    Spouse Name: N/A    Number of Children: N/A  . Years of Education: 40   Social History Main Topics  . Smoking status: Former Smoker -- 0.20 packs/day    Types: Cigarettes    Quit date: 11/07/2013  . Smokeless tobacco: Former Systems developer    Quit date: 06/25/2014     Comment: plans to quit by Christmas, down to 1 cigarette/week  . Alcohol Use: Yes  . Drug Use: No  . Sexual Activity: Not on file   Other Topics Concern  . Not on file   Social History Narrative  . No narrative on file   Review of Systems: Review of Systems  Constitutional: Negative for fever, chills and weight loss.  Eyes: Positive for blurred vision (chronic ).  Respiratory: Positive for cough (dry). Negative for shortness of breath and wheezing.   Cardiovascular: Negative for chest pain and leg swelling.  Gastrointestinal: Positive for abdominal pain (chronic lower abdominal ) and diarrhea (after starrting gabapentin). Negative for nausea, vomiting, constipation and blood in stool.  Genitourinary: Negative for dysuria, urgency, frequency and hematuria.       Vaginal itching  Musculoskeletal: Positive for back pain (chronic low back ) and joint pain (hips). Negative for myalgias.  Neurological: Positive for sensory change (chronic peripheral parasethesias ). Negative for dizziness and headaches.  Endo/Heme/Allergies: Positive for polydipsia (chronic).  Psychiatric/Behavioral: The patient has insomnia (due to uncontrolled pain).     Objective:  Physical Exam: Filed Vitals:   09/02/14 1346  BP: 155/61  Pulse: 66  Weight: 187 lb 4.8 oz (84.959 kg)  SpO2: 100%   Physical Exam  Constitutional: She is oriented to person, place, and time. She appears well-developed and well-nourished. No distress.  HENT:  Head: Normocephalic and atraumatic.  Poor dentition  Eyes: EOM are normal.    Neck: Normal range of motion. Neck supple.  Cardiovascular: Normal rate and regular rhythm.   Pulmonary/Chest: Effort normal and breath sounds normal. No respiratory distress. She has no wheezes. She has no rales.  Abdominal: Soft. Bowel sounds are normal. She exhibits no distension. There is no tenderness. There is no rebound and no guarding.  Musculoskeletal: Normal range of motion. She exhibits no edema and no tenderness.  Neurological: She is alert and oriented to person, place, and time.  Normal 5/5 muscle strength throughout. Normal sensation to light touch of extremities.  Skin: Skin is warm and dry. No rash noted. She is not diaphoretic. No erythema. No pallor.  Psychiatric: She has a normal mood and affect. Her behavior is normal. Judgment and thought content normal.    Assessment & Plan:   Please see problem list for problem-based assessment and plan

## 2014-09-02 NOTE — Patient Instructions (Signed)
Diabetes and Standards of Medical Care Diabetes is complicated. To help everyone know what is going on and to help you get the care you deserve, the following schedule of care was developed to help keep you on track. Below are the tests, exams, vaccines, medicines, education, and plans you will need.  HbA1c test This test shows how well you have controlled your glucose over the past 2-3 months. It is used to see if your diabetes management plan needs to be adjusted.   Blood pressure test  This test is performed at every routine medical visit. The goal is less than 140/90 mm Hg for most people, but 130/80 mm Hg in some cases. Ask your health care provider about your goal.  Dental exam  Follow up with the dentist regularly.  Eye exam      you should hear from [place] about an eye exam closer to your home  If you are diagnosed with type 2 diabetes, get an exam as soon as possible after the diagnosis and then yearly.  Foot care exam  Visual foot exams are performed at every routine medical visit. The exams check for cuts, injuries, or other problems with the feet.  A comprehensive foot exam should be done yearly. This includes visual inspection as well as assessing foot pulses and testing for loss of sensation.  Check your feet nightly for cuts, injuries, or other problems with your feet. Tell your health care provider if anything is not healing.  Kidney function test (urine microalbumin)  This test is performed once a year..  Type 2 diabetes: The first test is performed at the time of diagnosis.  A serum creatinine and estimated glomerular filtration rate (eGFR) test is done once a year to assess the level of chronic kidney disease (CKD), if present.  Lipid profile (cholesterol, HDL, LDL, triglycerides)  Performed every 5 years for most people.  The goal for LDL is less than 100 mg/dL. If you are at high risk, the goal is less than 70 mg/dL.  The goal for HDL is 40 mg/dL-50 mg/dL for  men and 50 mg/dL-60 mg/dL for women. An HDL cholesterol of 60 mg/dL or higher gives some protection against heart disease.  The goal for triglycerides is less than 150 mg/dL.  Influenza vaccine, pneumococcal vaccine, and hepatitis B vaccine  The influenza vaccine is recommended yearly.  It is recommended that people with diabetes who are over 46 years old get the pneumonia vaccine. In some cases, two separate shots may be given. Ask your health care provider if your pneumonia vaccination is up to date.  The hepatitis B vaccine is also recommended for adults with diabetes.  Diabetes self-management education  Education is recommended at diagnosis and ongoing as needed.  Treatment plan  Your treatment plan is reviewed at every medical visit.

## 2014-09-02 NOTE — Patient Instructions (Addendum)
-  Start taking novolog 20 U twice a day  -Start taking amitriptyline 25 mg daily at night for your nerve pain, stop taking gabapentin -Start taking lisinopril-HCTZ daily for high blood pressure -Take diflucan x 1 dose for vaginal yeast infection  -Will refer you to opthalmology and dentistry  -Come back in 1 month to recheck your A1c  General Instructions:   Thank you for bringing your medicines today. This helps us keep you safe from mistakes.   Progress Toward Treatment Goals:  Treatment Goal 12/30/2013  Hemoglobin A1C improved  Blood pressure improved    Self Care Goals & Plans:  Self Care Goal 10/14/2013  Manage my medications take my medicines as prescribed; refill my medications on time  Monitor my health check my feet daily  Eat healthy foods eat baked foods instead of fried foods; eat foods that are low in salt; drink diet soda or water instead of juice or soda  Be physically active find an activity I enjoy  Stop smoking cut down the number of cigarettes smoked; go to the [employer] website ([employer]ww[employer]quit[employer].com)    Home Blood Glucose Monitoring 12/30/2013  Check my blood sugar 2 times a day     Care Management & Community Referrals:  No flowsheet data found.
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
## 2014-09-03 DIAGNOSIS — B3731 Acute candidiasis of vulva and vagina: Secondary | ICD-10-CM | POA: Insufficient documentation

## 2014-09-03 DIAGNOSIS — B373 Candidiasis of vulva and vagina: Secondary | ICD-10-CM | POA: Insufficient documentation

## 2014-09-03 DIAGNOSIS — D509 Iron deficiency anemia, unspecified: Secondary | ICD-10-CM | POA: Insufficient documentation

## 2014-09-03 DIAGNOSIS — K089 Disorder of teeth and supporting structures, unspecified: Secondary | ICD-10-CM | POA: Insufficient documentation

## 2014-09-03 LAB — COMPLETE METABOLIC PANEL WITH GFR
ALBUMIN: 3.9 g/dL (ref 3.5–5.2)
ALK PHOS: 64 U/L (ref 39–117)
ALT: 10 U/L (ref 0–35)
AST: 11 U/L (ref 0–37)
BUN: 6 mg/dL (ref 6–23)
CHLORIDE: 101 meq/L (ref 96–112)
CO2: 27 mEq/L (ref 19–32)
CREATININE: 1.08 mg/dL (ref 0.50–1.10)
Calcium: 9.3 mg/dL (ref 8.4–10.5)
GFR, Est African American: 66 mL/min
GFR, Est Non African American: 58 mL/min — ABNORMAL LOW
Glucose, Bld: 273 mg/dL — ABNORMAL HIGH (ref 70–99)
POTASSIUM: 4 meq/L (ref 3.5–5.3)
Sodium: 138 mEq/L (ref 135–145)
Total Bilirubin: 0.5 mg/dL (ref 0.2–1.2)
Total Protein: 7.3 g/dL (ref 6.0–8.3)

## 2014-09-03 MED ORDER — LISINOPRIL-HYDROCHLOROTHIAZIDE 20-12.5 MG PO TABS
1.0000 | ORAL_TABLET | Freq: Every day | ORAL | Status: DC
Start: 2014-09-03 — End: 2015-11-10

## 2014-09-03 NOTE — Assessment & Plan Note (Signed)
Pt to have annual influenza vaccination at next visit.

## 2014-09-03 NOTE — Assessment & Plan Note (Addendum)
Assessment: Pt with history of poor dentition who presents with no signs of infection.   Plan:  -Pt to make appointment with dentistry for further evaluation

## 2014-09-03 NOTE — Assessment & Plan Note (Addendum)
Assessment: Pt with chronic peripheral polyneuropathy most likely due to diabetes who presents with uncontrolled pain after starting gabapentin therapy for past 2 months.   Plan:  -Discontinue gabapentin due to GI intolerance  -Prescribe amitriptyline 15 mg daily at bedtime  -Obtain neuropathy work-up at next visit to exclude other causes of neuropathy  -Consider referral to neurology for EMG testing to confirm diagnosis

## 2014-09-03 NOTE — Assessment & Plan Note (Addendum)
Assessment: Pt with history of vaginal itching successfully treated with anti-fungal therapy in the past who presents with recurrent symptoms most likely due to vaginal candidiasis.   Plan:  -No urinary symptoms to warrant UTI. Pt declined vaginal examination. -Prescribe fluconazole 150 mg x 1 dose -Monitor for resolution

## 2014-09-03 NOTE — Assessment & Plan Note (Addendum)
Assessment: Pt with hypertension newly started on one-class (ACEi) anti-hypertensive therapy who presents with blood pressure of 155/61.   Plan:  -BP 155/61 later 148/91 not at goal <140/90 -Start HCTZ 12.5 mg daily with lisinopril 20 mg daily (combination pill)   -Obtain CMP --> Cr increased from 0.92 to 1.08 GFR 80 to 66 (< 30 % reduction in GFR after starting ACEi therapy)

## 2014-09-03 NOTE — Assessment & Plan Note (Signed)
Assessment: Pt with last A1c of 8.3 on 07/08/14 compliant with oral hypoglycemic and insulin therapy with no recent symptomatic hypoglycemia who presents with blood glucose of 273.  Plan:   -A1 8.3 not at goal < 7,  continue metformin 1000 mg BID and increase Novolog mix 70/30 from 20 U AM/ 15 U PM (reports taking 17 U) to 20 U BD. Pt to return in 1 month for further adjustment.  -BP 155/61 not at goal <140/90, start HCTZ 12.5 mg daily with lisinopril 20 mg daily (combination pill)   -LDL 170 not at goal <100, continue pravastatin 40 mg daily  -Last annual foot exam on 07/08/14   -Last annual urine microalbumin on 07/08/14 was normal   -Last annual eye exam on 08/25/13 with mild hypertensive retinopathy, refer to opthalmology   -BMI 32.65 not at goal <30, encourage weight loss  -Pt met with Debera Lat today for diabetic counseling

## 2014-09-03 NOTE — Assessment & Plan Note (Signed)
Assessment: Pt with history of microcytic anemia since 2013 with no past anemia panel who is s/p hysterectomy without prior screening colonoscopy who presents with no active bleeding or hemodynamic instability.   Plan:  -Obtain CBC and anemia panel at next visit -Will discuss screening colonoscopy at next visit

## 2014-09-05 NOTE — Progress Notes (Signed)
Internal Medicine Clinic Attending  Case discussed with Dr. Rabbani soon after the resident saw the patient.  We reviewed the resident's history and exam and pertinent patient test results.  I agree with the assessment, diagnosis, and plan of care documented in the resident's note.  

## 2014-11-04 ENCOUNTER — Emergency Department (HOSPITAL_COMMUNITY)
Admission: EM | Admit: 2014-11-04 | Discharge: 2014-11-04 | Disposition: A | Discharge status: Home / Self Care | Payer: Medicaid Other | Attending: Emergency Medicine | Admitting: Emergency Medicine

## 2014-11-04 ENCOUNTER — Emergency Department (HOSPITAL_COMMUNITY): Payer: Medicaid Other

## 2014-11-04 ENCOUNTER — Encounter (HOSPITAL_COMMUNITY): Payer: Self-pay | Admitting: *Deleted

## 2014-11-04 DIAGNOSIS — Z794 Long term (current) use of insulin: Secondary | ICD-10-CM | POA: Insufficient documentation

## 2014-11-04 DIAGNOSIS — Z87891 Personal history of nicotine dependence: Secondary | ICD-10-CM | POA: Insufficient documentation

## 2014-11-04 DIAGNOSIS — S82831A Other fracture of upper and lower end of right fibula, initial encounter for closed fracture: Secondary | ICD-10-CM | POA: Diagnosis not present

## 2014-11-04 DIAGNOSIS — S99921A Unspecified injury of right foot, initial encounter: Secondary | ICD-10-CM | POA: Diagnosis not present

## 2014-11-04 DIAGNOSIS — S82891A Other fracture of right lower leg, initial encounter for closed fracture: Secondary | ICD-10-CM

## 2014-11-04 DIAGNOSIS — Z79899 Other long term (current) drug therapy: Secondary | ICD-10-CM | POA: Insufficient documentation

## 2014-11-04 DIAGNOSIS — Z8739 Personal history of other diseases of the musculoskeletal system and connective tissue: Secondary | ICD-10-CM | POA: Diagnosis not present

## 2014-11-04 DIAGNOSIS — Y9301 Activity, walking, marching and hiking: Secondary | ICD-10-CM | POA: Diagnosis not present

## 2014-11-04 DIAGNOSIS — E119 Type 2 diabetes mellitus without complications: Secondary | ICD-10-CM | POA: Insufficient documentation

## 2014-11-04 DIAGNOSIS — I10 Essential (primary) hypertension: Secondary | ICD-10-CM | POA: Diagnosis not present

## 2014-11-04 DIAGNOSIS — Y92019 Unspecified place in single-family (private) house as the place of occurrence of the external cause: Secondary | ICD-10-CM | POA: Diagnosis not present

## 2014-11-04 DIAGNOSIS — W19XXXA Unspecified fall, initial encounter: Secondary | ICD-10-CM

## 2014-11-04 DIAGNOSIS — W108XXA Fall (on) (from) other stairs and steps, initial encounter: Secondary | ICD-10-CM | POA: Diagnosis not present

## 2014-11-04 DIAGNOSIS — Y998 Other external cause status: Secondary | ICD-10-CM | POA: Insufficient documentation

## 2014-11-04 DIAGNOSIS — S99911A Unspecified injury of right ankle, initial encounter: Secondary | ICD-10-CM | POA: Diagnosis present

## 2014-11-04 DIAGNOSIS — E785 Hyperlipidemia, unspecified: Secondary | ICD-10-CM | POA: Diagnosis not present

## 2014-11-04 DIAGNOSIS — W010XXA Fall on same level from slipping, tripping and stumbling without subsequent striking against object, initial encounter: Secondary | ICD-10-CM | POA: Diagnosis not present

## 2014-11-04 MED ORDER — IBUPROFEN 800 MG PO TABS: 800.0000 mg | ORAL_TABLET | Freq: Three times a day (TID) | ORAL | Status: DC

## 2014-11-04 MED ORDER — OXYCODONE-ACETAMINOPHEN 5-325 MG PO TABS
1.0000 | ORAL_TABLET | Freq: Once | ORAL | Status: AC
  Administered 2014-11-04: 1 via ORAL
  Filled 2014-11-04: qty 1

## 2014-11-04 MED ORDER — OXYCODONE-ACETAMINOPHEN 5-325 MG PO TABS: 1.0000 | ORAL_TABLET | Freq: Four times a day (QID) | ORAL | Status: DC | PRN

## 2014-11-04 MED ORDER — HYDROMORPHONE HCL 1 MG/ML IJ SOLN
1.0000 mg | Freq: Once | INTRAMUSCULAR | Status: AC
  Administered 2014-11-04: 1 mg via INTRAMUSCULAR
  Filled 2014-11-04: qty 1

## 2014-11-04 NOTE — ED Notes (Signed)
Pt states fall occurred around 2100 last night. Pt denies taking OTC medication for the pain. Pt presents with swelling to right ankle and tender to touch. Pt states she has been unable to apply any weight to right ankle since the fall.

## 2014-11-04 NOTE — Discharge Instructions (Signed)
Call an orthopedic specialist for further evaluation of your ankle fracture. Use crutches at all times, do not put any weight on your right foot. Continue to wear your splint at all times. Ice your foot 3-4 times a day, elevate above your heart when you're not standing or walking, to reduce swelling and pain. Call for a follow up appointment with a Family or Primary Care Provider.  Return if Symptoms worsen.   Take medication as prescribed.

## 2014-11-04 NOTE — ED Notes (Signed)
Per EMS: Pt coming from home with c/o fall and right ankle pain. Fall was from a standing position. Pt states she tripped down steps at her house. Denies LOC, did not hit head. CMS intact. Pt A&Ox4, respirations equal and unlabored, skin warm and dry

## 2014-11-04 NOTE — ED Provider Notes (Signed)
CSN: [id number]     Arrival date & time 11/04/14  3536 History   First MD Initiated Contact with Patient 11/04/14 [phone]     Chief Complaint  Patient presents with  . Fall  . Ankle Pain     (Consider location/radiation/quality/duration/timing/severity/associated sxs/prior Treatment) HPI Comments: The patient is a 58 year old female presenting to the prior chief complaint of right ankle injury occurring last night. Patient reports while walking down the stairs she tripped and fell. She denies blow to head or LOC. She reports taking over-the-counter pain medication without full resolution of symptoms. She reports she is unable to bear weight due to pain, reports increase in pain with movement. Denies other injury. OrthoKevan Ny  Patient is a 58 y.o. female presenting with fall and ankle pain. The history is provided by the patient. No language interpreter was used.  Fall Associated symptoms include arthralgias and joint swelling. Pertinent negatives include no fever or numbness.  Ankle Pain Associated symptoms: no fever     Past Medical History  Diagnosis Date  . Diabetes mellitus   . Hypertension   . High cholesterol   . Osteomyelitis of right foot    Past Surgical History  Procedure Laterality Date  . Abdominal hysterectomy    . Debridement  foot    . I&d extremity  09/23/2012    Procedure: IRRIGATION AND DEBRIDEMENT EXTREMITY;  Surgeon: Newt Minion, MD;  Location: [facility];  Service: Orthopedics;  Laterality: Right;  . Tee without cardioversion  09/24/2012    Procedure: TRANSESOPHAGEAL ECHOCARDIOGRAM (TEE);  Surgeon: Larey Dresser, MD;  Location: [facility] ENDOSCOPY;  Service: Cardiovascular;  Laterality: N/A;   Family History  Problem Relation Age of Onset  . Diabetes Mother   . Cancer Father    History  Substance Use Topics  . Smoking status: Former Smoker -- 0.20 packs/day    Types: Cigarettes    Quit date: 11/07/2013  . Smokeless tobacco: Former Systems developer    Quit date:  06/25/2014     Comment: plans to quit by Christmas, down to 1 cigarette/week  . Alcohol Use: Yes   OB History    No data available     Review of Systems  Constitutional: Negative for fever.  Musculoskeletal: Positive for joint swelling, arthralgias and gait problem.  Skin: Negative for color change and wound.  Neurological: Negative for syncope, light-headedness and numbness.      Allergies  Codeine; Lyrica; and Tramadol  Home Medications   Prior to Admission medications   Medication Sig Start Date End Date Taking? Authorizing Provider  ACCU-CHEK FASTCLIX LANCETS MISC 1 each by Does not apply route 3 (three) times daily before meals. Insulin requiring dx code 250.00 09/22/13   Karren Cobble, MD  amitriptyline (ELAVIL) 25 MG tablet Take 1 tablet (25 mg total) by mouth at bedtime. 09/02/14   Juluis Mire, MD  Blood Glucose Monitoring Suppl (ACCU-CHEK NANO SMARTVIEW) W/DEVICE KIT 1 each by Does not apply route 2 (two) times daily. 08/16/13   Madilyn Fireman, MD  fluconazole (DIFLUCAN) 150 MG tablet Take 1 tablet (150 mg total) by mouth once. 09/02/14   Juluis Mire, MD  glucose blood (ACCU-CHEK SMARTVIEW) test strip 3 times daily before meals, 250.02, insulin requiring 12/30/13   Dixon Boos, MD  insulin aspart protamine- aspart (NOVOLOG MIX 70/30) (70-30) 100 UNIT/ML injection Inject 20 units with breakfast & 20 units with dinner each day 09/02/14   Juluis Mire, MD  Insulin Syringe-Needle U-100 31G X 15/64"  0.3 ML MISC 1 each by Does not apply route 2 (two) times daily. Use to inject insulin twice daily dx code 250.02 12/30/13   Dixon Boos, MD  Insulin Syringes, Disposable, U-100 0.3 ML MISC Use twice daily to inject insulin, 250.00, insulin requiring 12/30/13   Dixon Boos, MD  lisinopril-hydrochlorothiazide (PRINZIDE,ZESTORETIC) 20-12.5 MG per tablet Take 1 tablet by mouth daily. 09/03/14   Juluis Mire, MD  metFORMIN (GLUCOPHAGE) 1000 MG tablet Take 1 tablet  (1,000 mg total) by mouth 2 (two) times daily. 05/10/14   Juluis Mire, MD  pravastatin (PRAVACHOL) 40 MG tablet Take 1 tablet (40 mg total) by mouth every evening. 07/18/14 07/18/15  Juluis Mire, MD   BP 146/80 mmHg  Pulse 104  Temp(Src) 98.1 F (36.7 C) (Oral)  Resp 22  SpO2 95% Physical Exam  Constitutional: She is oriented to person, place, and time. She appears well-developed and well-nourished. No distress.  HENT:  Head: Normocephalic and atraumatic.  Neck: Neck supple.  Cardiovascular:  Pulses:      Dorsalis pedis pulses are 1+ on the right side, and 1+ on the left side.  Pulmonary/Chest: Effort normal. No respiratory distress.  Musculoskeletal:       Right ankle: She exhibits decreased range of motion and swelling. She exhibits no deformity and normal pulse. Tenderness. Achilles tendon exhibits no defect and normal Thompson's test results.       Right foot: There is bony tenderness.       Feet:  Moderate swelling, increased over the lateral aspect of the ankle. Neurovascularly intact distally.  Neurological: She is alert and oriented to person, place, and time.  Skin: Skin is warm and dry. She is not diaphoretic.  Psychiatric: She has a normal mood and affect. Her behavior is normal.  Nursing note and vitals reviewed.   ED Course  Procedures (including critical care time) Labs Review Labs Reviewed - No data to display  Imaging Review Dg Ankle Complete Right  11/04/2014   CLINICAL DATA:  58 year old female status post fall with right ankle pain. Initial encounter.  EXAM: RIGHT ANKLE - COMPLETE 3+ VIEW  COMPARISON:  09/16/2012 foot series.  FINDINGS: Spiral fracture of the distal right fibula, 2 cm above the mortise joint. Mild lateral displacement of the distal fragment. No definite joint effusion. There is mild lateral subluxation of the mortise joint. Talar dome intact. Small irregular ossific fragment adjacent to the medial malleolus. There is chronic flattening of  the plantar surface of the calcaneus, with mild associated cortical sclerosis. New since 2013. Otherwise the calcaneus appears intact.  IMPRESSION: 1. Mildly laterally displaced spiral fracture of the distal right fibula 2 cm above the mortise joint. 2. Mild lateral subluxation of the mortise joint. Possible acute avulsion fracture from the medial malleolus. 3. Chronic changes to the plantar surface of the calcaneus, new since 2013.   Electronically Signed   By: Lars Pinks M.D.   On: 11/04/2014 07:03   Dg Foot Complete Right  11/04/2014   CLINICAL DATA:  58 year old female status post fall with foot pain. Initial encounter.  EXAM: RIGHT FOOT COMPLETE - 3+ VIEW  COMPARISON:  Right ankle series from earlier today. Right foot series 10 02/12/2012.  FINDINGS: Medial and lateral fractures about the right ankle re- identified.  Sclerosis and flattening of the plantar surface of the calcaneus again noted. Joint spaces and alignment in the right foot are within normal limits. No other acute fracture or dislocation identified.  IMPRESSION: 1. Distal  right fibula and right medial malleolus fractures re- identified, see ankle series. 2. No superimposed acute fracture or dislocation identified about the right foot. 3. Chronic flattening of the plantar calcaneus, new since 2013.   Electronically Signed   By: Lars Pinks M.D.   On: 11/04/2014 07:36     EKG Interpretation None      MDM   Final diagnoses:  Closed right ankle fracture, initial encounter   Patient presents with a fall, x-ray shows distal fibular fracture the patient is also tender to the dorsum of foot, x-rays ordered. Pain medication ordered. Discussed imaging results, and treatment plan with the patient. Return precautions given. Reports understanding and no other concerns at this time.  Patient is stable for discharge at this time.  Meds given in ED:  Medications  oxyCODONE-acetaminophen (PERCOCET/ROXICET) 5-325 MG per tablet 1 tablet (1 tablet  Oral Given 11/04/14 4268)  HYDROmorphone (DILAUDID) injection 1 mg (1 mg Intramuscular Given 11/04/14 0719)    New Prescriptions   IBUPROFEN (ADVIL,MOTRIN) 800 MG TABLET    Take 1 tablet (800 mg total) by mouth 3 (three) times daily with meals.   OXYCODONE-ACETAMINOPHEN (PERCOCET) 5-325 MG PER TABLET    Take 1-2 tablets by mouth every 6 (six) hours as needed.     Harvie Heck, PA-C 11/04/14 3419  Johnna Acosta, MD 11/05/14 [id number]

## 2014-11-04 NOTE — Progress Notes (Signed)
Orthopedic Tech Progress Note Patient Details:  Vilinda FlakeJuanita Nalepa 1956/03/03 [id number]  Ortho Devices Type of Ortho Device: Crutches, Short leg splint Ortho Device/Splint Interventions: Application   Haskell Flirtewsome, Talor Desrosiers M 11/04/2014, 7:28 AM

## 2014-12-16 ENCOUNTER — Encounter: Payer: Medicaid Other | Admitting: Dietician

## 2014-12-16 ENCOUNTER — Encounter: Payer: Medicaid Other | Admitting: Internal Medicine

## 2014-12-23 ENCOUNTER — Encounter: Payer: Medicaid Other | Admitting: Dietician

## 2014-12-23 ENCOUNTER — Encounter: Payer: Medicaid Other | Admitting: Internal Medicine

## 2014-12-23 ENCOUNTER — Encounter: Payer: Self-pay | Admitting: Internal Medicine

## 2014-12-29 ENCOUNTER — Encounter: Payer: Medicaid Other | Admitting: Internal Medicine

## 2014-12-29 ENCOUNTER — Encounter: Payer: Medicaid Other | Admitting: Dietician

## 2015-01-12 ENCOUNTER — Encounter: Payer: Self-pay | Admitting: Internal Medicine

## 2015-01-12 NOTE — Addendum Note (Signed)
Addended by: Neomia DearPOWERS, Nery Frappier E on: 01/12/2015 06:02 PM   Modules accepted: Orders

## 2015-01-27 ENCOUNTER — Other Ambulatory Visit: Payer: Self-pay | Admitting: Internal Medicine

## 2015-01-31 MED ORDER — GLUCOSE BLOOD VI STRP: ORAL_STRIP | Status: DC

## 2015-02-10 ENCOUNTER — Encounter: Payer: Self-pay | Admitting: Dietician

## 2015-02-10 ENCOUNTER — Ambulatory Visit (INDEPENDENT_AMBULATORY_CARE_PROVIDER_SITE_OTHER): Payer: Medicaid Other | Admitting: Dietician

## 2015-02-10 ENCOUNTER — Other Ambulatory Visit: Payer: Self-pay | Admitting: Internal Medicine

## 2015-02-10 ENCOUNTER — Encounter: Payer: Self-pay | Admitting: Internal Medicine

## 2015-02-10 ENCOUNTER — Ambulatory Visit (INDEPENDENT_AMBULATORY_CARE_PROVIDER_SITE_OTHER): Payer: Medicaid Other | Admitting: Internal Medicine

## 2015-02-10 VITALS — BP 139/75 | HR 83 | Wt 181.7 lb

## 2015-02-10 DIAGNOSIS — E1165 Type 2 diabetes mellitus with hyperglycemia: Secondary | ICD-10-CM

## 2015-02-10 DIAGNOSIS — I10 Essential (primary) hypertension: Secondary | ICD-10-CM

## 2015-02-10 DIAGNOSIS — D509 Iron deficiency anemia, unspecified: Secondary | ICD-10-CM | POA: Diagnosis not present

## 2015-02-10 DIAGNOSIS — Z23 Encounter for immunization: Secondary | ICD-10-CM

## 2015-02-10 DIAGNOSIS — I129 Hypertensive chronic kidney disease with stage 1 through stage 4 chronic kidney disease, or unspecified chronic kidney disease: Secondary | ICD-10-CM

## 2015-02-10 DIAGNOSIS — N182 Chronic kidney disease, stage 2 (mild): Secondary | ICD-10-CM

## 2015-02-10 DIAGNOSIS — E785 Hyperlipidemia, unspecified: Secondary | ICD-10-CM | POA: Diagnosis not present

## 2015-02-10 DIAGNOSIS — Z0000 Encounter for general adult medical examination without abnormal findings: Secondary | ICD-10-CM

## 2015-02-10 DIAGNOSIS — E1140 Type 2 diabetes mellitus with diabetic neuropathy, unspecified: Secondary | ICD-10-CM

## 2015-02-10 DIAGNOSIS — E1142 Type 2 diabetes mellitus with diabetic polyneuropathy: Secondary | ICD-10-CM

## 2015-02-10 DIAGNOSIS — G629 Polyneuropathy, unspecified: Secondary | ICD-10-CM | POA: Diagnosis not present

## 2015-02-10 DIAGNOSIS — Z794 Long term (current) use of insulin: Secondary | ICD-10-CM

## 2015-02-10 DIAGNOSIS — E1122 Type 2 diabetes mellitus with diabetic chronic kidney disease: Secondary | ICD-10-CM | POA: Diagnosis not present

## 2015-02-10 LAB — CBC WITH DIFFERENTIAL/PLATELET
Basophils Absolute: 0 10*3/uL (ref 0.0–0.1)
Basophils Relative: 0 % (ref 0–1)
EOS ABS: 0.1 10*3/uL (ref 0.0–0.7)
Eosinophils Relative: 1 % (ref 0–5)
HEMATOCRIT: 46.4 % — AB (ref 36.0–46.0)
Hemoglobin: 15.2 g/dL — ABNORMAL HIGH (ref 12.0–15.0)
LYMPHS ABS: 1.7 10*3/uL (ref 0.7–4.0)
Lymphocytes Relative: 17 % (ref 12–46)
MCH: 26.3 pg (ref 26.0–34.0)
MCHC: 32.8 g/dL (ref 30.0–36.0)
MCV: 80.4 fL (ref 78.0–100.0)
MONO ABS: 0.7 10*3/uL (ref 0.1–1.0)
MONOS PCT: 7 % (ref 3–12)
MPV: 9.6 fL (ref 8.6–12.4)
Neutro Abs: 7.7 10*3/uL (ref 1.7–7.7)
Neutrophils Relative %: 75 % (ref 43–77)
Platelets: 346 10*3/uL (ref 150–400)
RBC: 5.77 MIL/uL — ABNORMAL HIGH (ref 3.87–5.11)
RDW: 16 % — AB (ref 11.5–15.5)
WBC: 10.2 10*3/uL (ref 4.0–10.5)

## 2015-02-10 LAB — LIPID PANEL
CHOL/HDL RATIO: 3.4 ratio
Cholesterol: 268 mg/dL — ABNORMAL HIGH (ref 0–200)
HDL: 78 mg/dL (ref >=46)
LDL Cholesterol: 165 mg/dL — ABNORMAL HIGH (ref 0–99)
Triglycerides: 126 mg/dL (ref <150)
VLDL: 25 mg/dL (ref 0–40)

## 2015-02-10 LAB — COMPLETE METABOLIC PANEL WITH GFR
ALT: 10 U/L (ref 0–35)
AST: 10 U/L (ref 0–37)
Albumin: 3.9 g/dL (ref 3.5–5.2)
Alkaline Phosphatase: 81 U/L (ref 39–117)
BUN: 10 mg/dL (ref 6–23)
CALCIUM: 9.3 mg/dL (ref 8.4–10.5)
CO2: 27 meq/L (ref 19–32)
Chloride: 98 mEq/L (ref 96–112)
Creat: 0.97 mg/dL (ref 0.50–1.10)
GFR, EST AFRICAN AMERICAN: 75 mL/min
GFR, Est Non African American: 65 mL/min
Glucose, Bld: 309 mg/dL — ABNORMAL HIGH (ref 70–99)
Potassium: 4 mEq/L (ref 3.5–5.3)
SODIUM: 136 meq/L (ref 135–145)
TOTAL PROTEIN: 7.6 g/dL (ref 6.0–8.3)
Total Bilirubin: 0.6 mg/dL (ref 0.2–1.2)

## 2015-02-10 LAB — GLUCOSE, CAPILLARY: Glucose-Capillary: 306 mg/dL — ABNORMAL HIGH (ref 70–99)

## 2015-02-10 LAB — POCT GLYCOSYLATED HEMOGLOBIN (HGB A1C): HEMOGLOBIN A1C: 8.6

## 2015-02-10 LAB — HM DIABETES EYE EXAM

## 2015-02-10 MED ORDER — AMITRIPTYLINE HCL 50 MG PO TABS: 50.0000 mg | ORAL_TABLET | Freq: Every day | ORAL | Status: DC

## 2015-02-10 NOTE — Patient Instructions (Signed)
It was a pleasure meeting with you today!  [name]   Can we make going to dentist your goal for the next 6 months?  We can make a referral to a dentist who accepts your insurance.

## 2015-02-10 NOTE — Progress Notes (Signed)
Patient ID: Merriam Brandner, female   DOB: February 24, 1956, 59 y.o.   MRN: [id number]    Subjective:   Patient ID: Dilana Mcphie female   DOB: 1956-04-01 59 y.o.   MRN: [id number]  HPI: Ms.Robertta Greenwalt is a 59 y.o. pleasant woman with past medical history of HTN, hyperlipidemia, insulin-dependent T2DM,  s/p right heel replacement, and recent right distal fibula and medial malleolus fractures who presents for routine follow-up visit.  Her last A1c was 8.3 on 07/08/14. She reports checking her blood sugar 5-6 times daily and unfortunately did not bring her glucose meter in today. She reports her average blood sugar is in the 100's. She has been taking Novolog (70/30) 20 U in AM and 17 U in PM instead of 20 U BID that was instructed at last visit.She is also taking metformin 1000 mg BID. She denies symptomatic hypoglycemia. She reports chronic blurry vision and polyuria but denies polyphagia, polydipsia, or foot ulcer. She was unable to attend her opthalmology appointment. She is trying to follow a healthy diet but unfortunately unable to exercise due to her recent right ankle fracture. She has lost 6 lbs since last visit 5 months ago.   She has chronic peripheral neuropathy with moderate control with amitriptyline which has also helped her mood. She would like to increase her dosage for better pain control.    She is compliant with taking lisinopril for hypertension. She has chronic blurry vision but denies headache, chest pain, LE edema or lightheadedness.  She is compliant with taking pravastatin for hyperlipidemia with mild muscle cramping.   She has history of microcytic anemia with no prior colonoscopy. She denies GI or GU bleeding. She has not had an anemia panel in the past to determine if she is iron deficient.   She would like to have annual flu shot today. She denies recent illness.      Past Medical History  Diagnosis Date  . Diabetes mellitus   . Hypertension   . High  cholesterol   . Osteomyelitis of right foot    Current Outpatient Prescriptions  Medication Sig Dispense Refill  . ACCU-CHEK FASTCLIX LANCETS MISC 1 each by Does not apply route 3 (three) times daily before meals. Insulin requiring dx code 250.00 102 each 11  . amitriptyline (ELAVIL) 25 MG tablet Take 1 tablet (25 mg total) by mouth at bedtime. 30 tablet 2  . Blood Glucose Monitoring Suppl (ACCU-CHEK NANO SMARTVIEW) W/DEVICE KIT 1 each by Does not apply route 2 (two) times daily. 1 kit 0  . fluconazole (DIFLUCAN) 150 MG tablet TAKE 1 TABLET (150 MG TOTAL) BY MOUTH ONCE. 1 tablet 1  . gabapentin (NEURONTIN) 300 MG capsule Take 300 mg by mouth 3 (three) times daily.    Marland Kitchen glucose blood (ACCU-CHEK SMARTVIEW) test strip 3 times daily before meals, 250.02, insulin requiring 100 each 11  . ibuprofen (ADVIL,MOTRIN) 800 MG tablet Take 1 tablet (800 mg total) by mouth 3 (three) times daily with meals. 21 tablet 0  . insulin aspart protamine- aspart (NOVOLOG MIX 70/30) (70-30) 100 UNIT/ML injection Inject 20 units with breakfast & 20 units with dinner each day 10 mL 4  . Insulin Syringe-Needle U-100 31G X 15/64" 0.3 ML MISC 1 each by Does not apply route 2 (two) times daily. Use to inject insulin twice daily dx code 250.02 100 each 11  . Insulin Syringes, Disposable, U-100 0.3 ML MISC Use twice daily to inject insulin, 250.00, insulin requiring 100 each 4  . lisinopril-hydrochlorothiazide (  PRINZIDE,ZESTORETIC) 20-12.5 MG per tablet Take 1 tablet by mouth daily. 30 tablet 2  . metFORMIN (GLUCOPHAGE) 1000 MG tablet Take 1 tablet (1,000 mg total) by mouth 2 (two) times daily. 180 tablet 2  . oxyCODONE-acetaminophen (PERCOCET) 5-325 MG per tablet Take 1-2 tablets by mouth every 6 (six) hours as needed. 20 tablet 0  . pravastatin (PRAVACHOL) 40 MG tablet Take 1 tablet (40 mg total) by mouth every evening. 90 tablet 3   No current facility-administered medications for this visit.   Family History  Problem  Relation Age of Onset  . Diabetes Mother   . Cancer Father    History   Social History  . Marital Status: Legally Separated    Spouse Name: N/A  . Number of Children: N/A  . Years of Education: 33   Social History Main Topics  . Smoking status: Former Smoker -- 0.20 packs/day    Types: Cigarettes    Quit date: 11/07/2013  . Smokeless tobacco: Former Systems developer    Quit date: 06/25/2014     Comment: plans to quit by Christmas, down to 1 cigarette/week  . Alcohol Use: Yes  . Drug Use: No  . Sexual Activity: Not on file   Other Topics Concern  . Not on file   Social History Narrative   Review of Systems: Review of Systems  Constitutional: Negative for fever and chills.  HENT: Negative for congestion.   Eyes: Positive for blurred vision (chronic).  Respiratory: Negative for cough and shortness of breath.   Cardiovascular: Negative for chest pain and leg swelling.  Gastrointestinal: Negative for nausea, vomiting, abdominal pain, diarrhea and constipation.  Genitourinary: Negative for dysuria, urgency and frequency.       Reducable right sided inguinal hernia   Musculoskeletal: Positive for myalgias and back pain (chronic low back pain). Negative for falls.  Neurological: Positive for sensory change (chronic peripheral neuropathy ). Negative for dizziness and headaches.  Endo/Heme/Allergies: Negative for polydipsia.  Psychiatric/Behavioral: Negative for depression.      Objective:  Physical Exam: Filed Vitals:   02/10/15 1438  BP: 139/75  Pulse: 83  Weight: 181 lb 11.2 oz (82.419 kg)   Physical Exam  Constitutional: She is oriented to person, place, and time. She appears well-developed and well-nourished. No distress.  HENT:  Head: Normocephalic and atraumatic.  Right Ear: External ear normal.  Left Ear: External ear normal.  Nose: Nose normal.  Mouth/Throat: Oropharynx is clear and moist. No oropharyngeal exudate.  Eyes: Conjunctivae and EOM are normal. Pupils are  equal, round, and reactive to light. Right eye exhibits no discharge. Left eye exhibits no discharge. No scleral icterus.  Neck: Normal range of motion. Neck supple.  Cardiovascular: Normal rate, regular rhythm and normal heart sounds.   Pulmonary/Chest: Effort normal and breath sounds normal. No respiratory distress. She has no wheezes. She has no rales.  Abdominal: Soft. Bowel sounds are normal. She exhibits no distension. There is no tenderness. There is no rebound and no guarding.  Musculoskeletal: Normal range of motion. She exhibits no edema or tenderness.  Right foot in cam boot  Neurological: She is alert and oriented to person, place, and time.  Skin: Skin is warm and dry. No rash noted. She is not diaphoretic. No erythema. No pallor.  Psychiatric: She has a normal mood and affect. Her behavior is normal. Judgment and thought content normal.    Assessment & Plan:   Please see problem list for problem-based assessment and plan

## 2015-02-10 NOTE — Patient Instructions (Addendum)
-  Increase your Novolog to 20 U twice a day and bring in your meter at next visit  -Increase your amitriptyline from 25 mg to 50 mg daily  -Will check your bloodwork today  -Will give you a flu shot today -Next time will refer you for eye exam, mammogram, and colonoscopy   -Will see you back in 6-8 weeks   General Instructions:   Please bring your medicines with you each time you come to clinic.  Medicines may include prescription medications, over-the-counter medications, herbal remedies, eye drops, vitamins, or other pills.   Progress Toward Treatment Goals:  Treatment Goal 12/30/2013  Hemoglobin A1C improved  Blood pressure improved    Self Care Goals & Plans:  Self Care Goal 10/14/2013  Manage my medications take my medicines as prescribed; refill my medications on time  Monitor my health check my feet daily  Eat healthy foods eat baked foods instead of fried foods; eat foods that are low in salt; drink diet soda or water instead of juice or soda  Be physically active find an activity I enjoy  Stop smoking cut down the number of cigarettes smoked; go to the [employer] website ([email or web address].eewww.quitlinenc.com)    Home Blood Glucose Monitoring 12/30/2013  Check my blood sugar 2 times a day     Care Management & Community Referrals:  No flowsheet data found.

## 2015-02-10 NOTE — Progress Notes (Signed)
Diabetes Self-Management Education  Visit Type:   annual follow up  Appt. Start Time: 3:10 PM Appt. End Time:3:45  02/10/2015  [name], identified by name and date of birth, is a 59 y.o. female with a diagnosis of Diabetes:Type 2 diabetes  .  Other people present during visit:No      ASSESSMENT  Patient has broken right foot in 5 places. She describes variations in adherence to meal plan, but for most part is choosing healthy foods. Smokes when she drinks on weekends making total abstinence from smoking more difficult. Not ready to quit today. Did not bring meter, ran out of strips. Ate bag of gummy worms candy today prior to visit. Denies that alcohol pr hypoglycemia is a problem or that either one was involved in her tripping and breaking her foot.   Subsequent Visit Information:  Since your last visit, have you continued or began the use of a meal plan?: Yes How many days a week are you following a meal plan?: 4 Since your last visit, have you continued or began to exercise on a consistent basis?: No Since your last visit have you continued or begun to take your medications as prescribed?: Yes Since your last visit have you had your blood pressure checked?: Yes Is your most recent blood pressure lower, unchanged, or higher since your last visit?: Unchanged Since your last visit are you checking your feet?: Yes How many days per week are you checking your feet?: 7 Since your last visit have you experienced any weight changes?: Loss Weight Loss (lbs): 5 Since your last visit, are you checking your blood glucose at least once a day?: Yes  Psychosocial:   Patient Belief/Attitude about Diabetes: Motivated to manage diabetes Self-care barriers: Lack of transportation, Lack of material resources, Other (comment) Self-management support: Doctor's office, Family, CDE visits Patient Concerns: Support Special Needs: None Preferred Learning Style: No preference  indicated Learning Readiness: Contemplating  Complications:   Fasting Blood glucose range (mg/dL): [id number] Postprandial Blood glucose range (mg/dL): [phone] Number of hypoglycemic episodes per month:  (0) Number of hyperglycemic episodes per week:  (1) Have you had a dilated eye exam in the past 12 months?: No Have you had a dental exam in the past 12 months?: No  Diet Intake:  Beverage(s): beer once a week, water, v-8 splash carrot juice  Exercise:  Exercise: ADL's   Individualized Plan for Diabetes Self-Management Training:   Learning Objective:  Patient will have a greater understanding of diabetes self-management.  Patient education plan per assessed needs and concerns is to attend follow up in 6 months to reassess readiness for dental exam/referral and smoking cessation    Education Topics Reviewed with Patient Today:   Dental care     PATIENTS GOALS/Plan (Developed by the patient):  Reducing Risk: think  About dental visit and reasons for stopping  Smoking altogether and reasons for continuing to keep smoking  Patient Self Evaluation of Goals - Patient rates self as meeting previously set goals:     Still >75% on meal planning most of the time  Plan:   There are no Patient Instructions on file for this visit.   Expected Outcomes:  Demonstrated interest in learning. Expect positive outcomes Education material provided: no If problems or questions, patient to contact team via:  Phone Future DSME appointment: - 6 months

## 2015-02-11 LAB — ANEMIA PANEL
%SAT: 21 % (ref 20–55)
ABS Retic: 34.6 10*3/uL (ref 19.0–186.0)
Ferritin: 60 ng/mL (ref 10–291)
Folate: >20 ng/mL
Iron: 70 ug/dL (ref 42–145)
RBC.: 5.77 MIL/uL — ABNORMAL HIGH (ref 3.87–5.11)
Retic Ct Pct: 0.6 % (ref 0.4–2.3)
TIBC: 331 ug/dL (ref 250–470)
UIBC: 261 ug/dL (ref 125–400)
VITAMIN B 12: 243 pg/mL (ref 211–911)

## 2015-02-11 LAB — HIV ANTIBODY (ROUTINE TESTING W REFLEX): HIV: NONREACTIVE

## 2015-02-11 MED ORDER — ATORVASTATIN CALCIUM 40 MG PO TABS: 40.0000 mg | ORAL_TABLET | Freq: Every day | ORAL | Status: DC

## 2015-02-11 MED ORDER — METFORMIN HCL 1000 MG PO TABS: 1000.0000 mg | ORAL_TABLET | Freq: Two times a day (BID) | ORAL | Status: DC

## 2015-02-11 NOTE — Assessment & Plan Note (Addendum)
-  Annual influenza vaccination given on 3/12/02/14. -Pt declined screening mammography and colonoscopy at this time, will inquire again at next visit.

## 2015-02-11 NOTE — Assessment & Plan Note (Signed)
Assessment: Pt with last A1c of 8.3 on 07/08/14 compliant with oral hypoglycemic and insulin therapy with no recent symptomatic hypoglycemia who presents with blood glucose of 306 and worsened A1c of 8.6.  Plan:  -A1 8.6 not at goal < 7,continue metformin 1000 mg BID and pt instructed to increase Novolog 70/30 from 20 U AM/ 15 U PM to 20 U BID which she did not do at last visit. Pt to return in 6-8 weeks for further adjustment with meter. -BP 139/75 at goal <140/90, continue lisinopril-HCTZ 20-12.5 mg daily    -Obtain annual lipid panel, LDL 165 not at goal <100, change pravastatin 40 mg daily to high intensity atorvastatin 40 mg daily   -Last annual foot exam on 07/08/14  -Last annual urine microalbumin on 07/08/14 was normal  -Last annual eye exam on 08/25/13 with mild hypertensive retinopathy, pt declined referral to opthalmology at this time  -Increase amitriptyline from 25 mg to 50 mg daily for chronic peripheral neuropathy -BMI 31.68 not at goal <30, encourage weight loss  -Pt met with Debera Lat today for diabetic counseling

## 2015-02-11 NOTE — Assessment & Plan Note (Signed)
Assessment: Pt with history of microcytic anemia since 2013 with no past anemia panel who is s/p hysterectomy without prior screening colonoscopy who presents with no active bleeding or hemodynamic instability.   Plan:  -Obtain CBC---> anemia resolved, Hg elevated at 15.2 with HCT 46.4 -Obtain anemia panel ----> mildly elevated RBC 5.77

## 2015-02-11 NOTE — Assessment & Plan Note (Signed)
Assessment: Pt with well-controlled hypertension compliant with two-class (ACEi & diuretic) anti-hypertensive therapy who presents with blood pressure of 139/75.   Plan:  -BP 139/75 not at goal <140/90 -Continue lisinopril-HCTZ 20-12.5 mg daily   -Obtain CMP --> GFR 75 consistent with CKD Stage 2

## 2015-02-11 NOTE — Assessment & Plan Note (Signed)
Assessment: Pt with current lipid panel on 02/11/15 with hypercholesteremia compliant with moderate-intensity statin therapy with 10-yr ASCVD risk of 16.3% with recommendations to start high intensity statin therapy.   Plan:  -Obtain annual lipid panel ---> LDL unchanged at 165 -Change pravastatin 40 mg daily to high intensity atorvastatin 40 mg daily  -Obtain CMP----> normal liver function  -Monitor for myalgias

## 2015-02-11 NOTE — Assessment & Plan Note (Signed)
Assessment: Pt with chronic peripheral polyneuropathy most likely due to diabetes who presents with moderately well controlled pain on TCA therapy.   Plan:  -Increase amitriptyline from 25 mg to 50 mg daily at bedtime  -Obtain HIV Ab ---> non-reactive   -Obtain B12 and folate levels ---> normal

## 2015-02-13 ENCOUNTER — Encounter: Payer: Self-pay | Admitting: *Deleted

## 2015-02-13 NOTE — Progress Notes (Signed)
Internal Medicine Clinic Attending  Case discussed with Dr. Rabbani soon after the resident saw the patient.  We reviewed the resident's history and exam and pertinent patient test results.  I agree with the assessment, diagnosis, and plan of care documented in the resident's note.  

## 2015-02-27 ENCOUNTER — Telehealth: Payer: Self-pay | Admitting: *Deleted

## 2015-02-27 NOTE — Telephone Encounter (Signed)
Pt called - had flu shot in clinic 02/10/15 - about 2-3 days after injection - sore throat, nose running, head congestion, grey productive cough, ribs and back hurts from coughing, occ chills and denies temp at this time. ? Temp few days after injection. Appt made 03/01/15 8:15AM with Dr Mikey BussingHoffman - will call pt if cancellation. Pt to call if any change before appt. Stanton KidneyDebra Tory Septer RN 02/27/15 10:40AM

## 2015-03-01 ENCOUNTER — Ambulatory Visit: Payer: Medicaid Other | Admitting: Internal Medicine

## 2015-03-01 ENCOUNTER — Encounter: Payer: Self-pay | Admitting: Internal Medicine

## 2015-03-02 ENCOUNTER — Encounter: Payer: Self-pay | Admitting: Internal Medicine

## 2015-03-02 ENCOUNTER — Other Ambulatory Visit: Payer: Self-pay | Admitting: Dietician

## 2015-03-02 DIAGNOSIS — E1142 Type 2 diabetes mellitus with diabetic polyneuropathy: Secondary | ICD-10-CM

## 2015-03-02 DIAGNOSIS — E11319 Type 2 diabetes mellitus with unspecified diabetic retinopathy without macular edema: Secondary | ICD-10-CM | POA: Insufficient documentation

## 2015-03-02 NOTE — Addendum Note (Signed)
Addended by: Baird CancerPLYLER, DONNA M on: 03/02/2015 11:22 AM   Modules accepted: Orders

## 2015-04-07 ENCOUNTER — Encounter: Payer: Medicaid Other | Admitting: Internal Medicine

## 2015-04-27 ENCOUNTER — Telehealth: Payer: Self-pay | Admitting: Internal Medicine

## 2015-04-27 NOTE — Telephone Encounter (Signed)
Call to patient to confirm appointment for 04/28/15 at 1:15 lmtcb

## 2015-04-28 ENCOUNTER — Ambulatory Visit (INDEPENDENT_AMBULATORY_CARE_PROVIDER_SITE_OTHER): Payer: Medicare Other | Admitting: Internal Medicine

## 2015-04-28 VITALS — BP 156/98 | HR 88 | Temp 98.1°F | Wt 188.5 lb

## 2015-04-28 DIAGNOSIS — S82831D Other fracture of upper and lower end of right fibula, subsequent encounter for closed fracture with routine healing: Secondary | ICD-10-CM

## 2015-04-28 DIAGNOSIS — E1122 Type 2 diabetes mellitus with diabetic chronic kidney disease: Secondary | ICD-10-CM | POA: Diagnosis not present

## 2015-04-28 DIAGNOSIS — I129 Hypertensive chronic kidney disease with stage 1 through stage 4 chronic kidney disease, or unspecified chronic kidney disease: Secondary | ICD-10-CM | POA: Diagnosis not present

## 2015-04-28 DIAGNOSIS — N182 Chronic kidney disease, stage 2 (mild): Secondary | ICD-10-CM | POA: Diagnosis not present

## 2015-04-28 DIAGNOSIS — S82401S Unspecified fracture of shaft of right fibula, sequela: Secondary | ICD-10-CM

## 2015-04-28 DIAGNOSIS — E1165 Type 2 diabetes mellitus with hyperglycemia: Secondary | ICD-10-CM

## 2015-04-28 DIAGNOSIS — I10 Essential (primary) hypertension: Secondary | ICD-10-CM

## 2015-04-28 DIAGNOSIS — S8251XD Displaced fracture of medial malleolus of right tibia, subsequent encounter for closed fracture with routine healing: Secondary | ICD-10-CM

## 2015-04-28 DIAGNOSIS — Z0000 Encounter for general adult medical examination without abnormal findings: Secondary | ICD-10-CM

## 2015-04-28 DIAGNOSIS — E1140 Type 2 diabetes mellitus with diabetic neuropathy, unspecified: Secondary | ICD-10-CM

## 2015-04-28 DIAGNOSIS — Z87891 Personal history of nicotine dependence: Secondary | ICD-10-CM

## 2015-04-28 DIAGNOSIS — E1142 Type 2 diabetes mellitus with diabetic polyneuropathy: Secondary | ICD-10-CM

## 2015-04-28 DIAGNOSIS — Z79899 Other long term (current) drug therapy: Secondary | ICD-10-CM

## 2015-04-28 DIAGNOSIS — X58XXXD Exposure to other specified factors, subsequent encounter: Secondary | ICD-10-CM

## 2015-04-28 DIAGNOSIS — G629 Polyneuropathy, unspecified: Secondary | ICD-10-CM

## 2015-04-28 DIAGNOSIS — Z794 Long term (current) use of insulin: Secondary | ICD-10-CM | POA: Diagnosis not present

## 2015-04-28 LAB — GLUCOSE, CAPILLARY: Glucose-Capillary: 389 mg/dL — ABNORMAL HIGH (ref 65–99)

## 2015-04-28 LAB — POCT GLYCOSYLATED HEMOGLOBIN (HGB A1C): Hemoglobin A1C: 9.6

## 2015-04-28 MED ORDER — ACETAMINOPHEN-CODEINE #3 300-30 MG PO TABS: 1.0000 | ORAL_TABLET | Freq: Three times a day (TID) | ORAL | Status: DC | PRN

## 2015-04-28 MED ORDER — ACCU-CHEK NANO SMARTVIEW W/DEVICE KIT: 1.0000 | PACK | Freq: Two times a day (BID) | Status: DC

## 2015-04-28 MED ORDER — GLUCOSE BLOOD VI STRP: ORAL_STRIP | Status: DC

## 2015-04-28 MED ORDER — INSULIN ASPART PROT & ASPART (70-30 MIX) 100 UNIT/ML ~~LOC~~ SUSP: SUBCUTANEOUS | Status: DC

## 2015-04-28 MED ORDER — "INSULIN SYRINGE-NEEDLE U-100 31G X 15/64"" 0.3 ML MISC": 1.0000 | Freq: Two times a day (BID) | Status: DC

## 2015-04-28 MED ORDER — AMITRIPTYLINE HCL 50 MG PO TABS: 100.0000 mg | ORAL_TABLET | Freq: Every day | ORAL | Status: DC

## 2015-04-28 MED ORDER — ACCU-CHEK FASTCLIX LANCETS MISC: 1.0000 | Freq: Three times a day (TID) | Status: DC

## 2015-04-28 NOTE — Patient Instructions (Signed)
-  Your A1c has gone up to 9.6 from 8.6 last time, please pick up your insulin, needles, and test strips from the pharmacy -Keep taking Novolog 20 U twice a day and metformin 1000 mg twice a day and check your sugar at home -Increase your amitriptyline from 50 mg daily to 100 mg daily at night -Take tylenol #3 every 8 hrs as needed for pain -Please pick up the lipitor 40 mg daily for high cholesterol  -Please come back in 2 to 4 weeks with your meter so we can change your insulin as necessary -Nice seeing you today!    General Instructions:   Please bring your medicines with you each time you come to clinic.  Medicines may include prescription medications, over-the-counter medications, herbal remedies, eye drops, vitamins, or other pills.   Progress Toward Treatment Goals:  Treatment Goal 12/30/2013  Hemoglobin A1C improved  Blood pressure improved    Self Care Goals & Plans:  Self Care Goal 10/14/2013  Manage my medications take my medicines as prescribed; refill my medications on time  Monitor my health check my feet daily  Eat healthy foods eat baked foods instead of fried foods; eat foods that are low in salt; drink diet soda or water instead of juice or soda  Be physically active find an activity I enjoy  Stop smoking cut down the number of cigarettes smoked; go to the [employer] website ([email or web address].eewww.quitlinenc.com)    Home Blood Glucose Monitoring 12/30/2013  Check my blood sugar 2 times a day     Care Management & Community Referrals:  No flowsheet data found.

## 2015-04-28 NOTE — Progress Notes (Signed)
Patient ID: Linda Miller, female   DOB: [birth year], 59 y.o.   MRN: [id number]     Subjective:   Patient ID: Linda Miller female   DOB: 02-05-1956 59 y.o.   MRN: [id number]  HPI: [name] is a 59 y.o. pleasant woman with past medical history of hypertension, hyperlipidemia, insulin-dependent T2DM, s/p right heel replacement, and recent right distal fibula and medial malleolus fractures who presents for follow-up visit of diabetes.  Her last A1c was 8.6 on 02/10/15. She reports checking her blood sugar 2 times daily and unfortunately lost her meter, supplies, and insulin due to her living situation for the past 5 days. She reports her average blood sugar is in the 100's. She is compliant with taking Novolog (70/30) 20 U BID and metformin 1000 mg BID. She denies symptomatic hypoglycemia. She has chronic polyuria, polyphagia, and polydipsia but denies blurry vision or foot ulcer. She is trying to follow a healthy diet but unfortunately unable to exercise due to her recent right ankle fracture. She has gained 4 lbs since last visit 3 months ago.   She has chronic peripheral neuropathy on amitriptyline which has also helps her mood. She would like to increase her dosage for better pain control.   She had right distal fibula and medial malleolus fractures after falling down the stairs in her sister's house. She has right LE pain and swelling. She would like to try tylenol with codeine which she reports she took from her mom recently which helped. She follows with orthopedic surgeon Dr. Berenice Primas.   She reports compliant with taking lisinopril-HCTZ for hypertension. She has occasional headache and LE edema but denies blurry vision, chest pain, or lightheadedness.  She reports compliant with taking atorvastatin for hyperlipidemia with mild muscle cramping.      Past Medical History  Diagnosis Date  . Diabetes mellitus   . Hypertension   . High cholesterol   . Osteomyelitis of  right foot    Current Outpatient Prescriptions  Medication Sig Dispense Refill  . ACCU-CHEK FASTCLIX LANCETS MISC 1 each by Does not apply route 3 (three) times daily before meals. Insulin requiring dx code 250.00 102 each 11  . amitriptyline (ELAVIL) 50 MG tablet Take 1 tablet (50 mg total) by mouth at bedtime. 30 tablet 5  . atorvastatin (LIPITOR) 40 MG tablet Take 1 tablet (40 mg total) by mouth daily. 30 tablet 5  . Blood Glucose Monitoring Suppl (ACCU-CHEK NANO SMARTVIEW) W/DEVICE KIT 1 each by Does not apply route 2 (two) times daily. 1 kit 0  . fluconazole (DIFLUCAN) 150 MG tablet TAKE 1 TABLET (150 MG TOTAL) BY MOUTH ONCE. 1 tablet 1  . glucose blood (ACCU-CHEK SMARTVIEW) test strip 3 times daily before meals, 250.02, insulin requiring 100 each 11  . ibuprofen (ADVIL,MOTRIN) 800 MG tablet Take 1 tablet (800 mg total) by mouth 3 (three) times daily with meals. 21 tablet 0  . insulin aspart protamine- aspart (NOVOLOG MIX 70/30) (70-30) 100 UNIT/ML injection Inject 20 units with breakfast & 20 units with dinner each day 10 mL 4  . Insulin Syringe-Needle U-100 31G X 15/64" 0.3 ML MISC 1 each by Does not apply route 2 (two) times daily. Use to inject insulin twice daily dx code 250.02 100 each 11  . Insulin Syringes, Disposable, U-100 0.3 ML MISC Use twice daily to inject insulin, 250.00, insulin requiring 100 each 4  . lisinopril-hydrochlorothiazide (PRINZIDE,ZESTORETIC) 20-12.5 MG per tablet Take 1 tablet by mouth daily. 30 tablet 2  .  metFORMIN (GLUCOPHAGE) 1000 MG tablet Take 1 tablet (1,000 mg total) by mouth 2 (two) times daily. 180 tablet 5   No current facility-administered medications for this visit.   Family History  Problem Relation Age of Onset  . Diabetes Mother   . Cancer Father    History   Social History  . Marital Status: Legally Separated    Spouse Name: N/A  . Number of Children: N/A  . Years of Education: 3   Social History Main Topics  . Smoking status:  Former Smoker -- 0.20 packs/day    Types: Cigarettes    Quit date: 11/07/2013  . Smokeless tobacco: Former Systems developer    Quit date: 06/25/2014     Comment: plans to quit by Christmas, down to 1 cigarette/week  . Alcohol Use: Yes  . Drug Use: No  . Sexual Activity: Not on file   Other Topics Concern  . Not on file   Social History Narrative   Review of Systems: Review of Systems  Constitutional: Negative for fever and chills.       Weight gain  HENT: Negative for sore throat.   Eyes: Negative for blurred vision.  Cardiovascular: Positive for leg swelling (right LE). Negative for chest pain.  Gastrointestinal: Negative for nausea, vomiting, abdominal pain, diarrhea, constipation and blood in stool.  Genitourinary: Negative for dysuria, urgency and frequency.  Musculoskeletal: Positive for myalgias.       Right LE pain  Neurological: Positive for sensory change (chronic peripheral neuropathy) and headaches. Negative for dizziness.  Endo/Heme/Allergies: Positive for polydipsia (chronic).  Psychiatric/Behavioral:       Decreased sex drive    Objective:  Physical Exam: Filed Vitals:   04/28/15 1317  BP: 156/98  Pulse: 88  Temp: 98.1 F (36.7 C)  TempSrc: Oral  Weight: 188 lb 8 oz (85.503 kg)  SpO2: 100%   Physical Exam  Constitutional: She is oriented to person, place, and time. She appears well-developed and well-nourished. No distress.  HENT:  Head: Normocephalic and atraumatic.  Right Ear: External ear normal.  Left Ear: External ear normal.  Nose: Nose normal.  Mouth/Throat: Oropharynx is clear and moist. No oropharyngeal exudate.  Eyes: Conjunctivae and EOM are normal. Pupils are equal, round, and reactive to light. Right eye exhibits no discharge. Left eye exhibits no discharge. No scleral icterus.  Neck: Normal range of motion. Neck supple.  Cardiovascular: Normal rate, regular rhythm and normal heart sounds.   Pulmonary/Chest: Effort normal and breath sounds normal.  No respiratory distress. She has no wheezes. She has no rales.  Abdominal: Soft. Bowel sounds are normal. She exhibits no distension. There is no tenderness. There is no rebound and no guarding.  Musculoskeletal: Normal range of motion. She exhibits edema (trace right LE) and tenderness (right distal LE).  Neurological: She is alert and oriented to person, place, and time.  Skin: Skin is warm and dry. No rash noted. She is not diaphoretic. No erythema. No pallor.  Psychiatric: She has a normal mood and affect. Her behavior is normal. Judgment and thought content normal.    Assessment & Plan:   Please see problem list for problem-based assessment and plan

## 2015-04-29 DIAGNOSIS — S82401A Unspecified fracture of shaft of right fibula, initial encounter for closed fracture: Secondary | ICD-10-CM | POA: Insufficient documentation

## 2015-04-29 NOTE — Assessment & Plan Note (Signed)
Assessment: Pt with chronic peripheral polyneuropathy in setting of uncontrolled diabetes who presents with moderately well controlled pain on TCA therapy.   Plan:  -Increase amitriptyline from 50 mg daily to 100 mg daily at bedtime

## 2015-04-29 NOTE — Assessment & Plan Note (Signed)
Pt declines screening mammography and colonoscopy at this time, inquire again at next visit.

## 2015-04-29 NOTE — Assessment & Plan Note (Signed)
Assessment: Pt with hypertension with questionable compliance with two-class (ACEi and diuretic) anti-hypertensive therapy who presents with blood pressure of 156/98.   Plan:  -BP 156/98 not at goal <140/90, pt without medication for past 5 days -Continue lisinopril-HCTZ 20-12.5 mg daily  -Last CMP on 02/10/15 with stable CKD Stage 2

## 2015-04-29 NOTE — Assessment & Plan Note (Addendum)
Assessment: Pt with recent right distal fibula and medial malleolus fractures in December 2015 who presents with right LE pain and mild edema.   Plan:  -Prescribe trial of tylenol #3 Q8 hr PRN pain  (codeine listed as allergy however pt reports taking it recently without side effect) -Pt has upcoming appointment with orthopedic surgeon Dr. Luiz BlareGraves

## 2015-04-29 NOTE — Assessment & Plan Note (Addendum)
Assessment: Pt with last A1c of 8.6 on 02/10/15 with questionable compliance with oral hypoglycemic and insulin therapy with no recent symptomatic hypoglycemia who presents with CBG of of 389 and worsened A1c of 9.6.  Plan:  -A1 9.6 not at goal < 7,continue metformin 1000 mg BID and Novolog 70/30 20 U BID, refilled insulin and supplies which she reports being without for the past 5 days. Pt to return in 2 weeks with meter for further insulin adjustment.  -BP 156/98 not at goal <140/90, continue lisinopril-HCTZ 20-12.5 mg daily(pt without medication for past 5 days)   -LDL 165 not at goal <100, continue atorvastatin 40 mg daily  -Last annual foot exam on 07/08/14  -Last annual urine microalbumin on 07/08/14 was normal  -Last annual eye exam on 02/10/15 with right mild NPDR -Increase amitriptyline from 50 mg daily to 100 mg daily for chronic peripheral neuropathy -BMI 32.86 not at goal <30, encourage weight loss

## 2015-05-02 NOTE — Progress Notes (Signed)
Internal Medicine Clinic Attending  Case discussed with Dr. Rabbani soon after the resident saw the patient.  We reviewed the resident's history and exam and pertinent patient test results.  I agree with the assessment, diagnosis, and plan of care documented in the resident's note.  

## 2015-05-05 MED ORDER — ACCU-CHEK NANO SMARTVIEW W/DEVICE KIT: 1.0000 | PACK | Freq: Two times a day (BID) | Status: AC

## 2015-05-05 NOTE — Addendum Note (Signed)
Addended byOtis Brace on: 05/05/2015 03:15 PM   Modules accepted: Orders

## 2015-05-16 DIAGNOSIS — M25571 Pain in right ankle and joints of right foot: Secondary | ICD-10-CM | POA: Diagnosis not present

## 2015-05-30 ENCOUNTER — Other Ambulatory Visit: Payer: Self-pay | Admitting: Internal Medicine

## 2015-05-31 NOTE — Telephone Encounter (Signed)
Called to pharm 

## 2015-06-01 ENCOUNTER — Other Ambulatory Visit: Payer: Self-pay | Admitting: *Deleted

## 2015-06-01 DIAGNOSIS — E1122 Type 2 diabetes mellitus with diabetic chronic kidney disease: Secondary | ICD-10-CM

## 2015-06-01 DIAGNOSIS — N182 Chronic kidney disease, stage 2 (mild): Principal | ICD-10-CM

## 2015-06-02 MED ORDER — "INSULIN SYRINGE-NEEDLE U-100 31G X 15/64"" 0.3 ML MISC": 1.0000 | Freq: Two times a day (BID) | Status: DC

## 2015-06-02 MED ORDER — GLUCOSE BLOOD VI STRP: ORAL_STRIP | Status: DC

## 2015-06-02 MED ORDER — ACCU-CHEK FASTCLIX LANCETS MISC: 1.0000 | Freq: Three times a day (TID) | Status: DC

## 2015-06-05 ENCOUNTER — Other Ambulatory Visit: Payer: Self-pay | Admitting: *Deleted

## 2015-06-05 DIAGNOSIS — N182 Chronic kidney disease, stage 2 (mild): Principal | ICD-10-CM

## 2015-06-05 DIAGNOSIS — E1122 Type 2 diabetes mellitus with diabetic chronic kidney disease: Secondary | ICD-10-CM

## 2015-06-05 MED ORDER — ACCU-CHEK FASTCLIX LANCETS MISC: Status: DC

## 2015-06-06 ENCOUNTER — Other Ambulatory Visit: Payer: Self-pay | Admitting: *Deleted

## 2015-06-06 DIAGNOSIS — N182 Chronic kidney disease, stage 2 (mild): Principal | ICD-10-CM

## 2015-06-06 DIAGNOSIS — E1122 Type 2 diabetes mellitus with diabetic chronic kidney disease: Secondary | ICD-10-CM

## 2015-06-06 MED ORDER — ACCU-CHEK FASTCLIX LANCETS MISC: Status: DC

## 2015-06-08 ENCOUNTER — Telehealth: Payer: Self-pay | Admitting: Dietician

## 2015-06-08 NOTE — Telephone Encounter (Addendum)
Call to patient to confirm appointment for 06/09/15 at 1:30 and 3:15 lmtcb

## 2015-06-09 ENCOUNTER — Encounter: Payer: Self-pay | Admitting: Dietician

## 2015-06-09 ENCOUNTER — Ambulatory Visit (INDEPENDENT_AMBULATORY_CARE_PROVIDER_SITE_OTHER): Payer: Medicaid Other | Admitting: Dietician

## 2015-06-09 ENCOUNTER — Encounter: Payer: Medicaid Other | Admitting: Internal Medicine

## 2015-06-09 VITALS — Wt 193.5 lb

## 2015-06-09 DIAGNOSIS — E669 Obesity, unspecified: Secondary | ICD-10-CM

## 2015-06-09 DIAGNOSIS — Z713 Dietary counseling and surveillance: Secondary | ICD-10-CM | POA: Diagnosis not present

## 2015-06-09 DIAGNOSIS — E119 Type 2 diabetes mellitus without complications: Secondary | ICD-10-CM

## 2015-06-09 DIAGNOSIS — E1122 Type 2 diabetes mellitus with diabetic chronic kidney disease: Secondary | ICD-10-CM

## 2015-06-09 DIAGNOSIS — Z6833 Body mass index (BMI) 33.0-33.9, adult: Secondary | ICD-10-CM

## 2015-06-09 DIAGNOSIS — N182 Chronic kidney disease, stage 2 (mild): Principal | ICD-10-CM

## 2015-06-09 LAB — GLUCOSE, CAPILLARY: Glucose-Capillary: 217 mg/dL — ABNORMAL HIGH (ref 65–99)

## 2015-06-09 NOTE — Progress Notes (Signed)
Diabetes Self-Management Education  Visit Type:  Follow-up  Appt. Start Time: 1330   Appt. End Time: 1430  06/09/2015  [name], identified by name and date of birth, is a 59 y.o. female with a diagnosis of Diabetes:  .Type 2    ASSESSMENT  Weight 193 lb 8 oz (87.771 kg). Body mass index is 33.74 kg/(m^2).    Subsequent Visit Information:  Since your last visit, have you continued or began the use of a meal plan?: Yes How many days a week are you following a meal plan?: 5 Since your last visit, have you continued or began to exercise on a consistent basis?: Yes How many days per week are you exercising or participating in a physicial activity for more than 20 minutes?: 5 Since your last visit have you continued or begun to take your medications as prescribed?: Yes Since your last visit have you had your blood pressure checked?: No Since your last visit are you checking your feet?: Yes How many days per week are you checking your feet?: 7 (patient was wearing sandals we discussed appropaite footwear) Since your last visit have you experienced any weight changes?: Gain Weight Gain (lbs): 12 Since your last visit, are you checking your blood glucose at least once a day?: Yes  Psychosocial:   Patient Belief/Attitude about Diabetes: Motivated to manage diabetes Self-care barriers: Lack of material resources, competing values Self-management support: Doctor's office, Family, CDE visits Patient Concerns: Glycemic Control  Complications:     history of amputation for foot infection,   Diet Intake:  Breakfast: toast egg sausage or toast, egs and fruit Snack (morning): sugar wafers cookies Lunch: vegetables fron the garden, steamed or baked meat like chicken of chicken livers Snack (afternoon): fruit Dinner: brown rice, chicken livers, garlic bread, cabbage Snack (evening): salad with cabbage , pepper an dlemon  Exercise:  Exercise: Light (walking / raking  leaves)   Individualized Plan for Diabetes Self-Management Training:   Learning Objective:  Patient will have a greater understanding of diabetes self-management.  Patient education plan per assessed needs and concerns is to attend individual sessions for    Education Topics Reviewed with Patient Today:        Other (comment) (dicussed basal bolus insulin therapy and GLP-1s)     Dental care        PATIENTS GOALS/Plan (Developed by the patient):  Reducing Risk: stop smoking  Patient Self Evaluation of Goals - Patient rates self as meeting previously set goals:   Reducing Risk: 50 - 75 % (had less than 1 cigarette per day in past 2 weeks)   Plan:   Patient to continue to work on smoking less and getting dental exam done  Expected Outcomes:  Demonstrated interest in learning. Expect positive outcomes  Education material provided: meter download and information about incretins/GLP-1s If problems or questions, patient to contact team via:  Phone Future DSME appointment: - 2 months

## 2015-06-15 ENCOUNTER — Telehealth: Payer: Self-pay | Admitting: Internal Medicine

## 2015-06-15 NOTE — Telephone Encounter (Signed)
Calling patient to confirm appointment for 06/16/15 at 2:15 lmtcb

## 2015-06-16 ENCOUNTER — Encounter: Payer: Medicaid Other | Admitting: Internal Medicine

## 2015-06-22 ENCOUNTER — Telehealth: Payer: Self-pay | Admitting: Internal Medicine

## 2015-06-22 NOTE — Telephone Encounter (Signed)
Call to patient to confirm appointment for 06/23/15 at 3:15 lmtcb ° °

## 2015-06-23 ENCOUNTER — Encounter: Payer: Medicaid Other | Admitting: Internal Medicine

## 2015-06-30 ENCOUNTER — Encounter: Payer: Medicaid Other | Admitting: Internal Medicine

## 2015-06-30 ENCOUNTER — Encounter: Payer: Self-pay | Admitting: Internal Medicine

## 2015-07-14 ENCOUNTER — Encounter: Payer: Medicare Other | Admitting: Internal Medicine

## 2015-07-17 ENCOUNTER — Ambulatory Visit: Payer: Medicare Other | Admitting: Internal Medicine

## 2015-08-04 ENCOUNTER — Ambulatory Visit: Payer: Medicare Other | Admitting: Internal Medicine

## 2015-08-11 ENCOUNTER — Encounter: Payer: Self-pay | Admitting: Internal Medicine

## 2015-08-11 ENCOUNTER — Ambulatory Visit (INDEPENDENT_AMBULATORY_CARE_PROVIDER_SITE_OTHER): Payer: Medicare Other | Admitting: Internal Medicine

## 2015-08-11 VITALS — BP 144/84 | HR 88 | Temp 98.7°F | Wt 195.1 lb

## 2015-08-11 DIAGNOSIS — E11329 Type 2 diabetes mellitus with mild nonproliferative diabetic retinopathy without macular edema: Secondary | ICD-10-CM | POA: Diagnosis not present

## 2015-08-11 DIAGNOSIS — N182 Chronic kidney disease, stage 2 (mild): Secondary | ICD-10-CM | POA: Diagnosis not present

## 2015-08-11 DIAGNOSIS — I129 Hypertensive chronic kidney disease with stage 1 through stage 4 chronic kidney disease, or unspecified chronic kidney disease: Secondary | ICD-10-CM

## 2015-08-11 DIAGNOSIS — E1142 Type 2 diabetes mellitus with diabetic polyneuropathy: Secondary | ICD-10-CM | POA: Diagnosis not present

## 2015-08-11 DIAGNOSIS — E669 Obesity, unspecified: Secondary | ICD-10-CM | POA: Diagnosis not present

## 2015-08-11 DIAGNOSIS — Z0000 Encounter for general adult medical examination without abnormal findings: Secondary | ICD-10-CM

## 2015-08-11 DIAGNOSIS — E1165 Type 2 diabetes mellitus with hyperglycemia: Secondary | ICD-10-CM

## 2015-08-11 DIAGNOSIS — Z6834 Body mass index (BMI) 34.0-34.9, adult: Secondary | ICD-10-CM

## 2015-08-11 DIAGNOSIS — Z794 Long term (current) use of insulin: Secondary | ICD-10-CM | POA: Diagnosis not present

## 2015-08-11 DIAGNOSIS — E1122 Type 2 diabetes mellitus with diabetic chronic kidney disease: Secondary | ICD-10-CM | POA: Diagnosis not present

## 2015-08-11 DIAGNOSIS — I10 Essential (primary) hypertension: Secondary | ICD-10-CM

## 2015-08-11 LAB — GLUCOSE, CAPILLARY: Glucose-Capillary: 90 mg/dL (ref 65–99)

## 2015-08-11 LAB — POCT GLYCOSYLATED HEMOGLOBIN (HGB A1C): Hemoglobin A1C: 9

## 2015-08-11 MED ORDER — INSULIN ASPART PROT & ASPART (70-30 MIX) 100 UNIT/ML ~~LOC~~ SUSP: SUBCUTANEOUS | Status: DC

## 2015-08-11 MED ORDER — GLUCOSE BLOOD VI STRP: ORAL_STRIP | Status: DC

## 2015-08-11 NOTE — Assessment & Plan Note (Signed)
Assessment: Pt with hypertension compliant with two-class (ACEi and diuretic) anti-hypertensive therapy who presents with improved blood pressure of 144/84.   Plan:  -BP 144/84 near goal <140/90 -Continue lisinopril-HCTZ 20-12.5 mg daily  -Last CMP on 02/10/15 with stable CKD Stage 2, repeat at next visit

## 2015-08-11 NOTE — Patient Instructions (Signed)
-  Great job on improving your A1c to 9 from 9.6! -Increase your insulin to 22 U twice a day and keep taking metformin  -Will give you stool cards, please send them back to [place] -Please come back in 2-3 months for diabetes follow-up, nice seeing you!  General Instructions:   Please bring your medicines with you each time you come to clinic.  Medicines may include prescription medications, over-the-counter medications, herbal remedies, eye drops, vitamins, or other pills.   Progress Toward Treatment Goals:  Treatment Goal 12/30/2013  Hemoglobin A1C improved  Blood pressure improved    Self Care Goals & Plans:  Self Care Goal 04/28/2015  Manage my medications take my medicines as prescribed; bring my medications to every visit; refill my medications on time  Monitor my health bring my glucose meter and log to each visit; keep track of my blood glucose; check my feet daily  Eat healthy foods eat foods that are low in salt; eat baked foods instead of fried foods  Be physically active -  Stop smoking -  Prevent falls use home fall prevention checklist to improve safety    Home Blood Glucose Monitoring 12/30/2013  Check my blood sugar 2 times a day     Care Management & Community Referrals:  No flowsheet data found.

## 2015-08-11 NOTE — Assessment & Plan Note (Signed)
Assessment: Pt with last A1c of 9.6 on 04/28/15 compliant with oral hypoglycemic and insulin therapy with no recent symptomatic hypoglycemia who presents with CBG of 90 and improved A1c of 9.  Plan:  -A1 9 not at goal < 7,increase Novolog 70/30 20 U BID to 22 U BID and continue metformin 1000 mg BID  -BP 144/84 near goal <140/90, continue lisinopril-HCTZ 20-12.5 mg daily  -LDL 165 not at goal <100, continue atorvastatin 40 mg daily  -Perform annual foot exam   -Obtain annual urine microalbumin   -Last annual eye exam on 02/10/15 with right mild NPDR -Continue amitriptyline 100 mg daily for chronic peripheral neuropathy -BMI 34.01 not at goal <25, encourage weight loss

## 2015-08-11 NOTE — Progress Notes (Signed)
Internal Medicine Clinic Attending  Case discussed with Dr. Rabbani soon after the resident saw the patient.  We reviewed the resident's history and exam and pertinent patient test results.  I agree with the assessment, diagnosis, and plan of care documented in the resident's note.  

## 2015-08-11 NOTE — Assessment & Plan Note (Signed)
-  Pt declines screening mammography and colonoscopy at this time -Pt agreeable to home stool card testing  -Pt declines annual influenza vaccination  -Obtain screening HCV Ab at next visit

## 2015-08-11 NOTE — Progress Notes (Signed)
Patient ID: Linda Miller, female   DOB: Apr 27, 1956, 59 y.o.   MRN: [id number]    Subjective:   Patient ID: Linda Miller female   DOB: 1955/12/31 59 y.o.   MRN: [id number]  HPI: [name] is a 59 y.o.  pleasant woman with past medical history of hypertension, hyperlipidemia, insulin-dependent T2DM,s/p right heel replacement, and  right distal fibula and medial malleolus fractures who presents for follow-up visit of diabetes.  Her last A1c was 9.6 on 04/28/15. She reports checking her blood sugar 2 times daily and brought her glucose meter which reveals blood glucose range of 82-347 with average of 250. She is compliant with taking Novolog (70/30) 20 U BID and metformin 1000 mg BID. She denies symptomatic hypoglycemia. She has chronic polyuria, polyphagia, and polydipsia but denies blurry vision or foot injury/ulcer. She has chronic peripheral neuropathy moderately controlled on amitriptyline. She is trying to follow a healthy diet and exercise. She has gained 7  lbs since last visit 2 months ago.   She reports compliance with taking lisinopril-HCTZ for hypertension. She has occasional headache and LE edema but denies blurry vision, chest pain, or lightheadedness.  She declines flu shot or colonoscopy but agreeable to stool card testing.     Past Medical History  Diagnosis Date  . Diabetes mellitus   . Hypertension   . High cholesterol   . Osteomyelitis of right foot    Current Outpatient Prescriptions  Medication Sig Dispense Refill  . ACCU-CHEK FASTCLIX LANCETS MISC Use to check blood sugar 3 to 4 times daily.Insulin requiring.  dx code E11.22 102 each 6  . acetaminophen-codeine (TYLENOL #3) 300-30 MG per tablet TAKE 1 TABLET BY MOUTH EVERY 8 HOURS AS NEEDED FOR MODERATE PAIN 30 tablet 2  . amitriptyline (ELAVIL) 50 MG tablet Take 2 tablets (100 mg total) by mouth at bedtime. 30 tablet 5  . atorvastatin (LIPITOR) 40 MG tablet Take 1 tablet (40 mg total) by mouth daily. 30  tablet 5  . Blood Glucose Monitoring Suppl (ACCU-CHEK NANO SMARTVIEW) W/DEVICE KIT 1 each by Does not apply route 2 (two) times daily. 1 kit 0  . glucose blood (ACCU-CHEK SMARTVIEW) test strip 3 to 4  times daily before meals, diag code E11.22, insulin requiring 100 each 6  . insulin aspart protamine- aspart (NOVOLOG MIX 70/30) (70-30) 100 UNIT/ML injection Inject 20 units with breakfast & 20 units with dinner each day 10 mL 4  . Insulin Syringe-Needle U-100 31G X 15/64" 0.3 ML MISC 1 each by Does not apply route 2 (two) times daily. Use to inject insulin twice daily dx code E11.22. Insulin dependent 100 each 6  . lisinopril-hydrochlorothiazide (PRINZIDE,ZESTORETIC) 20-12.5 MG per tablet Take 1 tablet by mouth daily. 30 tablet 2  . metFORMIN (GLUCOPHAGE) 1000 MG tablet Take 1 tablet (1,000 mg total) by mouth 2 (two) times daily. 180 tablet 5   No current facility-administered medications for this visit.   Family History  Problem Relation Age of Onset  . Diabetes Mother   . Cancer Father    Social History   Social History  . Marital Status: Legally Separated    Spouse Name: N/A  . Number of Children: N/A  . Years of Education: 58   Social History Main Topics  . Smoking status: Former Smoker -- 0.20 packs/day    Types: Cigarettes    Quit date: 11/07/2013  . Smokeless tobacco: Former Systems developer    Quit date: 06/25/2014     Comment: plans to quit by  Christmas, down to 1 cigarette/week  . Alcohol Use: Yes  . Drug Use: No  . Sexual Activity: Not Asked   Other Topics Concern  . None   Social History Narrative   Review of Systems: Review of Systems  Constitutional:       Weight gain  HENT: Positive for ear pain (right).        Right sided tooth pain  Eyes: Negative for blurred vision.  Respiratory: Positive for cough. Negative for shortness of breath and wheezing.   Cardiovascular: Negative for chest pain and leg swelling.  Gastrointestinal: Negative for nausea, vomiting, abdominal  pain, diarrhea and constipation.  Genitourinary: Negative for dysuria, urgency, frequency and hematuria.       Vaginal itching one month ago, chronic polyuria  Musculoskeletal: Positive for back pain. Negative for myalgias.  Neurological: Positive for headaches (occasionally ). Negative for dizziness.  Endo/Heme/Allergies: Positive for polydipsia (chronic).  Psychiatric/Behavioral: Negative for depression.    Objective:  Physical Exam: Filed Vitals:   08/11/15 1620  BP: 144/84  Pulse: 88  Temp: 98.7 F (37.1 C)  TempSrc: Oral  Weight: 195 lb 1.6 oz (88.497 kg)  SpO2: 100%    Physical Exam  Constitutional: She is oriented to person, place, and time. She appears well-developed and well-nourished. No distress.  HENT:  Head: Normocephalic and atraumatic.  Right Ear: External ear normal.  Left Ear: External ear normal.  Nose: Nose normal.  Mouth/Throat: Oropharynx is clear and moist. No oropharyngeal exudate.  Normal b/l tympanic membranes with no sign of infection  Eyes: Conjunctivae and EOM are normal. Pupils are equal, round, and reactive to light. Right eye exhibits no discharge. Left eye exhibits no discharge. No scleral icterus.  Neck: Normal range of motion. Neck supple.  Cardiovascular: Normal rate, regular rhythm and normal heart sounds.   Pulmonary/Chest: Effort normal and breath sounds normal. No respiratory distress. She has no wheezes. She has no rales.  Abdominal: Soft. Bowel sounds are normal. She exhibits no distension. There is no tenderness. There is no rebound and no guarding.  Musculoskeletal: Normal range of motion. She exhibits no edema or tenderness.  Neurological: She is alert and oriented to person, place, and time.  Skin: Skin is warm and dry. No rash noted. She is not diaphoretic. No erythema. No pallor.  Psychiatric: She has a normal mood and affect. Her behavior is normal. Judgment and thought content normal.    Assessment & Plan:   Please see  problem list for problem-based assessment and plan

## 2015-08-12 LAB — MICROALBUMIN / CREATININE URINE RATIO
Creatinine, Urine: 236.9 mg/dL
MICROALB/CREAT RATIO: 34.9 mg/g creat — ABNORMAL HIGH (ref 0.0–30.0)
Microalbumin, Urine: 82.6 ug/mL

## 2015-08-16 ENCOUNTER — Other Ambulatory Visit (INDEPENDENT_AMBULATORY_CARE_PROVIDER_SITE_OTHER): Payer: Medicare Other

## 2015-08-16 DIAGNOSIS — Z1211 Encounter for screening for malignant neoplasm of colon: Secondary | ICD-10-CM

## 2015-08-16 DIAGNOSIS — Z0000 Encounter for general adult medical examination without abnormal findings: Secondary | ICD-10-CM

## 2015-08-16 LAB — POC HEMOCCULT BLD/STL (HOME/3-CARD/SCREEN)
Card #2 Fecal Occult Blod, POC: NEGATIVE
FECAL OCCULT BLD: NEGATIVE
FECAL OCCULT BLD: NEGATIVE

## 2015-10-30 ENCOUNTER — Encounter: Payer: Self-pay | Admitting: Internal Medicine

## 2015-10-30 ENCOUNTER — Ambulatory Visit: Payer: Medicare Other | Admitting: Internal Medicine

## 2015-11-02 ENCOUNTER — Other Ambulatory Visit: Payer: Self-pay | Admitting: Internal Medicine

## 2015-11-07 ENCOUNTER — Telehealth: Payer: Self-pay | Admitting: Internal Medicine

## 2015-11-07 NOTE — Telephone Encounter (Signed)
Please call patient back about her foot. Patient offered an appointment with another Dr. But, patient insisted she needed to speak to a nurse first.

## 2015-11-10 ENCOUNTER — Other Ambulatory Visit: Payer: Self-pay | Admitting: Internal Medicine

## 2015-11-16 ENCOUNTER — Ambulatory Visit: Payer: Medicare Other | Admitting: Internal Medicine

## 2015-11-16 ENCOUNTER — Encounter: Payer: Self-pay | Admitting: Internal Medicine

## 2015-12-01 ENCOUNTER — Inpatient Hospital Stay (HOSPITAL_COMMUNITY)
Admission: EM | Admit: 2015-12-01 | Discharge: 2015-12-01 | DRG: 641 | Discharge status: Against Medical Advice | Payer: Medicare Other | Attending: Student in an Organized Health Care Education/Training Program | Admitting: Student in an Organized Health Care Education/Training Program

## 2015-12-01 ENCOUNTER — Encounter (HOSPITAL_COMMUNITY): Payer: Self-pay

## 2015-12-01 ENCOUNTER — Other Ambulatory Visit: Payer: Self-pay | Admitting: Internal Medicine

## 2015-12-01 ENCOUNTER — Emergency Department (HOSPITAL_COMMUNITY): Payer: Medicare Other

## 2015-12-01 ENCOUNTER — Ambulatory Visit: Payer: Medicare Other | Admitting: Pulmonary Disease

## 2015-12-01 DIAGNOSIS — E11319 Type 2 diabetes mellitus with unspecified diabetic retinopathy without macular edema: Secondary | ICD-10-CM | POA: Diagnosis present

## 2015-12-01 DIAGNOSIS — E8770 Fluid overload, unspecified: Principal | ICD-10-CM | POA: Diagnosis present

## 2015-12-01 DIAGNOSIS — Z79899 Other long term (current) drug therapy: Secondary | ICD-10-CM | POA: Diagnosis not present

## 2015-12-01 DIAGNOSIS — E7800 Pure hypercholesterolemia, unspecified: Secondary | ICD-10-CM | POA: Diagnosis present

## 2015-12-01 DIAGNOSIS — E785 Hyperlipidemia, unspecified: Secondary | ICD-10-CM | POA: Diagnosis present

## 2015-12-01 DIAGNOSIS — Z7984 Long term (current) use of oral hypoglycemic drugs: Secondary | ICD-10-CM

## 2015-12-01 DIAGNOSIS — R0600 Dyspnea, unspecified: Secondary | ICD-10-CM

## 2015-12-01 DIAGNOSIS — Z794 Long term (current) use of insulin: Secondary | ICD-10-CM

## 2015-12-01 DIAGNOSIS — N182 Chronic kidney disease, stage 2 (mild): Secondary | ICD-10-CM

## 2015-12-01 DIAGNOSIS — I10 Essential (primary) hypertension: Secondary | ICD-10-CM | POA: Diagnosis present

## 2015-12-01 DIAGNOSIS — R0902 Hypoxemia: Secondary | ICD-10-CM | POA: Diagnosis present

## 2015-12-01 DIAGNOSIS — E1122 Type 2 diabetes mellitus with diabetic chronic kidney disease: Secondary | ICD-10-CM | POA: Diagnosis present

## 2015-12-01 DIAGNOSIS — D509 Iron deficiency anemia, unspecified: Secondary | ICD-10-CM | POA: Diagnosis present

## 2015-12-01 DIAGNOSIS — E1169 Type 2 diabetes mellitus with other specified complication: Secondary | ICD-10-CM | POA: Diagnosis present

## 2015-12-01 DIAGNOSIS — Z87891 Personal history of nicotine dependence: Secondary | ICD-10-CM | POA: Diagnosis not present

## 2015-12-01 DIAGNOSIS — R000 Tachycardia, unspecified: Secondary | ICD-10-CM | POA: Diagnosis not present

## 2015-12-01 DIAGNOSIS — M869 Osteomyelitis, unspecified: Secondary | ICD-10-CM | POA: Diagnosis present

## 2015-12-01 DIAGNOSIS — R0602 Shortness of breath: Secondary | ICD-10-CM | POA: Diagnosis not present

## 2015-12-01 DIAGNOSIS — G629 Polyneuropathy, unspecified: Secondary | ICD-10-CM

## 2015-12-01 DIAGNOSIS — R05 Cough: Secondary | ICD-10-CM | POA: Diagnosis not present

## 2015-12-01 LAB — COMPREHENSIVE METABOLIC PANEL
ALK PHOS: 78 U/L (ref 38–126)
ALT: 12 U/L — AB (ref 14–54)
AST: 16 U/L (ref 15–41)
Albumin: 3.2 g/dL — ABNORMAL LOW (ref 3.5–5.0)
Anion gap: 11 (ref 5–15)
BILIRUBIN TOTAL: 0.3 mg/dL (ref 0.3–1.2)
BUN: 10 mg/dL (ref 6–20)
CALCIUM: 9.1 mg/dL (ref 8.9–10.3)
CO2: 23 mmol/L (ref 22–32)
Chloride: 104 mmol/L (ref 101–111)
Creatinine, Ser: 0.93 mg/dL (ref 0.44–1.00)
GFR calc Af Amer: >60 mL/min (ref >60)
GLUCOSE: 402 mg/dL — AB (ref 65–99)
Potassium: 4.3 mmol/L (ref 3.5–5.1)
Sodium: 138 mmol/L (ref 135–145)
Total Protein: 7.2 g/dL (ref 6.5–8.1)

## 2015-12-01 LAB — BRAIN NATRIURETIC PEPTIDE: B NATRIURETIC PEPTIDE 5: 308.8 pg/mL — AB (ref 0.0–100.0)

## 2015-12-01 LAB — CBC WITH DIFFERENTIAL/PLATELET
BASOS ABS: 0 10*3/uL (ref 0.0–0.1)
BASOS PCT: 0 %
Eosinophils Absolute: 0.2 10*3/uL (ref 0.0–0.7)
Eosinophils Relative: 1 %
HEMATOCRIT: 36.4 % (ref 36.0–46.0)
HEMOGLOBIN: 11.5 g/dL — AB (ref 12.0–15.0)
LYMPHS PCT: 15 %
Lymphs Abs: 3 10*3/uL (ref 0.7–4.0)
MCH: 24.8 pg — ABNORMAL LOW (ref 26.0–34.0)
MCHC: 31.6 g/dL (ref 30.0–36.0)
MCV: 78.4 fL (ref 78.0–100.0)
MONO ABS: 1.4 10*3/uL — AB (ref 0.1–1.0)
MONOS PCT: 7 %
NEUTROS ABS: 14.9 10*3/uL — AB (ref 1.7–7.7)
NEUTROS PCT: 77 %
Platelets: 348 10*3/uL (ref 150–400)
RBC: 4.64 MIL/uL (ref 3.87–5.11)
RDW: 14.8 % (ref 11.5–15.5)
WBC: 19.5 10*3/uL — ABNORMAL HIGH (ref 4.0–10.5)

## 2015-12-01 LAB — TROPONIN I: Troponin I: <0.03 ng/mL (ref <0.031)

## 2015-12-01 LAB — CBG MONITORING, ED: GLUCOSE-CAPILLARY: 346 mg/dL — AB (ref 65–99)

## 2015-12-01 MED ORDER — ALBUTEROL SULFATE (2.5 MG/3ML) 0.083% IN NEBU
5.0000 mg | INHALATION_SOLUTION | Freq: Once | RESPIRATORY_TRACT | Status: AC
  Administered 2015-12-01: 5 mg via RESPIRATORY_TRACT
  Filled 2015-12-01: qty 6

## 2015-12-01 MED ORDER — ALBUTEROL SULFATE HFA 108 (90 BASE) MCG/ACT IN AERS: 1.0000 | INHALATION_SPRAY | Freq: Four times a day (QID) | RESPIRATORY_TRACT | Status: DC | PRN

## 2015-12-01 MED ORDER — DOXYCYCLINE HYCLATE 100 MG PO CAPS: 100.0000 mg | ORAL_CAPSULE | Freq: Two times a day (BID) | ORAL | Status: DC

## 2015-12-01 NOTE — Progress Notes (Signed)
Patient left AMA  From the ED, called in Albuterol inhaler and Doxycycline for pt. She will need PFts, pt told she needs to  follow up in clinic on Monday. She agreed.  Linda Miller.

## 2015-12-01 NOTE — ED Notes (Signed)
Pt requesting to leave AMA, reports she does not want to be admitted.  Pt informed of risks, admitting MD paged and notified.

## 2015-12-01 NOTE — ED Notes (Signed)
Patient transported to X-ray 

## 2015-12-01 NOTE — ED Notes (Signed)
[facility] comes [facility], c/o increased SOB since Tuesday, worse today, pt has no hx of asthma, copd or CHF. Pt coughing, wheezing and diminished in lower lobes. Speaking in full sentences. PTA 5MG  albuterol

## 2015-12-01 NOTE — ED Provider Notes (Signed)
CSN: [id number]     Arrival date & time 12/01/15  0442 History   First MD Initiated Contact with Patient 12/01/15 0444     Chief Complaint  Patient presents with  . Shortness of Breath     (Consider location/radiation/quality/duration/timing/severity/associated sxs/prior Treatment) HPI Patient presents with concern of dyspnea. Symptoms began about 4 days ago, since onset has been progressive, with no relief from anything. Patient has no history of pulmonary disease, nor congestive heart failure. No clear precipitant. Since onset, no clear alleviating or exacerbating factors. Over the past day the patient has also developed mild headache, diffusely without confusion, disorientation, vomiting. No chest pain, no cough. Patient denies history of renal disease, hepatic disease. She states that she takes her medication as directed. Per EMS, the patient was hypertensive on their arrival, with a systolic greater than 409, tachypnea, tachycardia, and hypoxia on room air. Patient improved with initial albuterol therapy, states that she feels marginally better after being transported here.  Past Medical History  Diagnosis Date  . Diabetes mellitus   . Hypertension   . High cholesterol   . Osteomyelitis of right foot [facility])    Past Surgical History  Procedure Laterality Date  . Abdominal hysterectomy    . Debridement  foot    . I&d extremity  09/23/2012    Procedure: IRRIGATION AND DEBRIDEMENT EXTREMITY;  Surgeon: Newt Minion, MD;  Location: [place];  Service: Orthopedics;  Laterality: Right;  . Tee without cardioversion  09/24/2012    Procedure: TRANSESOPHAGEAL ECHOCARDIOGRAM (TEE);  Surgeon: Larey Dresser, MD;  Location: [facility] ENDOSCOPY;  Service: Cardiovascular;  Laterality: N/A;   Family History  Problem Relation Age of Onset  . Diabetes Mother   . Cancer Father    Social History  Substance Use Topics  . Smoking status: Former Smoker -- 0.20 packs/day    Types: Cigarettes    Quit  date: 11/07/2013  . Smokeless tobacco: Former Systems developer    Quit date: 06/25/2014     Comment: plans to quit by Christmas, down to 1 cigarette/week  . Alcohol Use: Yes   OB History    No data available     Review of Systems  Constitutional:       Per HPI, otherwise negative  HENT:       Per HPI, otherwise negative  Respiratory:       Per HPI, otherwise negative  Cardiovascular:       Per HPI, otherwise negative  Gastrointestinal: Negative for vomiting.  Endocrine:       Negative aside from HPI  Genitourinary:       Neg aside from HPI   Musculoskeletal:       Per HPI, otherwise negative  Skin: Negative.   Neurological: Negative for syncope.      Allergies  Codeine; Lyrica; and Tramadol  Home Medications   Prior to Admission medications   Medication Sig Start Date End Date Taking? Authorizing Provider  ACCU-CHEK FASTCLIX LANCETS MISC Use to check blood sugar 3 to 4 times daily.Insulin requiring.  dx code E11.22 06/06/15   Juluis Mire, MD  acetaminophen-codeine (TYLENOL #3) 300-30 MG tablet TAKE 1 TABLET BY MOUTH EVERY 8 HOURS AS NEEDED FOR MODERATE PAIN 11/03/15   Juluis Mire, MD  amitriptyline (ELAVIL) 50 MG tablet Take 2 tablets (100 mg total) by mouth at bedtime. 04/28/15   Juluis Mire, MD  atorvastatin (LIPITOR) 40 MG tablet Take 1 tablet (40 mg total) by mouth daily. 02/11/15 02/11/16  Marjan Rabbani,  MD  Blood Glucose Monitoring Suppl (ACCU-CHEK NANO SMARTVIEW) W/DEVICE KIT 1 each by Does not apply route 2 (two) times daily. 05/05/15   Juluis Mire, MD  fluconazole (DIFLUCAN) 150 MG tablet Take 150 mg by mouth once.    Historical Provider, MD  glucose blood (ACCU-CHEK SMARTVIEW) test strip 3 to 4  times daily before meals, diag code E11.22, insulin requiring 08/11/15   Juluis Mire, MD  Insulin Syringe-Needle U-100 31G X 15/64" 0.3 ML MISC 1 each by Does not apply route 2 (two) times daily. Use to inject insulin twice daily dx code E11.22. Insulin dependent 06/02/15    Juluis Mire, MD  lisinopril-hydrochlorothiazide (PRINZIDE,ZESTORETIC) 20-12.5 MG tablet TAKE 1 TABLET BY MOUTH DAILY. 11/13/15   Juluis Mire, MD  metFORMIN (GLUCOPHAGE) 1000 MG tablet Take 1 tablet (1,000 mg total) by mouth 2 (two) times daily. 02/11/15   Marjan Rabbani, MD  NOVOLOG MIX 70/30 (70-30) 100 UNIT/ML injection INJECT 22 UNITS UNDER THE SKIN DAILY WITH BREAKFAST AND 22 UNITS WITH DINNER 11/03/15   Juluis Mire, MD   There were no vitals taken for this visit. Physical Exam  Constitutional: She is oriented to person, place, and time. She appears well-developed and well-nourished. No distress.  HENT:  Head: Normocephalic and atraumatic.  Eyes: Conjunctivae and EOM are normal.  Cardiovascular: Regular rhythm.  Tachycardia present.   Pulmonary/Chest: Accessory muscle usage present. No stridor. Tachypnea noted. She is in respiratory distress. She has decreased breath sounds. She has wheezes.  Abdominal: She exhibits no distension.  Musculoskeletal: She exhibits no edema.  Neurological: She is alert and oriented to person, place, and time. No cranial nerve deficit.  Skin: Skin is warm and dry.  Psychiatric: She has a normal mood and affect.  Nursing note and vitals reviewed.   ED Course  Procedures (including critical care time) Labs Review Labs Reviewed  COMPREHENSIVE METABOLIC PANEL - Abnormal; Notable for the following:    Glucose, Bld 402 (*)    Albumin 3.2 (*)    ALT 12 (*)    All other components within normal limits  CBC WITH DIFFERENTIAL/PLATELET - Abnormal; Notable for the following:    WBC 19.5 (*)    Hemoglobin 11.5 (*)    MCH 24.8 (*)    Neutro Abs 14.9 (*)    Monocytes Absolute 1.4 (*)    All other components within normal limits  BRAIN NATRIURETIC PEPTIDE - Abnormal; Notable for the following:    B Natriuretic Peptide 308.8 (*)    All other components within normal limits  CBG MONITORING, ED - Abnormal; Notable for the following:    Glucose-Capillary  346 (*)    All other components within normal limits  TROPONIN I    Imaging Review Dg Chest 2 View  12/01/2015  CLINICAL DATA:  Acute onset of shortness of breath and cough. Initial encounter. EXAM: CHEST  2 VIEW COMPARISON:  Chest radiograph performed 09/07/2013 FINDINGS: The lungs are well-aerated. Mild vascular congestion is noted. There is no evidence of focal opacification, pleural effusion or pneumothorax. The heart is borderline normal in size. No acute osseous abnormalities are seen. IMPRESSION: Mild vascular congestion noted.  Lungs remain grossly clear. Electronically Signed   By: Garald Balding M.D.   On: 12/01/2015 05:51   I have personally reviewed and evaluated these images and lab results as part of my medical decision-making.   EKG Interpretation   Date/Time:  Friday December 01 2015 04:49:45 EST Ventricular Rate:  104 PR Interval:  170 QRS  Duration: 96 QT Interval:  380 QTC Calculation: 500 R Axis:   26 Text Interpretation:  Sinus tachycardia Probable left atrial enlargement  Anteroseptal infarct, age indeterminate Sinus tachycardia Artifact  Abnormal ekg Confirmed by Carmin Muskrat  MD [phone]) on 12/01/2015 4:56:05  AM     patient's last echocardiogram, 2013, had preserved ejection fraction.  Labs today notable for elevated BNP. With persistent tachypnea, increased work of breathing, concern for CHF, the patient will be admitted for further evaluation, management, possible echocardiogram.   MDM   patient is ongoing dyspnea for several days. Here the patient is awake, alert, afebrile, but tachycardic, tachypneic, with diffuse wheezing bilaterally. No x-ray evidence for pneumonia, but patient does have vascular congestion consistent with fluid overload status. Patient has no recent echocardiogram. The patient improved here, she required admission for further evaluation and management.    Carmin Muskrat, MD 12/01/15 [phone]

## 2015-12-08 ENCOUNTER — Ambulatory Visit (INDEPENDENT_AMBULATORY_CARE_PROVIDER_SITE_OTHER): Payer: Medicare Other | Admitting: Pulmonary Disease

## 2015-12-08 ENCOUNTER — Encounter: Payer: Self-pay | Admitting: Pulmonary Disease

## 2015-12-08 VITALS — BP 128/73 | HR 92 | Temp 98.2°F | Wt 193.2 lb

## 2015-12-08 DIAGNOSIS — X58XXXS Exposure to other specified factors, sequela: Secondary | ICD-10-CM

## 2015-12-08 DIAGNOSIS — S82401S Unspecified fracture of shaft of right fibula, sequela: Secondary | ICD-10-CM | POA: Diagnosis not present

## 2015-12-08 DIAGNOSIS — Z7984 Long term (current) use of oral hypoglycemic drugs: Secondary | ICD-10-CM | POA: Diagnosis not present

## 2015-12-08 DIAGNOSIS — R0600 Dyspnea, unspecified: Secondary | ICD-10-CM | POA: Diagnosis not present

## 2015-12-08 DIAGNOSIS — E1142 Type 2 diabetes mellitus with diabetic polyneuropathy: Secondary | ICD-10-CM | POA: Diagnosis not present

## 2015-12-08 DIAGNOSIS — E1165 Type 2 diabetes mellitus with hyperglycemia: Secondary | ICD-10-CM | POA: Diagnosis not present

## 2015-12-08 DIAGNOSIS — E1122 Type 2 diabetes mellitus with diabetic chronic kidney disease: Secondary | ICD-10-CM | POA: Diagnosis not present

## 2015-12-08 DIAGNOSIS — Z794 Long term (current) use of insulin: Secondary | ICD-10-CM

## 2015-12-08 DIAGNOSIS — N182 Chronic kidney disease, stage 2 (mild): Secondary | ICD-10-CM

## 2015-12-08 DIAGNOSIS — G6289 Other specified polyneuropathies: Secondary | ICD-10-CM

## 2015-12-08 DIAGNOSIS — L84 Corns and callosities: Secondary | ICD-10-CM

## 2015-12-08 LAB — POCT GLYCOSYLATED HEMOGLOBIN (HGB A1C): Hemoglobin A1C: 10.3

## 2015-12-08 LAB — GLUCOSE, CAPILLARY: Glucose-Capillary: 311 mg/dL — ABNORMAL HIGH (ref 65–99)

## 2015-12-08 MED ORDER — GLUCOSE BLOOD VI STRP: ORAL_STRIP | Status: DC

## 2015-12-08 MED ORDER — AMITRIPTYLINE HCL 50 MG PO TABS: 100.0000 mg | ORAL_TABLET | Freq: Every day | ORAL | Status: DC

## 2015-12-08 MED ORDER — ACETAMINOPHEN-CODEINE #3 300-30 MG PO TABS: ORAL_TABLET | ORAL | Status: DC

## 2015-12-08 MED ORDER — INSULIN ASPART PROT & ASPART (70-30 MIX) 100 UNIT/ML ~~LOC~~ SUSP: 25.0000 [IU] | Freq: Two times a day (BID) | SUBCUTANEOUS | Status: DC

## 2015-12-08 NOTE — Assessment & Plan Note (Addendum)
Assessment: She has a callous at the bottom of her feet. She has high risk feet given her uncontrolled diabetes.  Plan: -Will have her seen by either orthopedics or podiatry (previously seen by Dr. Lajoyce Cornersuda).

## 2015-12-08 NOTE — Progress Notes (Signed)
Medicine attending: Medical history, presenting problems, physical findings, and medications, reviewed with resident physician Dr Jennifer Krall on the day of the patient visit and I concur with her evaluation and management plan. 

## 2015-12-08 NOTE — Assessment & Plan Note (Signed)
Lab Results  Component Value Date   HGBA1C 10.3 12/08/2015   HGBA1C 9.0 08/11/2015   HGBA1C 9.6 04/28/2015     Assessment: Diabetes control: poor control (HgbA1C >9%) Progress toward A1C goal:  deteriorated Comments: Blood glucoses from 230 to 335. She had one reading of 44 on 12/24 which she attributes to giving herself the wrong dose of insulin.   Plan: Medications:  Increase Novolog 70/30 to 25u BID with meals. Continue metformin 1000mg  BID. Other plans:  -Follow up in 2-3 weeks.

## 2015-12-08 NOTE — Progress Notes (Signed)
Subjective:    Patient ID: Linda Miller, female    DOB: 04-Jun-1956, 60 y.o.   MRN: [id number]  HPI Linda Miller is a 60 year old woman with history of DM2, HTN, HLD presenting for follow up of dyspnea.  She was seen in the ED 12/01/2015 for dyspnea. Improved with albuterol neb. BNP was elevated to 308.8. Troponin negative. She had a leukocytosis to 19.5. Mild vascular congestion on CXR.  Last echo 09/24/2012 with LV EF 55-60%.  Cough productive of thick white sputum. Previously yellowish green. She has rib pain associated with her cough. Two days prior to going to ED she was having chest pain. In mid sternum, sharp, intermittent. Worsened with deep breathing. Pain seemed to get better with leaning forward. No pain like this before. Lasted a couple of days. Pain has since resolved. Had some leg edema but that has also resolved.  Review of Systems Constitutional: no fevers/chills Ears, nose, mouth, throat, and face: +cough Respiratory: no shortness of breath Cardiovascular: no chest pain Gastrointestinal: no nausea/vomiting, no abdominal pain, no diarrhea Genitourinary: no dysuria Hematologic/lymphatic: no edema  Past Medical History  Diagnosis Date  . Diabetes mellitus   . Hypertension   . High cholesterol   . Osteomyelitis of right foot [facility])     Current Outpatient Prescriptions on File Prior to Visit  Medication Sig Dispense Refill  . ACCU-CHEK FASTCLIX LANCETS MISC Use to check blood sugar 3 to 4 times daily.Insulin requiring.  dx code E11.22 102 each 6  . acetaminophen-codeine (TYLENOL #3) 300-30 MG tablet TAKE 1 TABLET BY MOUTH EVERY 8 HOURS AS NEEDED FOR MODERATE PAIN 30 tablet 2  . albuterol (VENTOLIN HFA) 108 (90 Base) MCG/ACT inhaler Inhale 1-2 puffs into the lungs every 6 (six) hours as needed for wheezing or shortness of breath. 1 Inhaler 0  . amitriptyline (ELAVIL) 50 MG tablet Take 2 tablets (100 mg total) by mouth at bedtime. 30 tablet 5  . atorvastatin  (LIPITOR) 40 MG tablet Take 1 tablet (40 mg total) by mouth daily. 30 tablet 5  . Blood Glucose Monitoring Suppl (ACCU-CHEK NANO SMARTVIEW) W/DEVICE KIT 1 each by Does not apply route 2 (two) times daily. 1 kit 0  . doxycycline (VIBRAMYCIN) 100 MG capsule Take 1 capsule (100 mg total) by mouth 2 (two) times daily. 14 capsule 0  . glucose blood (ACCU-CHEK SMARTVIEW) test strip 3 to 4  times daily before meals, diag code E11.22, insulin requiring 100 each 6  . Insulin Syringe-Needle U-100 31G X 15/64" 0.3 ML MISC 1 each by Does not apply route 2 (two) times daily. Use to inject insulin twice daily dx code E11.22. Insulin dependent 100 each 6  . lisinopril-hydrochlorothiazide (PRINZIDE,ZESTORETIC) 20-12.5 MG tablet TAKE 1 TABLET BY MOUTH DAILY. 30 tablet 5  . metFORMIN (GLUCOPHAGE) 1000 MG tablet Take 1 tablet (1,000 mg total) by mouth 2 (two) times daily. 180 tablet 5  . NOVOLOG MIX 70/30 (70-30) 100 UNIT/ML injection INJECT 22 UNITS UNDER THE SKIN DAILY WITH BREAKFAST AND 22 UNITS WITH DINNER (Patient taking differently: INJECT 22 UNITS UNDER THE SKIN DAILY WITH BREAKFAST AND 20 UNITS WITH DINNER) 10 mL 11   No current facility-administered medications on file prior to visit.    Today's Vitals   12/08/15 1405  BP: 128/73  Pulse: 92  Temp: 98.2 F (36.8 C)  TempSrc: Oral  Weight: 193 lb 3.2 oz (87.635 kg)  SpO2: 100%   Objective:   Physical Exam  Constitutional: She appears well-developed. No  distress.  HENT:  Head: Normocephalic and atraumatic.  Cardiovascular: Normal rate and regular rhythm.   Pulmonary/Chest: Effort normal and breath sounds normal. She has no wheezes. She has no rales.  Abdominal: Soft.  Musculoskeletal: Normal range of motion. She exhibits no edema or tenderness.  Skin: Skin is warm and dry. No erythema.  1cm firm, mobile subcutaneous lesion on right upper abdomen without surrounding erythema. No drainage. Healing overlying wound.  1cm hardened epidermis on  plantar surface of left foot.   Assessment & Plan:  Please refer to problem based charting.

## 2015-12-08 NOTE — Assessment & Plan Note (Signed)
Assessment: Dyspnea likely COPD exacerbation. CHF exacerbation on differential as well as BNP was elevated but not over 400 with some vascular congestion seen on CXR. Dypsnea improving.  Plan: -PFTs in 3-4 weeks -Echo

## 2015-12-08 NOTE — Assessment & Plan Note (Signed)
Assessment: She is out of her amitriptyline.  Plan: -Refill sent.

## 2015-12-08 NOTE — Assessment & Plan Note (Signed)
Assessment: Continues to have pain in there RLE.  Plan: -Refilled Tylenol #3 q8hr prn pain #30. She is to follow up with her PCP.

## 2015-12-11 ENCOUNTER — Other Ambulatory Visit: Payer: Self-pay | Admitting: Internal Medicine

## 2015-12-12 NOTE — Addendum Note (Signed)
Addended by: Griffin Basil T on: 12/12/2015 01:03 PM   Modules accepted: Orders

## 2015-12-18 ENCOUNTER — Other Ambulatory Visit (HOSPITAL_COMMUNITY): Payer: Medicare Other

## 2015-12-28 ENCOUNTER — Telehealth: Payer: Self-pay | Admitting: Internal Medicine

## 2015-12-28 NOTE — Telephone Encounter (Signed)
Call to patient to confirm appointment for 12/29/15 at 2:15 no answer

## 2015-12-29 ENCOUNTER — Ambulatory Visit: Payer: Medicare Other | Admitting: Internal Medicine

## 2016-01-01 ENCOUNTER — Other Ambulatory Visit: Payer: Self-pay

## 2016-01-01 ENCOUNTER — Ambulatory Visit (HOSPITAL_COMMUNITY): Payer: Medicare Other | Attending: Cardiovascular Disease

## 2016-01-01 DIAGNOSIS — E785 Hyperlipidemia, unspecified: Secondary | ICD-10-CM | POA: Insufficient documentation

## 2016-01-01 DIAGNOSIS — I340 Nonrheumatic mitral (valve) insufficiency: Secondary | ICD-10-CM | POA: Insufficient documentation

## 2016-01-01 DIAGNOSIS — I517 Cardiomegaly: Secondary | ICD-10-CM | POA: Diagnosis not present

## 2016-01-01 DIAGNOSIS — I358 Other nonrheumatic aortic valve disorders: Secondary | ICD-10-CM | POA: Diagnosis not present

## 2016-01-01 DIAGNOSIS — F17200 Nicotine dependence, unspecified, uncomplicated: Secondary | ICD-10-CM | POA: Diagnosis not present

## 2016-01-01 DIAGNOSIS — R0600 Dyspnea, unspecified: Secondary | ICD-10-CM | POA: Diagnosis not present

## 2016-01-01 DIAGNOSIS — I10 Essential (primary) hypertension: Secondary | ICD-10-CM | POA: Diagnosis not present

## 2016-01-01 DIAGNOSIS — E119 Type 2 diabetes mellitus without complications: Secondary | ICD-10-CM | POA: Insufficient documentation

## 2016-01-02 NOTE — Assessment & Plan Note (Addendum)
Echo with mildly decreased LV EF. Inferior and septal hypokinesis new since 2013.  Will refer her to cardiology for further evaluation. Discussed results with patient on phone.

## 2016-01-03 ENCOUNTER — Encounter: Payer: Self-pay | Admitting: Internal Medicine

## 2016-01-03 ENCOUNTER — Ambulatory Visit (INDEPENDENT_AMBULATORY_CARE_PROVIDER_SITE_OTHER): Payer: Medicare Other | Admitting: Internal Medicine

## 2016-01-03 VITALS — BP 121/69 | HR 88 | Temp 98.3°F | Wt 200.6 lb

## 2016-01-03 DIAGNOSIS — I130 Hypertensive heart and chronic kidney disease with heart failure and stage 1 through stage 4 chronic kidney disease, or unspecified chronic kidney disease: Secondary | ICD-10-CM | POA: Diagnosis not present

## 2016-01-03 DIAGNOSIS — I5042 Chronic combined systolic (congestive) and diastolic (congestive) heart failure: Secondary | ICD-10-CM

## 2016-01-03 DIAGNOSIS — I10 Essential (primary) hypertension: Secondary | ICD-10-CM | POA: Diagnosis not present

## 2016-01-03 DIAGNOSIS — Z794 Long term (current) use of insulin: Secondary | ICD-10-CM | POA: Diagnosis not present

## 2016-01-03 DIAGNOSIS — E1122 Type 2 diabetes mellitus with diabetic chronic kidney disease: Secondary | ICD-10-CM

## 2016-01-03 DIAGNOSIS — E1165 Type 2 diabetes mellitus with hyperglycemia: Secondary | ICD-10-CM

## 2016-01-03 DIAGNOSIS — N182 Chronic kidney disease, stage 2 (mild): Secondary | ICD-10-CM | POA: Diagnosis not present

## 2016-01-03 DIAGNOSIS — Z7984 Long term (current) use of oral hypoglycemic drugs: Secondary | ICD-10-CM | POA: Diagnosis not present

## 2016-01-03 DIAGNOSIS — Z79899 Other long term (current) drug therapy: Secondary | ICD-10-CM

## 2016-01-03 MED ORDER — METOPROLOL TARTRATE 25 MG PO TABS: 25.0000 mg | ORAL_TABLET | Freq: Two times a day (BID) | ORAL | Status: DC

## 2016-01-03 MED ORDER — METOPROLOL TARTRATE 25 MG PO TABS
25.0000 mg | ORAL_TABLET | Freq: Two times a day (BID) | ORAL | Status: DC
Start: 2016-01-03 — End: 2016-05-08

## 2016-01-03 MED ORDER — METOPROLOL TARTRATE 25 MG PO TABS: 12.5000 mg | ORAL_TABLET | Freq: Every day | ORAL | Status: DC

## 2016-01-03 MED ORDER — FUROSEMIDE 20 MG PO TABS: 20.0000 mg | ORAL_TABLET | Freq: Two times a day (BID) | ORAL | Status: DC

## 2016-01-03 MED ORDER — LISINOPRIL 20 MG PO TABS: 20.0000 mg | ORAL_TABLET | Freq: Every day | ORAL | Status: DC

## 2016-01-03 NOTE — Progress Notes (Signed)
Subjective:   Patient ID: Linda Miller female   DOB: [birth year] 60 y.o.   MRN: [id number]  HPI: [name] is a 60 y.o. female w/ PMHx of DM type II, HTN, HLD, h/o right foot osteomyelitis, presents to the clinic today for a follow-up visit regarding her dyspnea. Patient was seen 2 weeks ago for SOB, suspected of having CHF vs COPD. Sent for ECHO and PFT's the latter of which she has not had yet. ECHO showed EF of 45% with inferior and septal wall hypokinesis, as well as diastolic dysfunction. CXR at her previous visit was significant for some central venous congestion. Patient continues to describe DOE and intermittent SOB, as well as some mild symptoms of PND and orthopnea. She also describes very atypical type chest pain, located on the left side, mostly with inspiration and movement, not necessarily related to exertion. Patient does smoke 10 cigarettes daily and has a family history of MI in her mother (has had CABG).   Today, her complaints are of continued intermittent dyspnea as described above, as well as chronic pain in her back and left leg as she has had in the past. She takes Elavil and Tylenol #3 for this.   She also continues to have large fluctuations in her blood sugars, from 65-450, most likely related to eating very little while taking her insulin, as well as not eating the right food most of the time. She has had some mild symptoms of hypoglycemia in which she will eat something sweet to bring her blood sugar up.   Past Medical History  Diagnosis Date  . Diabetes mellitus   . Hypertension   . High cholesterol   . Osteomyelitis of right foot [facility])    Current Outpatient Prescriptions  Medication Sig Dispense Refill  . ACCU-CHEK FASTCLIX LANCETS MISC Use to check blood sugar 3 to 4 times daily.Insulin requiring.  dx code E11.22 102 each 6  . acetaminophen-codeine (TYLENOL #3) 300-30 MG tablet TAKE 1 TABLET BY MOUTH EVERY 8 HOURS AS NEEDED FOR MODERATE PAIN 30  tablet 0  . amitriptyline (ELAVIL) 50 MG tablet Take 2 tablets (100 mg total) by mouth at bedtime. 30 tablet 5  . atorvastatin (LIPITOR) 40 MG tablet Take 1 tablet (40 mg total) by mouth daily. 30 tablet 5  . Blood Glucose Monitoring Suppl (ACCU-CHEK NANO SMARTVIEW) W/DEVICE KIT 1 each by Does not apply route 2 (two) times daily. 1 kit 0  . fluconazole (DIFLUCAN) 150 MG tablet Take 1 tablet (150 mg total) by mouth as needed (take 1 as needed for vaginal yeast infection). 1 tablet 2  . furosemide (LASIX) 20 MG tablet Take 1 tablet (20 mg total) by mouth daily. 30 tablet 5  . glucose blood (ACCU-CHEK SMARTVIEW) test strip 3 to 4  times daily before meals, diag code E11.22, insulin requiring 100 each 6  . insulin aspart protamine- aspart (NOVOLOG MIX 70/30) (70-30) 100 UNIT/ML injection Inject 0.25 mLs (25 Units total) into the skin 2 (two) times daily with a meal. 10 mL 11  . Insulin Syringe-Needle U-100 31G X 15/64" 0.3 ML MISC 1 each by Does not apply route 2 (two) times daily. Use to inject insulin twice daily dx code E11.22. Insulin dependent 100 each 6  . lisinopril (PRINIVIL,ZESTRIL) 20 MG tablet Take 1 tablet (20 mg total) by mouth daily. 30 tablet 5  . metFORMIN (GLUCOPHAGE) 1000 MG tablet Take 1 tablet (1,000 mg total) by mouth 2 (two) times daily. 180 tablet 5  .  metoprolol tartrate (LOPRESSOR) 25 MG tablet Take 1 tablet (25 mg total) by mouth 2 (two) times daily. 60 tablet 5  . VENTOLIN HFA 108 (90 Base) MCG/ACT inhaler INHALE 1-2 PUFFS INTO THE LUNGS EVERY 6 (SIX) HOURS AS NEEDED FOR WHEEZING OR SHORTNESS OF BREATH. 18 g 2   No current facility-administered medications for this visit.    Review of Systems: General: Positive for fatigue. Denies fever, chills, diaphoresis, appetite change.  Respiratory: Positive for DOE. Denies cough, and wheezing.   Cardiovascular: Positive for intermittent chest pain. Denies palpitations.  Gastrointestinal: Denies nausea, vomiting, abdominal pain, and  diarrhea.  Genitourinary: Denies dysuria, increased frequency, and flank pain. Endocrine: Denies hot or cold intolerance, polyuria, and polydipsia. Musculoskeletal: Positive for chronic back pain. Denies myalgias, joint swelling, arthralgias and gait problem.  Skin: Denies pallor, rash and wounds.  Neurological: Denies dizziness, seizures, syncope, weakness, lightheadedness, numbness and headaches.  Psychiatric/Behavioral: Denies mood changes, and sleep disturbances.  Objective:   Physical Exam: Filed Vitals:   01/03/16 1341  BP: 121/69  Pulse: 88  Temp: 98.3 F (36.8 C)  TempSrc: Oral  Weight: 200 lb 9.6 oz (90.992 kg)  SpO2: 100%    General: AA female, alert, cooperative, NAD. HEENT: PERRL, EOMI. Moist mucus membranes Neck: Full range of motion without pain, supple, no lymphadenopathy. No obvious JVD. Lungs: Air entry equal bilaterally. Mild rales in the bases bilaterally. No wheezes or rhonchi.  Heart: RRR, no murmurs, gallops, or rubs Abdomen: Soft, non-tender, non-distended, BS + Extremities: No cyanosis, clubbing. Trace pitting edema.  Neurologic: Alert & oriented x3, cranial nerves II-XII intact, strength grossly intact, sensation intact to light touch   Assessment & Plan:   Please see problem based assessment and plan.

## 2016-01-03 NOTE — Patient Instructions (Signed)
1.Please make a follow up appointment for 1 month with PCP.   Please also see cardiologist for further management of heart.   2. Please take all medications as previously prescribed with the following changes:  STOP combination Lisinopril HCTZ pill.   Start taking Lisinopril 20 mg daily  Also take Lasix 20 mg daily  I have also started Metoprolol 25 mg twice daily. All of these medications are good for your heart and blood pressure.  3. If you have worsening of your symptoms or new symptoms arise, please call the clinic ([phone]), or go to the ER immediately if symptoms are severe.  You have done a great job in taking all your medications. Please continue to do this.

## 2016-01-04 LAB — BMP8+ANION GAP
Anion Gap: 14 mmol/L (ref 10.0–18.0)
BUN / CREAT RATIO: 8 — AB (ref 9–23)
BUN: 7 mg/dL (ref 6–24)
CALCIUM: 8.6 mg/dL — AB (ref 8.7–10.2)
CHLORIDE: 103 mmol/L (ref 96–106)
CO2: 25 mmol/L (ref 18–29)
CREATININE: 0.84 mg/dL (ref 0.57–1.00)
GFR calc non Af Amer: 77 mL/min/{1.73_m2} (ref >59)
GFR, EST AFRICAN AMERICAN: 89 mL/min/{1.73_m2} (ref >59)
GLUCOSE: 150 mg/dL — AB (ref 65–99)
Potassium: 4.6 mmol/L (ref 3.5–5.2)
Sodium: 142 mmol/L (ref 134–144)

## 2016-01-04 MED ORDER — FUROSEMIDE 20 MG PO TABS: 20.0000 mg | ORAL_TABLET | Freq: Every day | ORAL | Status: DC

## 2016-01-04 NOTE — Assessment & Plan Note (Signed)
Lab Results  Component Value Date   HGBA1C 10.3 12/08/2015   HGBA1C 9.0 08/11/2015   HGBA1C 9.6 04/28/2015     Assessment: Diabetes control:  Poorly controlled Comments: Patient with very labile blood sugars, very obviously related to her diet. Eats high sugar foods frequently and on occasions, does not eat enough with her insulin. Has swings with her CBG's from 65-450. Has symptoms with hypoglycemia, eats something sweet to bring up blood sugar. Discussed diet at length today.   Plan: Medications:  continue current medications; Metformin 1000 mg bid + 70/30 insulin 25 units bid. Swings in blood sugar do not correspond specifically to a certain time of day, do not feel it is safe to increase her insulin at this time. Explained importance of eating prior to inulin use.  Home glucose monitoring: Frequency:  tid Instruction/counseling given: reminded to bring blood glucose meter & log to each visit, reminded to bring medications to each visit, discussed foot care, discussed the need for weight loss and discussed diet Educational resources provided:   Self management tools provided:   Other plans: RTC in 4 weeks for further management of her diabetes.

## 2016-01-04 NOTE — Addendum Note (Signed)
Addended by: Neomia Dear on: 01/04/2016 07:53 PM   Modules accepted: Orders

## 2016-01-04 NOTE — Assessment & Plan Note (Addendum)
BP Readings from Last 3 Encounters:  01/03/16 121/69  12/08/15 128/73  12/01/15 136/67    Lab Results  Component Value Date   NA 142 01/03/2016   K 4.6 01/03/2016   CREATININE 0.84 01/03/2016    Assessment: Blood pressure control:  Well controlled Comments: BP well controlled, although given ECHO findings and symptoms of dyspnea, made following changes with regards to BP medications:  Plan: Medications:  STOP HCTZ. Continue Lisinopril 20 mg daily. Start Metoprolol 25 mg bid and Lasix 20 mg daily.  Other plans: BMP with normal renal function. RTC in 1 month to follow up with PCP. Also given referral to see cardiology.

## 2016-01-04 NOTE — Assessment & Plan Note (Addendum)
Patient with recent ECHO suggesting mild chronic combined CHF. EF 45% with inferior and septal hypokinesis, as well as diastolic dysfunction. This would explain her complaints of ongoing dyspnea. Patient does have fine crackles in the bases, trace pitting edema, and elevated BNP (400) at last clinic visit. No obvious JVD seen. CXR significant for central venous congestion. BMP checked today, patient with good renal function.  -Stop HCTZ, start Lasix 20 mg daily  -Continue Lisinopril 20 mg daily -Start Metoprolol 25 mg bid for now -Referral to cardiology for ischemic evaluation given high pretest probability for ischemic heart disease (family history, intermittent chest pain, HTN, HLD, uncontrolled DM type II). -RTC in 1 month

## 2016-01-05 LAB — GLUCOSE, CAPILLARY: Glucose-Capillary: 149 mg/dL — ABNORMAL HIGH (ref 65–99)

## 2016-01-09 NOTE — Progress Notes (Signed)
Internal Medicine Clinic Attending  Case discussed with Dr. Jones at the time of the visit.  We reviewed the resident's history and exam and pertinent patient test results.  I agree with the assessment, diagnosis, and plan of care documented in the resident's note.  

## 2016-01-31 ENCOUNTER — Ambulatory Visit (INDEPENDENT_AMBULATORY_CARE_PROVIDER_SITE_OTHER): Payer: Medicare Other | Admitting: Internal Medicine

## 2016-01-31 VITALS — BP 117/74 | HR 89 | Temp 98.3°F | Ht 63.5 in | Wt 194.2 lb

## 2016-01-31 DIAGNOSIS — E1122 Type 2 diabetes mellitus with diabetic chronic kidney disease: Secondary | ICD-10-CM

## 2016-01-31 DIAGNOSIS — E1165 Type 2 diabetes mellitus with hyperglycemia: Secondary | ICD-10-CM

## 2016-01-31 DIAGNOSIS — Z794 Long term (current) use of insulin: Secondary | ICD-10-CM

## 2016-01-31 DIAGNOSIS — X58XXXS Exposure to other specified factors, sequela: Secondary | ICD-10-CM

## 2016-01-31 DIAGNOSIS — S82401S Unspecified fracture of shaft of right fibula, sequela: Secondary | ICD-10-CM | POA: Diagnosis not present

## 2016-01-31 DIAGNOSIS — I5042 Chronic combined systolic (congestive) and diastolic (congestive) heart failure: Secondary | ICD-10-CM | POA: Diagnosis not present

## 2016-01-31 DIAGNOSIS — N182 Chronic kidney disease, stage 2 (mild): Secondary | ICD-10-CM

## 2016-01-31 MED ORDER — ACETAMINOPHEN-CODEINE #3 300-30 MG PO TABS: ORAL_TABLET | ORAL | Status: DC

## 2016-01-31 MED ORDER — INSULIN ASPART PROT & ASPART (70-30 MIX) 100 UNIT/ML ~~LOC~~ SUSP: 30.0000 [IU] | Freq: Two times a day (BID) | SUBCUTANEOUS | Status: DC

## 2016-01-31 NOTE — Progress Notes (Signed)
   Subjective:    Patient ID: Linda Miller, female    DOB: May 12, 1956, 60 y.o.   MRN: [id number]  HPI  60 yo female with chronic CHF, HTN, uncontrolled DM II , HLD here for follow up of DM II and dyspnea  Dyspnea: seen for dyspnea in the past, was thought to be from CHF vs COPD. ECHO showed EF 45% with inferior and septal hypokinesis as well as diastolic dysfunction. Was volume overloaded on last exam on 01/03/16 with Dr. Yetta BarreJones. Was put on lasix 20mg  daily (stopped hctz) along with continuation of lisinopril 20mg  daily. Was started on metoprolol 25mg  bid. Had referral to cardiology for ischemic evaluation. Did not see cards as she thought Dr. Yetta BarreJones was the cardiologist. States her breathing is better, still has nocturnal cough. Drinking a gallon of water daily. Weight is coming down nicely to 194 lb at home. Avoiding salt. Denies any chest pain.  DM II - last hgba1c 10.3, had labile blood sugar. Was continued on metformin 1000mg  bid + 70/30 insulin 25unids BID. Meter reading shows glucose all in high 200 to 400 range. Doing 9 AM and 9 PM 70/30 novolog 25mg  BID.   Wants refill for tylenol #3. Takes it for right foot pain, had distal fibula and medial maelleolus fracture here. Unable to walk or function without this.  Review of Systems  Constitutional: Negative for fever, chills and fatigue.  HENT: Negative for congestion, rhinorrhea and sore throat.   Eyes: Negative for photophobia and visual disturbance.  Respiratory: Positive for cough and stridor. Negative for choking, chest tightness and wheezing.   Cardiovascular: Negative for chest pain, palpitations and leg swelling.  Gastrointestinal: Negative for abdominal pain and abdominal distention.  Endocrine: Negative.   Genitourinary: Negative for dysuria and flank pain.  Musculoskeletal: Positive for arthralgias.  Skin: Negative.   Allergic/Immunologic: Negative.   Neurological: Negative for dizziness, facial asymmetry and headaches.    Hematological: Negative.   Psychiatric/Behavioral: Negative for behavioral problems and agitation.       Objective:   Physical Exam  Constitutional: She is oriented to person, place, and time. She appears well-developed and well-nourished. No distress.  HENT:  Head: Normocephalic and atraumatic.  Mouth/Throat: No oropharyngeal exudate.  Eyes: Conjunctivae are normal. Pupils are equal, round, and reactive to light.  Neck: Normal range of motion.  No jvd  Cardiovascular: Normal rate and regular rhythm.  Exam reveals no gallop and no friction rub.   No murmur heard. Pulmonary/Chest: Effort normal and breath sounds normal. No respiratory distress. She has no wheezes. She has no rales. She exhibits no tenderness.  Abdominal: Soft. Bowel sounds are normal. She exhibits no distension. There is no tenderness.  Musculoskeletal: Normal range of motion. She exhibits no tenderness.  No edema  Neurological: She is alert and oriented to person, place, and time.  Skin: Skin is warm. She is not diaphoretic.  Psychiatric: She has a normal mood and affect.     Filed Vitals:   01/31/16 1448  BP: 117/74  Pulse: 89  Temp: 98.3 F (36.8 C)        Assessment & Plan:  See problem based a&p.

## 2016-01-31 NOTE — Patient Instructions (Signed)
Take novolog 70/30 insulin 30 units twice a day at 9 am and around 6-7 PM.  Continue lasix 20mg  daily.  Continue daily weights. Limit water intake (from all sources) to 1500 mL per day.   F/up with cardiology and f/up with us in 1 month.

## 2016-01-31 NOTE — Assessment & Plan Note (Signed)
Continues to have pain and unable to function without tylenol #3.   -gave her #30 tablets of tylenol #3. Asked her to see her PCP before further refill. Asked to continue taking amitryptiline for her neuropathic pain component.

## 2016-01-31 NOTE — Assessment & Plan Note (Signed)
Doing well on lasix 20mg  daily. euvolemic on exam. Asked her to restrict water intake to 1500cc daily. Will continue lasix 20mg  daily. Will check BMET today. Still has not seen cards, will put in referral to see them for ischemic eval.  Cont daily weights and sodium restriction.

## 2016-01-31 NOTE — Assessment & Plan Note (Signed)
Meter reading in high 200-400's. Currently on 25 units 70/30 novolog BID. She takes it at 9 am and 9 pm which may not be ideal.  Asked her to take novolog 30 units BID. Asked to take at 9 am and then earlier around 6-7 pm at the evening.   F/up in 1 month.

## 2016-02-01 LAB — BASIC METABOLIC PANEL
BUN/Creatinine Ratio: 16 (ref 9–23)
BUN: 16 mg/dL (ref 6–24)
CO2: 19 mmol/L (ref 18–29)
Calcium: 9.6 mg/dL (ref 8.7–10.2)
Chloride: 91 mmol/L — ABNORMAL LOW (ref 96–106)
Creatinine, Ser: 1 mg/dL (ref 0.57–1.00)
GFR calc Af Amer: 72 mL/min/{1.73_m2} (ref >59)
GFR calc non Af Amer: 62 mL/min/{1.73_m2} (ref >59)
GLUCOSE: 491 mg/dL — AB (ref 65–99)
Potassium: 4.6 mmol/L (ref 3.5–5.2)
SODIUM: 131 mmol/L — AB (ref 134–144)

## 2016-02-02 NOTE — Progress Notes (Signed)
Internal Medicine Clinic Attending  Case discussed with Dr. Ahmed at the time of the visit.  We reviewed the resident's history and exam and pertinent patient test results.  I agree with the assessment, diagnosis, and plan of care documented in the resident's note. 

## 2016-02-05 NOTE — Progress Notes (Signed)
This encounter was created in error - please disregard.

## 2016-02-06 ENCOUNTER — Encounter: Payer: Medicare Other | Admitting: Cardiovascular Disease

## 2016-02-16 NOTE — Telephone Encounter (Signed)
Error

## 2016-02-23 ENCOUNTER — Encounter: Payer: Self-pay | Admitting: Cardiovascular Disease

## 2016-02-23 ENCOUNTER — Ambulatory Visit (INDEPENDENT_AMBULATORY_CARE_PROVIDER_SITE_OTHER): Payer: Medicare Other | Admitting: Cardiovascular Disease

## 2016-02-23 ENCOUNTER — Encounter: Payer: Self-pay | Admitting: Interventional Cardiology

## 2016-02-23 VITALS — BP 124/76 | Ht 63.0 in | Wt 194.0 lb

## 2016-02-23 DIAGNOSIS — I5042 Chronic combined systolic (congestive) and diastolic (congestive) heart failure: Secondary | ICD-10-CM | POA: Diagnosis not present

## 2016-02-23 DIAGNOSIS — R0602 Shortness of breath: Secondary | ICD-10-CM

## 2016-02-23 DIAGNOSIS — I10 Essential (primary) hypertension: Secondary | ICD-10-CM

## 2016-02-23 DIAGNOSIS — E785 Hyperlipidemia, unspecified: Secondary | ICD-10-CM

## 2016-02-23 DIAGNOSIS — R079 Chest pain, unspecified: Secondary | ICD-10-CM

## 2016-02-23 MED ORDER — TIOTROPIUM BROMIDE MONOHYDRATE 18 MCG IN CAPS: 18.0000 ug | ORAL_CAPSULE | Freq: Every day | RESPIRATORY_TRACT | Status: DC

## 2016-02-23 MED ORDER — LOSARTAN POTASSIUM 50 MG PO TABS: 50.0000 mg | ORAL_TABLET | Freq: Every day | ORAL | Status: DC

## 2016-02-23 MED ORDER — ALBUTEROL SULFATE HFA 108 (90 BASE) MCG/ACT IN AERS: 2.0000 | INHALATION_SPRAY | Freq: Four times a day (QID) | RESPIRATORY_TRACT | Status: DC | PRN

## 2016-02-23 NOTE — Progress Notes (Signed)
Cardiology Office Note   Date:  02/23/2016   ID:  Linda Miller, Linda Miller February 04, 1956, MRN [id number]  PCP:  Juluis Mire, MD  Cardiologist:   Sharol Harness, MD   Chief Complaint  Patient presents with  . New Evaluation    Referred by Dr. Ronnald Ramp  pt c/o sharp chest tightness/pressure under left breast and in neck; SOB on minimal exertion; Dizziness--occasionally feels like she is going to faint; swelling in legs/feet/ankles/hands--noticed yesterday; Fatigue--feels like she never gets enough sleep; and persistent cough--will not go away, has had for more than 6 weeks      History of Present Illness: Linda Miller is a 60 y.o. 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  [name] saw her PCP  In February with a complaint of dyspnea on exertion.  She was referred for an echo that showed LVEF 45% with inferior and septal wall hypokinesis.  She was started on lasix and metoprolol.  She was referred to cardiology for evaluation.   She reports that she hasn't been feeling well.  She notes fatigue and sleeping all the time.  She has noted tingling and pain in her L arm and leg.   She notes shortness of breath with minimal exertion.  She reports L sided chest pain and a pulling sensation.  The pain lasts for up to hours at a time.  It sometimes improves with massage or a warm towel.  The pain is 8-9/10 in severity and there is no associated nausea or diaphoresis.  [name] also reports gait instability and has been falling 2-3 times daily.  She feels like her legs are giving out underneath her.  She sometimes notes chest pain prior to falling.  Of note, her sister had a heart attack at age 71.  She has been coughing for over six weeks.  She tried an inhaler that does not help.  The cough is productive of white phlegm.  She has been taking lisinopril for over a  year.   She tries to exercise by walking up the stairs at her house.  She has not been exercising much lately because of increased shortness of breath.  She also endorses dizziness that is separate from her falls.  It occurs in all position.  It can occur when laying down, sitting or standing.  She has noted LE edema that has improved with lasix.  She denies orthopnea or PND but has to cough when she lays down.  She continues to smoke 2-3 cigarettes per week.  She has been smoking since age 30.  Past Medical History  Diagnosis Date  . Diabetes mellitus   . Hypertension   . High cholesterol   . Osteomyelitis of right foot [facility])     Past Surgical History  Procedure Laterality Date  . Abdominal hysterectomy    . Debridement  foot    . I&d extremity  09/23/2012    Procedure: IRRIGATION AND DEBRIDEMENT EXTREMITY;  Surgeon: Newt Minion, MD;  Location: [place];  Service: Orthopedics;  Laterality: Right;  . Tee without cardioversion  09/24/2012    Procedure: TRANSESOPHAGEAL ECHOCARDIOGRAM (TEE);  Surgeon: Larey Dresser, MD;  Location: [facility] ENDOSCOPY;  Service: Cardiovascular;  Laterality: N/A;     Current Outpatient Prescriptions  Medication Sig Dispense Refill  . ACCU-CHEK FASTCLIX LANCETS MISC Use to check blood sugar 3 to 4 times daily.Insulin requiring.  dx  code E11.22 102 each 6  . acetaminophen-codeine (TYLENOL #3) 300-30 MG tablet TAKE 1 TABLET BY MOUTH EVERY 8 HOURS AS NEEDED FOR MODERATE PAIN 30 tablet 0  . amitriptyline (ELAVIL) 50 MG tablet Take 2 tablets (100 mg total) by mouth at bedtime. 30 tablet 5  . Blood Glucose Monitoring Suppl (ACCU-CHEK NANO SMARTVIEW) W/DEVICE KIT 1 each by Does not apply route 2 (two) times daily. 1 kit 0  . fluconazole (DIFLUCAN) 150 MG tablet Take 1 tablet (150 mg total) by mouth as needed (take 1 as needed for vaginal yeast infection). 1 tablet 2  . furosemide (LASIX) 20 MG tablet Take 1 tablet (20 mg total) by mouth daily. 30 tablet 5  . glucose  blood (ACCU-CHEK SMARTVIEW) test strip 3 to 4  times daily before meals, diag code E11.22, insulin requiring 100 each 6  . insulin aspart protamine- aspart (NOVOLOG MIX 70/30) (70-30) 100 UNIT/ML injection Inject 0.3 mLs (30 Units total) into the skin 2 (two) times daily with a meal. 10 mL 11  . Insulin Syringe-Needle U-100 31G X 15/64" 0.3 ML MISC 1 each by Does not apply route 2 (two) times daily. Use to inject insulin twice daily dx code E11.22. Insulin dependent 100 each 6  . lisinopril (PRINIVIL,ZESTRIL) 20 MG tablet Take 1 tablet (20 mg total) by mouth daily. 30 tablet 5  . metFORMIN (GLUCOPHAGE) 1000 MG tablet Take 1 tablet (1,000 mg total) by mouth 2 (two) times daily. 180 tablet 5  . metoprolol tartrate (LOPRESSOR) 25 MG tablet Take 1 tablet (25 mg total) by mouth 2 (two) times daily. 60 tablet 5  . atorvastatin (LIPITOR) 40 MG tablet Take 1 tablet (40 mg total) by mouth daily. 30 tablet 5   No current facility-administered medications for this visit.    Allergies:   Codeine; Lyrica; and Tramadol    Social History:  The patient  reports that she quit smoking about 2 years ago. Her smoking use included Cigarettes. She smoked 0.20 packs per day. She quit smokeless tobacco use about 20 months ago. She reports that she drinks alcohol. She reports that she does not use illicit drugs.   Family History:  The patient's family history includes Cancer in her father; Diabetes in her mother.    ROS:  Please see the history of present illness.   Otherwise, review of systems are positive for none.   All other systems are reviewed and negative.    PHYSICAL EXAM: VS:  BP 124/76 mmHg  Ht '5\' 3"'$  (1.6 m)  Wt 87.998 kg (194 lb)  BMI 34.37 kg/m2 , BMI Body mass index is 34.37 kg/(m^2). GENERAL:  Well appearing HEENT:  Pupils equal round and reactive, fundi not visualized, oral mucosa unremarkable NECK:  No jugular venous distention, waveform within normal limits, carotid upstroke brisk and symmetric,  no bruits, no thyromegaly LYMPHATICS:  No cervical adenopathy LUNGS: Expiratory wheezes and rhonchi bilaterally.  No crackles.   HEART:  RRR.  PMI not displaced or sustained,S1 and S2 within normal limits, no S3, no S4, no clicks, no rubs,  no murmurs ABD:  Flat, positive bowel sounds normal in frequency in pitch, no bruits, no rebound, no guarding, no midline pulsatile mass, no hepatomegaly, no splenomegaly EXT:  2 plus pulses throughout, no edema, no cyanosis no clubbing SKIN:  No rashes no nodules NEURO:  Cranial nerves II through XII grossly intact, motor grossly intact throughout PSYCH:  Cognitively intact, oriented to person place and time   EKG:  EKG is ordered today. The ekg ordered today demonstrates sinus rhythm.  Rate 96 bpm.  R atrial enlargement.  L axis deviation.    Recent Labs: 12/01/2015: ALT 12*; B Natriuretic Peptide 308.8*; Hemoglobin 11.5*; Platelets 348 01/31/2016: BUN 16; Creatinine, Ser 1.00; Potassium 4.6; Sodium 131*    Lipid Panel    Component Value Date/Time   CHOL 268* 02/10/2015 1507   TRIG 126 02/10/2015 1507   HDL 78 02/10/2015 1507   CHOLHDL 3.4 02/10/2015 1507   VLDL 25 02/10/2015 1507   LDLCALC 165* 02/10/2015 1507      Wt Readings from Last 3 Encounters:  02/23/16 87.998 kg (194 lb)  01/31/16 88.089 kg (194 lb 3.2 oz)  01/03/16 90.992 kg (200 lb 9.6 oz)      ASSESSMENT AND PLAN:  # Chronic systolic heart failure: # Angina:  Ms. Patti has newly-diagnosed systolic heart failure with wall motion abnormalities in the septum and inferior walls.  She also has chest pain.  Therefore, we will refer her for cardiac catheterization. Risks and benefits of cardiac catheterization have been discussed with the patient.  The patient understands that risks included but are not limited to stroke (1 in 1000), death (1 in 70), kidney failure [usually temporary] (1 in 500), bleeding (1 in 200), allergic reaction [possibly serious] (1 in 200). The patient  understands and agrees to proceed. She does not appear to be volume overloaded on exam.  We will check a BNP   # Cough: Ms. Pasqual has a cough that has been ongoing for 6 weeks and is mildly productive.  She does not appear to be infected.  However, she is wheezing and has rhonchi on exam.  We will obtain a CXR and CBC.  We will also stop lisinopril and start losartan 50 mg daily. .  # Hypertensive heart disease: Blood pressure is well-controlled.  We will switch lisinopril to losartan as above to avoid cough.  Continue metoprolol.  #  Hyperlipidemia: Continue atorvastatin.   Current medicines are reviewed at length with the patient today.  The patient does not have concerns regarding medicines.  The following changes have been made:  Stop lisinopril.  Start losartan 50 mg daily.   Labs/ tests ordered today include:  No orders of the defined types were placed in this encounter.     Disposition:   FU with Riku Buttery C. Oval Linsey, MD, [facility] in 1 month.     This note was written with the assistance of speech recognition software.  Please excuse any transcriptional errors.  Signed, Brendolyn Stockley C. Oval Linsey, MD, [facility]  02/23/2016 11:03 AM    [place]

## 2016-02-23 NOTE — Patient Instructions (Signed)
Medication Instructions:  STOP LISINOPRIL  START LOSARTAN 50 MG DAILY   START SPIRIVA ONCE DAILY  START ALBUTEROL 2 PUFFS EVERY 6 HOURS AS NEEDED FOR SHORTNESS OF BREATH   Labwork: BMET/PTT/PT/INR/CBC/BNP AT [facility] ON THE FIRST FLOOR  Testing/Procedures: Your physician has requested that you have a cardiac catheterization. Cardiac catheterization is used to diagnose and/or treat various heart conditions. Doctors may recommend this procedure for a number of different reasons. The most common reason is to evaluate chest pain. Chest pain can be a symptom of coronary artery disease (CAD), and cardiac catheterization can show whether plaque is narrowing or blocking your heart's arteries. This procedure is also used to evaluate the valves, as well as measure the blood flow and oxygen levels in different parts of your heart. For further information please visit [email or web address]. Please follow instruction sheet, as given.  A chest x-ray takes a picture of the organs and structures inside the chest, including the heart, lungs, and blood vessels. This test can show several things, including, whether the heart is enlarges; whether fluid is building up in the lungs; and whether pacemaker / defibrillator leads are still in place.  [facility]  Follow-Up: Your physician recommends that you schedule a follow-up appointment in: 1 MONTH   FOLLOW UP WITH YOUR PRIMARY CARE DOCTOR FOR YOUR LUNG PROBLEMS  If you need a refill on your cardiac medications before your next appointment, please call your pharmacy.

## 2016-02-25 ENCOUNTER — Encounter: Payer: Self-pay | Admitting: Cardiovascular Disease

## 2016-02-28 ENCOUNTER — Ambulatory Visit
Admission: RE | Admit: 2016-02-28 | Discharge: 2016-02-28 | Disposition: A | Discharge status: Home / Self Care | Payer: Medicare Other | Source: Ambulatory Visit | Attending: Cardiovascular Disease | Admitting: Cardiovascular Disease

## 2016-02-28 DIAGNOSIS — R079 Chest pain, unspecified: Secondary | ICD-10-CM | POA: Diagnosis not present

## 2016-02-28 DIAGNOSIS — R0602 Shortness of breath: Secondary | ICD-10-CM

## 2016-02-28 DIAGNOSIS — R05 Cough: Secondary | ICD-10-CM | POA: Diagnosis not present

## 2016-02-28 LAB — CBC WITH DIFFERENTIAL/PLATELET
Basophils Absolute: 0 cells/uL (ref 0–200)
Basophils Relative: 0 %
Eosinophils Absolute: 126 cells/uL (ref 15–500)
Eosinophils Relative: 2 %
HEMATOCRIT: 40.8 % (ref 35.0–45.0)
HEMOGLOBIN: 12.9 g/dL (ref 11.7–15.5)
LYMPHS ABS: 2457 {cells}/uL (ref 850–3900)
Lymphocytes Relative: 39 %
MCH: 24.7 pg — ABNORMAL LOW (ref 27.0–33.0)
MCHC: 31.6 g/dL — AB (ref 32.0–36.0)
MCV: 78.2 fL — ABNORMAL LOW (ref 80.0–100.0)
MONO ABS: 567 {cells}/uL (ref 200–950)
MPV: 9.9 fL (ref 7.5–12.5)
Monocytes Relative: 9 %
NEUTROS ABS: 3150 {cells}/uL (ref 1500–7800)
NEUTROS PCT: 50 %
Platelets: 339 10*3/uL (ref 140–400)
RBC: 5.22 MIL/uL — AB (ref 3.80–5.10)
RDW: 15.7 % — ABNORMAL HIGH (ref 11.0–15.0)
WBC: 6.3 10*3/uL (ref 3.8–10.8)

## 2016-02-28 LAB — PROTIME-INR
INR: 0.92 (ref <1.50)
PROTHROMBIN TIME: 12.5 s (ref 11.6–15.2)

## 2016-02-28 LAB — APTT: aPTT: 26 seconds (ref 24–37)

## 2016-02-29 ENCOUNTER — Other Ambulatory Visit: Payer: Self-pay | Admitting: *Deleted

## 2016-02-29 DIAGNOSIS — Z01818 Encounter for other preprocedural examination: Secondary | ICD-10-CM

## 2016-02-29 DIAGNOSIS — R931 Abnormal findings on diagnostic imaging of heart and coronary circulation: Secondary | ICD-10-CM

## 2016-02-29 LAB — BASIC METABOLIC PANEL
BUN: 8 mg/dL (ref 7–25)
CHLORIDE: 103 mmol/L (ref 98–110)
CO2: 25 mmol/L (ref 20–31)
Calcium: 8.7 mg/dL (ref 8.6–10.4)
Creat: 0.73 mg/dL (ref 0.50–1.05)
GLUCOSE: 245 mg/dL — AB (ref 65–99)
POTASSIUM: 4.2 mmol/L (ref 3.5–5.3)
Sodium: 139 mmol/L (ref 135–146)

## 2016-02-29 LAB — BRAIN NATRIURETIC PEPTIDE: Brain Natriuretic Peptide: 118.4 pg/mL — ABNORMAL HIGH (ref <100)

## 2016-03-01 ENCOUNTER — Telehealth: Payer: Self-pay | Admitting: *Deleted

## 2016-03-01 DIAGNOSIS — I5042 Chronic combined systolic (congestive) and diastolic (congestive) heart failure: Secondary | ICD-10-CM

## 2016-03-01 NOTE — Telephone Encounter (Signed)
-----   Message from Chilton Siiffany [place], MD sent at 02/29/2016 10:17 AM EDT ----- Normal heart and lungs on xray.  There is some plaque n the aorta.  Start aspirin 81 mg daily.

## 2016-03-01 NOTE — Telephone Encounter (Signed)
Left message to call back  

## 2016-03-01 NOTE — Telephone Encounter (Signed)
Advised patient

## 2016-03-01 NOTE — Telephone Encounter (Signed)
-----   Message from Chilton Siiffany [place], MD sent at 03/01/2016  5:41 PM EDT ----- BNP is elevated indicating that her heart failure is not well-controlled.  Increase lasix to 40 mg daily.  This may also help her cough.  Blood glucose levels are elevated.  Please work with PCP on glucose control.

## 2016-03-04 MED ORDER — FUROSEMIDE 40 MG PO TABS: 40.0000 mg | ORAL_TABLET | Freq: Every day | ORAL | Status: DC

## 2016-03-04 NOTE — Telephone Encounter (Signed)
[facility]-pls call [phone]

## 2016-03-04 NOTE — Telephone Encounter (Signed)
Results discussed, recommendations given. Lasix 40mg  Rx sent to pt's preferred pharmacy. Referred to primary for glucose control. Advised to call for any needs.

## 2016-03-05 ENCOUNTER — Ambulatory Visit (HOSPITAL_COMMUNITY)
Admission: RE | Admit: 2016-03-05 | Discharge: 2016-03-05 | Disposition: A | Discharge status: Home / Self Care | Payer: Medicare Other | Source: Ambulatory Visit | Attending: Interventional Cardiology | Admitting: Interventional Cardiology

## 2016-03-05 ENCOUNTER — Encounter (HOSPITAL_COMMUNITY): Payer: Self-pay | Admitting: Interventional Cardiology

## 2016-03-05 ENCOUNTER — Telehealth: Payer: Self-pay

## 2016-03-05 ENCOUNTER — Encounter (HOSPITAL_COMMUNITY)
Admission: RE | Disposition: A | Discharge status: Home / Self Care | Payer: Self-pay | Source: Ambulatory Visit | Attending: Interventional Cardiology

## 2016-03-05 DIAGNOSIS — M869 Osteomyelitis, unspecified: Secondary | ICD-10-CM | POA: Insufficient documentation

## 2016-03-05 DIAGNOSIS — R079 Chest pain, unspecified: Secondary | ICD-10-CM

## 2016-03-05 DIAGNOSIS — I209 Angina pectoris, unspecified: Secondary | ICD-10-CM | POA: Insufficient documentation

## 2016-03-05 DIAGNOSIS — Z7984 Long term (current) use of oral hypoglycemic drugs: Secondary | ICD-10-CM | POA: Insufficient documentation

## 2016-03-05 DIAGNOSIS — R0782 Intercostal pain: Secondary | ICD-10-CM | POA: Insufficient documentation

## 2016-03-05 DIAGNOSIS — E1169 Type 2 diabetes mellitus with other specified complication: Secondary | ICD-10-CM | POA: Insufficient documentation

## 2016-03-05 DIAGNOSIS — Z794 Long term (current) use of insulin: Secondary | ICD-10-CM | POA: Insufficient documentation

## 2016-03-05 DIAGNOSIS — E119 Type 2 diabetes mellitus without complications: Secondary | ICD-10-CM | POA: Insufficient documentation

## 2016-03-05 DIAGNOSIS — Z8249 Family history of ischemic heart disease and other diseases of the circulatory system: Secondary | ICD-10-CM | POA: Insufficient documentation

## 2016-03-05 DIAGNOSIS — N182 Chronic kidney disease, stage 2 (mild): Secondary | ICD-10-CM

## 2016-03-05 DIAGNOSIS — Z87891 Personal history of nicotine dependence: Secondary | ICD-10-CM | POA: Diagnosis not present

## 2016-03-05 DIAGNOSIS — R931 Abnormal findings on diagnostic imaging of heart and coronary circulation: Secondary | ICD-10-CM

## 2016-03-05 DIAGNOSIS — Z01818 Encounter for other preprocedural examination: Secondary | ICD-10-CM

## 2016-03-05 DIAGNOSIS — D509 Iron deficiency anemia, unspecified: Secondary | ICD-10-CM | POA: Diagnosis present

## 2016-03-05 DIAGNOSIS — E7800 Pure hypercholesterolemia, unspecified: Secondary | ICD-10-CM | POA: Insufficient documentation

## 2016-03-05 DIAGNOSIS — E1122 Type 2 diabetes mellitus with diabetic chronic kidney disease: Secondary | ICD-10-CM | POA: Diagnosis present

## 2016-03-05 DIAGNOSIS — I110 Hypertensive heart disease with heart failure: Secondary | ICD-10-CM | POA: Insufficient documentation

## 2016-03-05 DIAGNOSIS — I5042 Chronic combined systolic (congestive) and diastolic (congestive) heart failure: Secondary | ICD-10-CM | POA: Diagnosis not present

## 2016-03-05 DIAGNOSIS — I5041 Acute combined systolic (congestive) and diastolic (congestive) heart failure: Secondary | ICD-10-CM | POA: Diagnosis present

## 2016-03-05 DIAGNOSIS — E785 Hyperlipidemia, unspecified: Secondary | ICD-10-CM | POA: Diagnosis present

## 2016-03-05 HISTORY — PX: CARDIAC CATHETERIZATION: SHX172

## 2016-03-05 LAB — GLUCOSE, CAPILLARY
GLUCOSE-CAPILLARY: 241 mg/dL — AB (ref 65–99)
Glucose-Capillary: 214 mg/dL — ABNORMAL HIGH (ref 65–99)

## 2016-03-05 SURGERY — LEFT HEART CATH AND CORONARY ANGIOGRAPHY
Anesthesia: LOCAL

## 2016-03-05 MED ORDER — HEPARIN SODIUM (PORCINE) 1000 UNIT/ML IJ SOLN
INTRAMUSCULAR | Status: DC | PRN
  Administered 2016-03-05: 4500 [IU] via INTRAVENOUS

## 2016-03-05 MED ORDER — VERAPAMIL HCL 2.5 MG/ML IV SOLN
INTRAVENOUS | Status: AC
Start: 2016-03-05 — End: 2016-03-05
  Filled 2016-03-05: qty 2

## 2016-03-05 MED ORDER — LIDOCAINE HCL (PF) 1 % IJ SOLN
INTRAMUSCULAR | Status: AC
Start: 2016-03-05 — End: 2016-03-05
  Filled 2016-03-05: qty 30

## 2016-03-05 MED ORDER — SODIUM CHLORIDE 0.9 % IV SOLN
INTRAVENOUS | Status: DC
  Administered 2016-03-05: 08:00:00 via INTRAVENOUS

## 2016-03-05 MED ORDER — SODIUM CHLORIDE 0.9% FLUSH: 3.0000 mL | INTRAVENOUS | Status: DC | PRN

## 2016-03-05 MED ORDER — HEPARIN (PORCINE) IN NACL 2-0.9 UNIT/ML-% IJ SOLN
INTRAMUSCULAR | Status: AC
Start: 2016-03-05 — End: 2016-03-05
  Filled 2016-03-05: qty 1000

## 2016-03-05 MED ORDER — VERAPAMIL HCL 2.5 MG/ML IV SOLN
INTRAVENOUS | Status: DC | PRN
  Administered 2016-03-05: 10:00:00 via INTRA_ARTERIAL

## 2016-03-05 MED ORDER — HEPARIN (PORCINE) IN NACL 2-0.9 UNIT/ML-% IJ SOLN
INTRAMUSCULAR | Status: DC | PRN
  Administered 2016-03-05: 1000 mL

## 2016-03-05 MED ORDER — FENTANYL CITRATE (PF) 100 MCG/2ML IJ SOLN
INTRAMUSCULAR | Status: AC
  Filled 2016-03-05: qty 2

## 2016-03-05 MED ORDER — HEPARIN SODIUM (PORCINE) 1000 UNIT/ML IJ SOLN
INTRAMUSCULAR | Status: AC
  Filled 2016-03-05: qty 1

## 2016-03-05 MED ORDER — FENTANYL CITRATE (PF) 100 MCG/2ML IJ SOLN
INTRAMUSCULAR | Status: DC | PRN
  Administered 2016-03-05: 50 ug via INTRAVENOUS

## 2016-03-05 MED ORDER — MIDAZOLAM HCL 2 MG/2ML IJ SOLN
INTRAMUSCULAR | Status: DC | PRN
  Administered 2016-03-05 (×2): 1 mg via INTRAVENOUS

## 2016-03-05 MED ORDER — LIDOCAINE HCL (PF) 1 % IJ SOLN
INTRAMUSCULAR | Status: DC | PRN
  Administered 2016-03-05: 3 mL via SUBCUTANEOUS

## 2016-03-05 MED ORDER — ASPIRIN 81 MG PO CHEW
CHEWABLE_TABLET | ORAL | Status: AC
  Administered 2016-03-05: 81 mg via ORAL
  Filled 2016-03-05: qty 1

## 2016-03-05 MED ORDER — IOPAMIDOL (ISOVUE-370) INJECTION 76%
INTRAVENOUS | Status: DC | PRN
  Administered 2016-03-05: 65 mL via INTRA_ARTERIAL

## 2016-03-05 MED ORDER — ASPIRIN 81 MG PO CHEW
81.0000 mg | CHEWABLE_TABLET | ORAL | Status: AC
  Administered 2016-03-05: 81 mg via ORAL

## 2016-03-05 MED ORDER — MIDAZOLAM HCL 2 MG/2ML IJ SOLN
INTRAMUSCULAR | Status: AC
  Filled 2016-03-05: qty 2

## 2016-03-05 SURGICAL SUPPLY — 9 items
CATH INFINITI 5 FR JL3.5 (CATHETERS) ×1 IMPLANT
CATH INFINITI JR4 5F (CATHETERS) ×1 IMPLANT
DEVICE RAD COMP TR BAND LRG (VASCULAR PRODUCTS) ×1 IMPLANT
GLIDESHEATH SLEND A-KIT 6F 22G (SHEATH) ×1 IMPLANT
KIT HEART LEFT (KITS) ×2 IMPLANT
PACK CARDIAC CATHETERIZATION (CUSTOM PROCEDURE TRAY) ×2 IMPLANT
TRANSDUCER W/STOPCOCK (MISCELLANEOUS) ×2 IMPLANT
TUBING CIL FLEX 10 FLL-RA (TUBING) ×2 IMPLANT
WIRE SAFE-T 1.5MM-J .035X260CM (WIRE) ×1 IMPLANT

## 2016-03-05 NOTE — Telephone Encounter (Signed)
Please call pt back.

## 2016-03-05 NOTE — Interval H&P Note (Signed)
Cath Lab Visit (complete for each Cath Lab visit)  Clinical Evaluation Leading to the Procedure:   ACS: No.  Non-ACS:    Anginal Classification: CCS III  Anti-ischemic medical therapy: Minimal Therapy (1 class of medications)  Non-Invasive Test Results: No non-invasive testing performed  Prior CABG: No previous CABG      History and Physical Interval Note:  03/05/2016 10:05 AM  Linda Miller  has presented today for surgery, with the diagnosis of angina  The various methods of treatment have been discussed with the patient and family. After consideration of risks, benefits and other options for treatment, the patient has consented to  Procedure(s): Left Heart Cath and Coronary Angiography (N/A) as a surgical intervention .  The patient's history has been reviewed, patient examined, no change in status, stable for surgery.  I have reviewed the patient's chart and labs.  Questions were answered to the patient's satisfaction.     Lesleigh NoeSMITH III,Tao Satz W

## 2016-03-05 NOTE — Discharge Instructions (Signed)
Radial Site Care Refer to this sheet in the next few weeks. These instructions provide you with information about caring for yourself after your procedure. Your health care provider may also give you more specific instructions. Your treatment has been planned according to current medical practices, but problems sometimes occur. Call your health care provider if you have any problems or questions after your procedure. WHAT TO EXPECT AFTER THE PROCEDURE After your procedure, it is typical to have the following:  Bruising at the radial site that usually fades within 1-2 weeks.  Blood collecting in the tissue (hematoma) that may be painful to the touch. It should usually decrease in size and tenderness within 1-2 weeks. HOME CARE INSTRUCTIONS  Take medicines only as directed by your health care provider.  You may shower 24-48 hours after the procedure or as directed by your health care provider. Remove the bandage (dressing) and gently wash the site with plain soap and water. Pat the area dry with a clean towel. Do not rub the site, because this may cause bleeding.  Do not take baths, swim, or use a hot tub until your health care provider approves.  Check your insertion site every day for redness, swelling, or drainage.  Do not apply powder or lotion to the site.  Do not flex or bend the affected arm for 24 hours or as directed by your health care provider.  Do not push or pull heavy objects with the affected arm for 24 hours or as directed by your health care provider.  Do not lift over 10 lb (4.5 kg) for 5 days after your procedure or as directed by your health care provider.  Ask your health care provider when it is okay to:  Return to work or school.  Resume usual physical activities or sports.  Resume sexual activity.  Do not drive home if you are discharged the same day as the procedure. Have someone else drive you.  You may drive 24 hours after the procedure unless otherwise  instructed by your health care provider.  Do not operate machinery or power tools for 24 hours after the procedure.  If your procedure was done as an outpatient procedure, which means that you went home the same day as your procedure, a responsible adult should be with you for the first 24 hours after you arrive home.  Keep all follow-up visits as directed by your health care provider. This is important. SEEK MEDICAL CARE IF:  You have a fever.  You have chills.  You have increased bleeding from the radial site. Hold pressure on the site. SEEK IMMEDIATE MEDICAL CARE IF:  You have unusual pain at the radial site.  You have redness, warmth, or swelling at the radial site.  You have drainage (other than a small amount of blood on the dressing) from the radial site.  The radial site is bleeding, and the bleeding does not stop after 30 minutes of holding steady pressure on the site.  Your arm or hand becomes pale, cool, tingly, or numb.   This information is not intended to replace advice given to you by your health care provider. Make sure you discuss any questions you have with your health care provider.   Document Released: 12/14/2010 Document Revised: 12/02/2014 Document Reviewed: 05/30/2014 Elsevier Interactive Patient Education 2016    HOLD METFORMIN FOR 48 HOURS

## 2016-03-05 NOTE — Research (Signed)
[place] Study Informed Consent   Subject Name: Linda Miller  Subject met inclusion and exclusion criteria.  The informed consent form, study requirements and expectations were reviewed with the subject and questions and concerns were addressed prior to the signing of the consent form.  The subject verbalized understanding of the trial requirements.  The subject agreed to participate in the [place] trial and signed the informed consent at 0830 on 03/05/2016.  The informed consent was obtained prior to performance of any protocol-specific procedures for the subject.  A copy of the signed informed consent was given to the subject and a copy was placed in the subject's medical record.  Blossom Hoops 03/05/2016, 9:17 AM

## 2016-03-05 NOTE — H&P (View-Only) (Signed)
Cardiology Office Note   Date:  02/23/2016   ID:  Linda Miller, Linda Miller 11/13/1956, MRN [id number]  PCP:  Juluis Mire, MD  Cardiologist:   Sharol Harness, MD   Chief Complaint  Patient presents with  . New Evaluation    Referred by Dr. Ronnald Ramp  pt c/o sharp chest tightness/pressure under left breast and in neck; SOB on minimal exertion; Dizziness--occasionally feels like she is going to faint; swelling in legs/feet/ankles/hands--noticed yesterday; Fatigue--feels like she never gets enough sleep; and persistent cough--will not go away, has had for more than 6 weeks      History of Present Illness: Linda Miller is a 60 y.o. 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  Linda Miller saw her PCP  In February with a complaint of dyspnea on exertion.  She was referred for an echo that showed LVEF 45% with inferior and septal wall hypokinesis.  She was started on lasix and metoprolol.  She was referred to cardiology for evaluation.   She reports that she hasn't been feeling well.  She notes fatigue and sleeping all the time.  She has noted tingling and pain in her L arm and leg.   She notes shortness of breath with minimal exertion.  She reports L sided chest pain and a pulling sensation.  The pain lasts for up to hours at a time.  It sometimes improves with massage or a warm towel.  The pain is 8-9/10 in severity and there is no associated nausea or diaphoresis.  Linda Miller also reports gait instability and has been falling 2-3 times daily.  She feels like her legs are giving out underneath her.  She sometimes notes chest pain prior to falling.  Of note, her sister had a heart attack at age 24.  She has been coughing for over six weeks.  She tried an inhaler that does not help.  The cough is productive of white phlegm.  She has been taking lisinopril for over a  year.   She tries to exercise by walking up the stairs at her house.  She has not been exercising much lately because of increased shortness of breath.  She also endorses dizziness that is separate from her falls.  It occurs in all position.  It can occur when laying down, sitting or standing.  She has noted LE edema that has improved with lasix.  She denies orthopnea or PND but has to cough when she lays down.  She continues to smoke 2-3 cigarettes per week.  She has been smoking since age 29.  Past Medical History  Diagnosis Date  . Diabetes mellitus   . Hypertension   . High cholesterol   . Osteomyelitis of right foot [facility])     Past Surgical History  Procedure Laterality Date  . Abdominal hysterectomy    . Debridement  foot    . I&d extremity  09/23/2012    Procedure: IRRIGATION AND DEBRIDEMENT EXTREMITY;  Surgeon: Newt Minion, MD;  Location: [place];  Service: Orthopedics;  Laterality: Right;  . Tee without cardioversion  09/24/2012    Procedure: TRANSESOPHAGEAL ECHOCARDIOGRAM (TEE);  Surgeon: Larey Dresser, MD;  Location: [facility] ENDOSCOPY;  Service: Cardiovascular;  Laterality: N/A;     Current Outpatient Prescriptions  Medication Sig Dispense Refill  . ACCU-CHEK FASTCLIX LANCETS MISC Use to check blood sugar 3 to 4 times daily.Insulin requiring.  dx  code E11.22 102 each 6  . acetaminophen-codeine (TYLENOL #3) 300-30 MG tablet TAKE 1 TABLET BY MOUTH EVERY 8 HOURS AS NEEDED FOR MODERATE PAIN 30 tablet 0  . amitriptyline (ELAVIL) 50 MG tablet Take 2 tablets (100 mg total) by mouth at bedtime. 30 tablet 5  . Blood Glucose Monitoring Suppl (ACCU-CHEK NANO SMARTVIEW) W/DEVICE KIT 1 each by Does not apply route 2 (two) times daily. 1 kit 0  . fluconazole (DIFLUCAN) 150 MG tablet Take 1 tablet (150 mg total) by mouth as needed (take 1 as needed for vaginal yeast infection). 1 tablet 2  . furosemide (LASIX) 20 MG tablet Take 1 tablet (20 mg total) by mouth daily. 30 tablet 5  . glucose  blood (ACCU-CHEK SMARTVIEW) test strip 3 to 4  times daily before meals, diag code E11.22, insulin requiring 100 each 6  . insulin aspart protamine- aspart (NOVOLOG MIX 70/30) (70-30) 100 UNIT/ML injection Inject 0.3 mLs (30 Units total) into the skin 2 (two) times daily with a meal. 10 mL 11  . Insulin Syringe-Needle U-100 31G X 15/64" 0.3 ML MISC 1 each by Does not apply route 2 (two) times daily. Use to inject insulin twice daily dx code E11.22. Insulin dependent 100 each 6  . lisinopril (PRINIVIL,ZESTRIL) 20 MG tablet Take 1 tablet (20 mg total) by mouth daily. 30 tablet 5  . metFORMIN (GLUCOPHAGE) 1000 MG tablet Take 1 tablet (1,000 mg total) by mouth 2 (two) times daily. 180 tablet 5  . metoprolol tartrate (LOPRESSOR) 25 MG tablet Take 1 tablet (25 mg total) by mouth 2 (two) times daily. 60 tablet 5  . atorvastatin (LIPITOR) 40 MG tablet Take 1 tablet (40 mg total) by mouth daily. 30 tablet 5   No current facility-administered medications for this visit.    Allergies:   Codeine; Lyrica; and Tramadol    Social History:  The patient  reports that she quit smoking about 2 years ago. Her smoking use included Cigarettes. She smoked 0.20 packs per day. She quit smokeless tobacco use about 20 months ago. She reports that she drinks alcohol. She reports that she does not use illicit drugs.   Family History:  The patient's family history includes Cancer in her father; Diabetes in her mother.    ROS:  Please see the history of present illness.   Otherwise, review of systems are positive for none.   All other systems are reviewed and negative.    PHYSICAL EXAM: VS:  BP 124/76 mmHg  Ht '5\' 3"'$  (1.6 m)  Wt 87.998 kg (194 lb)  BMI 34.37 kg/m2 , BMI Body mass index is 34.37 kg/(m^2). GENERAL:  Well appearing HEENT:  Pupils equal round and reactive, fundi not visualized, oral mucosa unremarkable NECK:  No jugular venous distention, waveform within normal limits, carotid upstroke brisk and symmetric,  no bruits, no thyromegaly LYMPHATICS:  No cervical adenopathy LUNGS: Expiratory wheezes and rhonchi bilaterally.  No crackles.   HEART:  RRR.  PMI not displaced or sustained,S1 and S2 within normal limits, no S3, no S4, no clicks, no rubs,  no murmurs ABD:  Flat, positive bowel sounds normal in frequency in pitch, no bruits, no rebound, no guarding, no midline pulsatile mass, no hepatomegaly, no splenomegaly EXT:  2 plus pulses throughout, no edema, no cyanosis no clubbing SKIN:  No rashes no nodules NEURO:  Cranial nerves II through XII grossly intact, motor grossly intact throughout PSYCH:  Cognitively intact, oriented to person place and time   EKG:  EKG is ordered today. The ekg ordered today demonstrates sinus rhythm.  Rate 96 bpm.  R atrial enlargement.  L axis deviation.    Recent Labs: 12/01/2015: ALT 12*; B Natriuretic Peptide 308.8*; Hemoglobin 11.5*; Platelets 348 01/31/2016: BUN 16; Creatinine, Ser 1.00; Potassium 4.6; Sodium 131*    Lipid Panel    Component Value Date/Time   CHOL 268* 02/10/2015 1507   TRIG 126 02/10/2015 1507   HDL 78 02/10/2015 1507   CHOLHDL 3.4 02/10/2015 1507   VLDL 25 02/10/2015 1507   LDLCALC 165* 02/10/2015 1507      Wt Readings from Last 3 Encounters:  02/23/16 87.998 kg (194 lb)  01/31/16 88.089 kg (194 lb 3.2 oz)  01/03/16 90.992 kg (200 lb 9.6 oz)      ASSESSMENT AND PLAN:  # Chronic systolic heart failure: # Angina:  Linda Miller has newly-diagnosed systolic heart failure with wall motion abnormalities in the septum and inferior walls.  She also has chest pain.  Therefore, we will refer her for cardiac catheterization. Risks and benefits of cardiac catheterization have been discussed with the patient.  The patient understands that risks included but are not limited to stroke (1 in 1000), death (1 in 103), kidney failure [usually temporary] (1 in 500), bleeding (1 in 200), allergic reaction [possibly serious] (1 in 200). The patient  understands and agrees to proceed. She does not appear to be volume overloaded on exam.  We will check a BNP   # Cough: Linda Miller has a cough that has been ongoing for 6 weeks and is mildly productive.  She does not appear to be infected.  However, she is wheezing and has rhonchi on exam.  We will obtain a CXR and CBC.  We will also stop lisinopril and start losartan 50 mg daily. .  # Hypertensive heart disease: Blood pressure is well-controlled.  We will switch lisinopril to losartan as above to avoid cough.  Continue metoprolol.  #  Hyperlipidemia: Continue atorvastatin.   Current medicines are reviewed at length with the patient today.  The patient does not have concerns regarding medicines.  The following changes have been made:  Stop lisinopril.  Start losartan 50 mg daily.   Labs/ tests ordered today include:  No orders of the defined types were placed in this encounter.     Disposition:   FU with Linda Tietje C. Oval Linsey, MD, [facility] in 1 month.     This note was written with the assistance of speech recognition software.  Please excuse any transcriptional errors.  Signed, Leola Fiore C. Oval Linsey, MD, [facility]  02/23/2016 11:03 AM    [place]

## 2016-03-06 ENCOUNTER — Other Ambulatory Visit: Payer: Self-pay | Admitting: *Deleted

## 2016-03-06 DIAGNOSIS — S82401S Unspecified fracture of shaft of right fibula, sequela: Secondary | ICD-10-CM

## 2016-03-06 MED ORDER — ACETAMINOPHEN-CODEINE #3 300-30 MG PO TABS: ORAL_TABLET | ORAL | Status: DC

## 2016-03-06 NOTE — Telephone Encounter (Signed)
Pt was called request sent to md

## 2016-03-13 NOTE — Telephone Encounter (Signed)
Called to pharm 

## 2016-03-19 ENCOUNTER — Other Ambulatory Visit: Payer: Self-pay | Admitting: Internal Medicine

## 2016-03-22 ENCOUNTER — Other Ambulatory Visit: Payer: Self-pay | Admitting: *Deleted

## 2016-03-22 DIAGNOSIS — E785 Hyperlipidemia, unspecified: Secondary | ICD-10-CM

## 2016-03-22 MED ORDER — ATORVASTATIN CALCIUM 40 MG PO TABS: 40.0000 mg | ORAL_TABLET | Freq: Every day | ORAL | Status: DC

## 2016-03-22 MED ORDER — METFORMIN HCL 1000 MG PO TABS: 1000.0000 mg | ORAL_TABLET | Freq: Two times a day (BID) | ORAL | Status: DC

## 2016-03-29 ENCOUNTER — Ambulatory Visit (INDEPENDENT_AMBULATORY_CARE_PROVIDER_SITE_OTHER): Payer: Medicare Other | Admitting: Cardiovascular Disease

## 2016-03-29 ENCOUNTER — Encounter: Payer: Self-pay | Admitting: Cardiovascular Disease

## 2016-03-29 VITALS — BP 80/64 | HR 92 | Ht 63.0 in | Wt 187.0 lb

## 2016-03-29 DIAGNOSIS — K219 Gastro-esophageal reflux disease without esophagitis: Secondary | ICD-10-CM

## 2016-03-29 DIAGNOSIS — R059 Cough, unspecified: Secondary | ICD-10-CM

## 2016-03-29 DIAGNOSIS — R05 Cough: Secondary | ICD-10-CM

## 2016-03-29 DIAGNOSIS — I10 Essential (primary) hypertension: Secondary | ICD-10-CM | POA: Diagnosis not present

## 2016-03-29 DIAGNOSIS — E785 Hyperlipidemia, unspecified: Secondary | ICD-10-CM

## 2016-03-29 HISTORY — DX: Gastro-esophageal reflux disease without esophagitis: K21.9

## 2016-03-29 MED ORDER — PANTOPRAZOLE SODIUM 40 MG PO TBEC: DELAYED_RELEASE_TABLET | ORAL | Status: DC

## 2016-03-29 NOTE — Patient Instructions (Signed)
Medication Instructions:  STOP LISINOPRIL   CONTINUE LOSARTAN   START PROTONIX 40 MG TWICE A DAY FOR 2 WEEK AND THEN ONCE DAILY   Labwork: NONE  Testing/Procedures: NONE   Follow-Up: Your physician recommends that you schedule a follow-up appointment in: 3 MONTH OV  If you need a refill on your cardiac medications before your next appointment, please call your pharmacy.

## 2016-03-29 NOTE — Progress Notes (Signed)
Cardiology Office Note   Date:  03/29/2016   ID:  Linda Miller August 19, 1956, MRN [id number]  PCP:  Juluis Mire, Miller  Cardiologist:   Skeet Latch, Miller   Chief Complaint  Patient presents with  . Follow-up    Lt sided pain 2 weeks ago, cramping in Lt leg yesterday, some dizziness & lightheadedness     History of Present Illness: Linda Miller is a 60 y.o. 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 She had an echo 01/01/16 that showed an ejection fraction of 45-50% with inferior and septal hypokinesis. She subsequently underwent left heart catheterization on 03/05/16 that showed normal coronary arteries were tortuous. Her E ejection fraction on left ventriculography was 65% with an LVEDP of 14.  At her last appointment lisinopril was switched to losartan for cough.  However, she has still been taking the lisinopril as well.  She notes dizziness and lightheadedness.  She had one episode of chest pain on Easter Sunday.  It occurred while cooking and lasted for 5 minutes.  Linda Miller also continues to cough.  She reports severe burning in the center of her chest.  She sometimes awakens coughing and with a sour taste in her mouth.  She no longer eats after 9 pm and tries to avoid spicy foods.  She has never tried any mediations for this.  Linda Miller cut back to 1 cigarette daily.  She has also been working on her diet and lost 10 lb.   Past Medical History  Diagnosis Date  . Diabetes mellitus   . Hypertension   . High cholesterol   . Osteomyelitis of right foot ([place])   . GERD (gastroesophageal reflux disease) 03/29/2016    Past Surgical History  Procedure Laterality Date  . Abdominal hysterectomy    . Debridement  foot    . I&d extremity  09/23/2012    Procedure: IRRIGATION AND DEBRIDEMENT EXTREMITY;  Surgeon: Newt Minion, Miller;  Location: [place];  Service:  Orthopedics;  Laterality: Right;  . Tee without cardioversion  09/24/2012    Procedure: TRANSESOPHAGEAL ECHOCARDIOGRAM (TEE);  Surgeon: Larey Dresser, Miller;  Location: [place];  Service: Cardiovascular;  Laterality: N/A;  . Cardiac catheterization N/A 03/05/2016    Procedure: Left Heart Cath and Coronary Angiography;  Surgeon: Belva Crome, Miller;  Location: [place] CV LAB;  Service: Cardiovascular;  Laterality: N/A;     Current Outpatient Prescriptions  Medication Sig Dispense Refill  . ACCU-CHEK FASTCLIX LANCETS MISC Use to check blood sugar 3 to 4 times daily.Insulin requiring.  dx code E11.22 102 each 6  . acetaminophen-codeine (TYLENOL #3) 300-30 MG tablet TAKE 1 TABLET BY MOUTH EVERY 8 HOURS AS NEEDED FOR MODERATE PAIN 30 tablet 0  . albuterol (PROVENTIL HFA;VENTOLIN HFA) 108 (90 Base) MCG/ACT inhaler Inhale 2 puffs into the lungs every 6 (six) hours as needed for wheezing or shortness of breath. 1 Inhaler 1  . amitriptyline (ELAVIL) 50 MG tablet Take 2 tablets (100 mg total) by mouth at bedtime. 30 tablet 5  . aspirin 81 MG tablet Take 81 mg by mouth daily.    Marland Kitchen atorvastatin (LIPITOR) 40 MG tablet Take 1 tablet (40 mg total) by mouth daily. 30 tablet 5  . Blood Glucose Monitoring Suppl (ACCU-CHEK NANO SMARTVIEW) W/DEVICE KIT 1 each by Does not apply route 2 (two) times daily. 1 kit 0  . fluconazole (DIFLUCAN) 150 MG tablet  TAKE 1 TABLET (150 MG TOTAL) BY MOUTH AS NEEDED (TAKE 1 AS NEEDED FOR VAGINAL YEAST INFECTION). 1 tablet 4  . furosemide (LASIX) 40 MG tablet Take 1 tablet (40 mg total) by mouth daily. 30 tablet 5  . glucose blood (ACCU-CHEK SMARTVIEW) test strip 3 to 4  times daily before meals, diag code E11.22, insulin requiring 100 each 6  . insulin aspart protamine- aspart (NOVOLOG MIX 70/30) (70-30) 100 UNIT/ML injection Inject 0.3 mLs (30 Units total) into the skin 2 (two) times daily with a meal. 10 mL 11  . Insulin Syringe-Needle U-100 31G X 15/64" 0.3 ML MISC 1 each by  Does not apply route 2 (two) times daily. Use to inject insulin twice daily dx code E11.22. Insulin dependent 100 each 6  . losartan (COZAAR) 50 MG tablet Take 1 tablet (50 mg total) by mouth daily. 30 tablet 5  . metFORMIN (GLUCOPHAGE) 1000 MG tablet Take 1 tablet (1,000 mg total) by mouth 2 (two) times daily. 180 tablet 2  . metoprolol tartrate (LOPRESSOR) 25 MG tablet Take 1 tablet (25 mg total) by mouth 2 (two) times daily. 60 tablet 5  . pantoprazole (PROTONIX) 40 MG tablet BY MOUTH TWICE A DAY FOR 2 WEEKS AND THEN ONCE A DAY 60 tablet 5   No current facility-administered medications for this visit.    Allergies:   Coconut fatty acids; Codeine; Tramadol; Latex; and Lyrica    Social History:  The patient  reports that she has been smoking Cigarettes.  She has been smoking about 0.20 packs per day. She quit smokeless tobacco use about 21 months ago. She reports that she drinks alcohol. She reports that she does not use illicit drugs.   Family History:  The patient's family history includes CAD in her mother and sister; Cancer in her father; Diabetes in her mother; Stroke in her mother.    ROS:  Please see the history of present illness.   Otherwise, review of systems are positive for none.   All other systems are reviewed and negative.    PHYSICAL EXAM: VS:  BP 80/64 mmHg  Pulse 92  Ht '5\' 3"'$  (1.6 m)  Wt 84.823 kg (187 lb)  BMI 33.13 kg/m2 , BMI Body mass index is 33.13 kg/(m^2). GENERAL:  Well appearing HEENT:  Pupils equal round and reactive, fundi not visualized, oral mucosa unremarkable NECK:  No jugular venous distention, waveform within normal limits, carotid upstroke brisk and symmetric, no bruits, no thyromegaly LYMPHATICS:  No cervical adenopathy LUNGS: Expiratory wheezes and rhonchi bilaterally.  No crackles.   HEART:  RRR.  PMI not displaced or sustained,S1 and S2 within normal limits, no S3, no S4, no clicks, no rubs,  no murmurs ABD:  Flat, positive bowel sounds normal  in frequency in pitch, no bruits, no rebound, no guarding, no midline pulsatile mass, no hepatomegaly, no splenomegaly EXT:  2 plus pulses throughout, no edema, no cyanosis no clubbing SKIN:  No rashes no nodules NEURO:  Cranial nerves II through XII grossly intact, motor grossly intact throughout PSYCH:  Cognitively intact, oriented to person place and time   EKG:  EKG is ordered today. The ekg ordered today demonstrates sinus rhythm.  Rate 96 bpm.  R atrial enlargement.  L axis deviation.   LHC 03/05/16:  Normal coronary arteries. Coronary tortuosity is notable.  Normal left ventricular systolic function. Estimated ejection fraction is 65% LVEDP 14 mmHg and after V-gram by hand injection 20 mmHg  Echo 01/01/16: Study Conclusions  -  Left ventricle: Inferior and septal hypokinesis The cavity size  was mildly dilated. Wall thickness was normal. Systolic function  was mildly reduced. The estimated ejection fraction was in the  range of 45% to 50%. Doppler parameters are consistent with both  elevated ventricular end-diastolic filling pressure and elevated  left atrial filling pressure. - Aortic valve: Nodular calcification of non coronary cusp. 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 - Left atrium: The atrium was moderately dilated. - Atrial septum: No defect or patent foramen ovale was identified.   Recent Labs: 12/01/2015: ALT 12*; B Natriuretic Peptide 308.8* 02/28/2016: BUN 8; Creat 0.73; Hemoglobin 12.9; Platelets 339; Potassium 4.2; Sodium 139    Lipid Panel    Component Value Date/Time   CHOL 268* 02/10/2015 1507   TRIG 126 02/10/2015 1507   HDL 78 02/10/2015 1507   CHOLHDL 3.4 02/10/2015 1507   VLDL 25 02/10/2015 1507   LDLCALC 165* 02/10/2015 1507      Wt Readings from Last 3 Encounters:  03/29/16 84.823 kg (187 lb)  03/05/16 86.183 kg (190 lb)  02/23/16 87.998 kg (194 lb)      ASSESSMENT  AND PLAN:  # Chronic systolic heart failure: Linda Miller is euvolemic.  LVEF was 65% on LHC.   [place] a long time ago but not acutely  # Cough: # GERD:  Linda Miller accidentally continued lisinopril.  I asked her to stop taking it at this time.  I also think she may have GERD, which could be causing the cough.  We will try protonix 3m bid x 2 weeks then 40 mg daily.  # Hypertensive heart disease: Blood pressure is low today. She accidentally continue taking lisinopril and started losartan as well. We have instructed her to stop the lisinopril and continue losartan. She will also continue metoprolol. I've asked her not to take any additional antihypertensives today.  #  Hyperlipidemia: Continue atorvastatin.   Current medicines are reviewed at length with the patient today.  The patient does not have concerns regarding medicines.  The following changes have been made:  Stop lisinopril.  Start losartan 50 mg daily.   Labs/ tests ordered today include:  No orders of the defined types were placed in this encounter.    Disposition:   FU with Fransisca Shawn C. ROval Linsey [facility], [facility] 3 [facility]   This note was written with the assistance of speech recognition software.  Please excuse any transcriptional errors.  Signed, Tonika Eden C. ROval Linsey Miller, Linda Miller 03/29/2016 4:51 PM    [facility]

## 2016-04-19 ENCOUNTER — Encounter: Payer: Medicare Other | Admitting: Internal Medicine

## 2016-05-08 ENCOUNTER — Ambulatory Visit (INDEPENDENT_AMBULATORY_CARE_PROVIDER_SITE_OTHER): Payer: Medicare Other | Admitting: Pulmonary Disease

## 2016-05-08 ENCOUNTER — Encounter: Payer: Self-pay | Admitting: Pulmonary Disease

## 2016-05-08 VITALS — BP 128/68 | HR 92 | Temp 98.2°F | Ht 63.0 in | Wt 196.9 lb

## 2016-05-08 DIAGNOSIS — I130 Hypertensive heart and chronic kidney disease with heart failure and stage 1 through stage 4 chronic kidney disease, or unspecified chronic kidney disease: Secondary | ICD-10-CM | POA: Diagnosis not present

## 2016-05-08 DIAGNOSIS — Z794 Long term (current) use of insulin: Secondary | ICD-10-CM | POA: Diagnosis not present

## 2016-05-08 DIAGNOSIS — I10 Essential (primary) hypertension: Secondary | ICD-10-CM

## 2016-05-08 DIAGNOSIS — K0889 Other specified disorders of teeth and supporting structures: Secondary | ICD-10-CM | POA: Diagnosis not present

## 2016-05-08 DIAGNOSIS — N182 Chronic kidney disease, stage 2 (mild): Secondary | ICD-10-CM | POA: Diagnosis not present

## 2016-05-08 DIAGNOSIS — E1142 Type 2 diabetes mellitus with diabetic polyneuropathy: Secondary | ICD-10-CM | POA: Diagnosis not present

## 2016-05-08 DIAGNOSIS — K089 Disorder of teeth and supporting structures, unspecified: Secondary | ICD-10-CM

## 2016-05-08 DIAGNOSIS — I5042 Chronic combined systolic (congestive) and diastolic (congestive) heart failure: Secondary | ICD-10-CM

## 2016-05-08 DIAGNOSIS — K5900 Constipation, unspecified: Secondary | ICD-10-CM

## 2016-05-08 DIAGNOSIS — E1122 Type 2 diabetes mellitus with diabetic chronic kidney disease: Secondary | ICD-10-CM | POA: Diagnosis present

## 2016-05-08 DIAGNOSIS — G6289 Other specified polyneuropathies: Secondary | ICD-10-CM

## 2016-05-08 LAB — POCT GLYCOSYLATED HEMOGLOBIN (HGB A1C): Hemoglobin A1C: >14

## 2016-05-08 LAB — GLUCOSE, CAPILLARY: Glucose-Capillary: 386 mg/dL — ABNORMAL HIGH (ref 65–99)

## 2016-05-08 MED ORDER — PANTOPRAZOLE SODIUM 40 MG PO TBEC: DELAYED_RELEASE_TABLET | ORAL | Status: DC

## 2016-05-08 MED ORDER — METOPROLOL TARTRATE 25 MG PO TABS: 25.0000 mg | ORAL_TABLET | Freq: Two times a day (BID) | ORAL | Status: DC

## 2016-05-08 MED ORDER — INSULIN PEN NEEDLE 33G X 5 MM MISC: 1.0000 [IU] | Freq: Every day | Status: AC

## 2016-05-08 MED ORDER — AMITRIPTYLINE HCL 100 MG PO TABS: 100.0000 mg | ORAL_TABLET | Freq: Every day | ORAL | Status: DC

## 2016-05-08 MED ORDER — DOCUSATE SODIUM 100 MG PO CAPS: 100.0000 mg | ORAL_CAPSULE | Freq: Two times a day (BID) | ORAL | Status: DC

## 2016-05-08 MED ORDER — LIRAGLUTIDE 18 MG/3ML ~~LOC~~ SOPN: PEN_INJECTOR | SUBCUTANEOUS | Status: DC

## 2016-05-08 MED ORDER — DULOXETINE HCL 60 MG PO CPEP: 60.0000 mg | ORAL_CAPSULE | Freq: Every day | ORAL | Status: DC

## 2016-05-08 NOTE — Progress Notes (Addendum)
Subjective:    Patient ID: Linda Miller, female    DOB: Mar 14, 1956, 60 y.o.   MRN: [id number]  HPI Linda Miller is a 60 year old woman with chronic systolic and diastolic CHF, HTN, uncontrolled DM II , HLD here for follow up of DM II.  Toothache for past two weeks. They feel loose. Some chipped off pieces. No trismus. Some bleeding and bitter taste.   Constipation. Last BM this morning. Hard stools. Has to strain for past week.  Has pain in legs and feet with paresthesias. Similar to neuropathy pain in past. Amitriptyline helps the pain but not controlled.  Review of Systems Constitutional: no fevers/chills Eyes: no vision changes Ears, nose, mouth, throat, and face: +chronic cough with white thick sputum Respiratory: no shortness of breath Cardiovascular: +occasional chest pain Gastrointestinal: no nausea/vomiting, +occasional abdominal pain, +constipation, no diarrhea Genitourinary: no dysuria, no hematuria  Past Medical History  Diagnosis Date  . Diabetes mellitus   . Hypertension   . High cholesterol   . Osteomyelitis of right foot ([place])   . GERD (gastroesophageal reflux disease) 03/29/2016    Current Outpatient Prescriptions on File Prior to Visit  Medication Sig Dispense Refill  . ACCU-CHEK FASTCLIX LANCETS MISC Use to check blood sugar 3 to 4 times daily.Insulin requiring.  dx code E11.22 102 each 6  . acetaminophen-codeine (TYLENOL #3) 300-30 MG tablet TAKE 1 TABLET BY MOUTH EVERY 8 HOURS AS NEEDED FOR MODERATE PAIN 30 tablet 0  . albuterol (PROVENTIL HFA;VENTOLIN HFA) 108 (90 Base) MCG/ACT inhaler Inhale 2 puffs into the lungs every 6 (six) hours as needed for wheezing or shortness of breath. 1 Inhaler 1  . amitriptyline (ELAVIL) 50 MG tablet Take 2 tablets (100 mg total) by mouth at bedtime. 30 tablet 5  . aspirin 81 MG tablet Take 81 mg by mouth daily.    Marland Kitchen atorvastatin (LIPITOR) 40 MG tablet Take 1 tablet (40 mg total) by mouth daily. 30 tablet 5  . Blood  Glucose Monitoring Suppl (ACCU-CHEK NANO SMARTVIEW) W/DEVICE KIT 1 each by Does not apply route 2 (two) times daily. 1 kit 0  . fluconazole (DIFLUCAN) 150 MG tablet TAKE 1 TABLET (150 MG TOTAL) BY MOUTH AS NEEDED (TAKE 1 AS NEEDED FOR VAGINAL YEAST INFECTION). 1 tablet 4  . furosemide (LASIX) 40 MG tablet Take 1 tablet (40 mg total) by mouth daily. 30 tablet 5  . glucose blood (ACCU-CHEK SMARTVIEW) test strip 3 to 4  times daily before meals, diag code E11.22, insulin requiring 100 each 6  . insulin aspart protamine- aspart (NOVOLOG MIX 70/30) (70-30) 100 UNIT/ML injection Inject 0.3 mLs (30 Units total) into the skin 2 (two) times daily with a meal. 10 mL 11  . Insulin Syringe-Needle U-100 31G X 15/64" 0.3 ML MISC 1 each by Does not apply route 2 (two) times daily. Use to inject insulin twice daily dx code E11.22. Insulin dependent 100 each 6  . losartan (COZAAR) 50 MG tablet Take 1 tablet (50 mg total) by mouth daily. 30 tablet 5  . metFORMIN (GLUCOPHAGE) 1000 MG tablet Take 1 tablet (1,000 mg total) by mouth 2 (two) times daily. 180 tablet 2  . metoprolol tartrate (LOPRESSOR) 25 MG tablet Take 1 tablet (25 mg total) by mouth 2 (two) times daily. 60 tablet 5  . pantoprazole (PROTONIX) 40 MG tablet BY MOUTH TWICE A DAY FOR 2 WEEKS AND THEN ONCE A DAY 60 tablet 5   No current facility-administered medications on file prior to visit.  Objective:   Physical Exam Blood pressure 128/68, pulse 92, temperature 98.2 F (36.8 C), temperature source Oral, height '5\' 3"'$  (1.6 m), weight 196 lb 14.4 oz (89.313 kg), SpO2 100 %. General Apperance: NAD HEENT: Normocephalic, atraumatic, anicteric sclera Neck: Supple, trachea midline Lungs: Clear to auscultation bilaterally. No wheezes, rhonchi or rales. Breathing comfortably Heart: Regular rate and rhythm, no murmur/rub/gallop Abdomen: Soft, nontender, nondistended, no rebound/guarding Extremities: Warm and well perfused, no edema Skin: No rashes or  lesions Neurologic: Alert and interactive. No gross deficits.    Assessment & Plan:  Please refer to problem based charting.

## 2016-05-08 NOTE — Patient Instructions (Addendum)
Try capsaicin cream for your pain. You can buy this over the counter.  Start taking duloxetine daily for your pain as well.  I changed your amitriptyline tablet to the bigger one - just take 1 daily.  Start taking Victoza (liraglutide) - inject 0.6 mg once daily for 1 week; then increase to 1.2 mg once daily  Follow up in 3-4 weeks

## 2016-05-09 DIAGNOSIS — K5900 Constipation, unspecified: Secondary | ICD-10-CM | POA: Insufficient documentation

## 2016-05-09 NOTE — Assessment & Plan Note (Signed)
Refilled metoprolol 

## 2016-05-09 NOTE — Assessment & Plan Note (Signed)
Assessment: BP at goal today. 128/68. Was still taking lisinopril/HCTZ  Plan:  Discontinue lisinopril/HCTZ Continue losartan 50mg  daily, metoprolol 25mg  BID

## 2016-05-09 NOTE — Assessment & Plan Note (Signed)
Needs better control of diabetes Continue amitriptyline 100mg  daily Start duloxetine 60mg  daily Recommended to patient capsaicin cream OTC

## 2016-05-09 NOTE — Assessment & Plan Note (Signed)
Assessment: A1c >14. Had one episode of hypoglycemia to 70 in the afternoon  Plan: Continue metformin 1000mg  BID Novolog 70/30 30u BID Start liraglutide 0.6mg  daily for 1 week then increase to 1.2mg  daily. Follow up in 3-4 weeks. Titrate lirglutide to max

## 2016-05-09 NOTE — Assessment & Plan Note (Signed)
R upper and lower molars are cracked. No obvious purulent drainage.  Instructed patient to call back of Medicare card to find dentist.

## 2016-05-09 NOTE — Assessment & Plan Note (Signed)
Having hard stools but has BM every day  Docusate 100mg  BID Increase fiber, water

## 2016-05-11 NOTE — Progress Notes (Signed)
Internal Medicine Clinic Attending  Case discussed with Dr. Krall at the time of the visit.  We reviewed the resident's history and exam and pertinent patient test results.  I agree with the assessment, diagnosis, and plan of care documented in the resident's note.  

## 2016-05-15 ENCOUNTER — Encounter: Payer: Self-pay | Admitting: *Deleted

## 2016-05-16 ENCOUNTER — Other Ambulatory Visit: Payer: Self-pay | Admitting: Internal Medicine

## 2016-05-20 NOTE — Telephone Encounter (Signed)
Needs July / Aug PCP appt routine (Dm) F/U

## 2016-05-20 NOTE — Telephone Encounter (Signed)
Please schedule PCP appointment for Jul/Aug. For DM f/u per Dr. Rogelia BogaButcher. Thanks!

## 2016-06-12 ENCOUNTER — Encounter: Payer: Self-pay | Admitting: *Deleted

## 2016-07-02 ENCOUNTER — Ambulatory Visit: Payer: Medicare Other | Admitting: Cardiovascular Disease

## 2016-07-02 DIAGNOSIS — R0989 Other specified symptoms and signs involving the circulatory and respiratory systems: Secondary | ICD-10-CM

## 2016-07-02 NOTE — Progress Notes (Deleted)
Cardiology Office Note   Date:  07/02/2016   ID:  Linda Miller, Linda Miller 05-20-1956, MRN [id number]  PCP:  Alphonzo Grieve, MD  Cardiologist:   Skeet Latch, MD   No chief complaint on file.    History of Present Illness: Linda Miller is a 60 y.o. 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 She had an echo 01/01/16 that showed an ejection fraction of 45-50% with inferior and septal hypokinesis. She subsequently underwent left heart catheterization on 03/05/16 that showed normal coronary arteries that  were tortuous. Her ejection fraction on left ventriculography was 65% with an LVEDP of 14.  F/u BP and tobacco, CP  Linda Miller cut back to 1 cigarette daily.  She has also been working on her diet and lost 10 lb.   Past Medical History:  Diagnosis Date  . Diabetes mellitus   . GERD (gastroesophageal reflux disease) 03/29/2016  . High cholesterol   . Hypertension   . Osteomyelitis of right foot [facility])     Past Surgical History:  Procedure Laterality Date  . ABDOMINAL HYSTERECTOMY    . CARDIAC CATHETERIZATION N/A 03/05/2016   Procedure: Left Heart Cath and Coronary Angiography;  Surgeon: Belva Crome, MD;  Location: [place] [facility];  Service: Cardiovascular;  Laterality: N/A;  . DEBRIDEMENT  FOOT    . I&D EXTREMITY  09/23/2012   Procedure: IRRIGATION AND DEBRIDEMENT EXTREMITY;  Surgeon: Newt Minion, MD;  Location: [place];  Service: Orthopedics;  Laterality: Right;  . TEE WITHOUT CARDIOVERSION  09/24/2012   Procedure: TRANSESOPHAGEAL ECHOCARDIOGRAM (TEE);  Surgeon: Larey Dresser, MD;  Location: [facility];  Service: Cardiovascular;  Laterality: N/A;     Current Outpatient Prescriptions  Medication Sig Dispense Refill  . ACCU-CHEK FASTCLIX LANCETS MISC Use to check blood sugar 3 to 4 times daily.Insulin requiring.  dx code E11.22 102 each 6  . ACCU-CHEK SMARTVIEW  test strip USE 3 TIMES DAILY BEFORE MEALS 100 each 5  . albuterol (PROVENTIL HFA;VENTOLIN HFA) 108 (90 Base) MCG/ACT inhaler Inhale 2 puffs into the lungs every 6 (six) hours as needed for wheezing or shortness of breath. 1 Inhaler 1  . amitriptyline (ELAVIL) 100 MG tablet Take 1 tablet (100 mg total) by mouth at bedtime. 30 tablet 1  . aspirin 81 MG tablet Take 81 mg by mouth daily.    Marland Kitchen atorvastatin (LIPITOR) 40 MG tablet Take 1 tablet (40 mg total) by mouth daily. 30 tablet 5  . Blood Glucose Monitoring Suppl (ACCU-CHEK NANO SMARTVIEW) W/DEVICE KIT 1 each by Does not apply route 2 (two) times daily. 1 kit 0  . docusate sodium (COLACE) 100 MG capsule Take 1 capsule (100 mg total) by mouth 2 (two) times daily. 10 capsule 0  . DULoxetine (CYMBALTA) 60 MG capsule Take 1 capsule (60 mg total) by mouth daily. 30 capsule 3  . furosemide (LASIX) 40 MG tablet Take 1 tablet (40 mg total) by mouth daily. (Patient not taking: Reported on 05/08/2016) 30 tablet 5  . insulin aspart protamine- aspart (NOVOLOG MIX 70/30) (70-30) 100 UNIT/ML injection Inject 0.3 mLs (30 Units total) into the skin 2 (two) times daily with a meal. 10 mL 11  . Insulin Pen Needle 33G X 5 MM MISC 1 Units by Does not apply route daily. 100 each 1  . Insulin Syringe-Needle U-100 31G X 15/64" 0.3 ML MISC 1 each by Does not apply route 2 (  two) times daily. Use to inject insulin twice daily dx code E11.22. Insulin dependent 100 each 6  . Liraglutide 18 MG/3ML SOPN 0.6 mg once daily for 1 week; then increase to 1.2 mg once daily 6 mL 1  . losartan (COZAAR) 50 MG tablet Take 1 tablet (50 mg total) by mouth daily. 30 tablet 5  . metFORMIN (GLUCOPHAGE) 1000 MG tablet Take 1 tablet (1,000 mg total) by mouth 2 (two) times daily. 180 tablet 2  . metoprolol tartrate (LOPRESSOR) 25 MG tablet Take 1 tablet (25 mg total) by mouth 2 (two) times daily. 60 tablet 5  . pantoprazole (PROTONIX) 40 MG tablet BY MOUTH TWICE A DAY FOR 2 WEEKS AND THEN ONCE A DAY  60 tablet 5   No current facility-administered medications for this visit.     Allergies:   Coconut fatty acids; Codeine; Tramadol; Latex; and Lyrica [pregabalin]    Social History:  The patient  reports that she has been smoking Cigarettes.  She has been smoking about 0.20 packs per day. She quit smokeless tobacco use about 2 years ago. She reports that she drinks alcohol. She reports that she does not use drugs.   Family History:  The patient's family history includes CAD in her mother and sister; Cancer in her father; Diabetes in her mother; Stroke in her mother.    ROS:  Please see the history of present illness.   Otherwise, review of systems are positive for none.   All other systems are reviewed and negative.    PHYSICAL EXAM: VS:  There were no vitals taken for this visit. , BMI There is no height or weight on file to calculate BMI. GENERAL:  Well appearing HEENT:  Pupils equal round and reactive, fundi not visualized, oral mucosa unremarkable NECK:  No jugular venous distention, waveform within normal limits, carotid upstroke brisk and symmetric, no bruits, no thyromegaly LYMPHATICS:  No cervical adenopathy LUNGS: Expiratory wheezes and rhonchi bilaterally.  No crackles.   HEART:  RRR.  PMI not displaced or sustained,S1 and S2 within normal limits, no S3, no S4, no clicks, no rubs,  no murmurs ABD:  Flat, positive bowel sounds normal in frequency in pitch, no bruits, no rebound, no guarding, no midline pulsatile mass, no hepatomegaly, no splenomegaly EXT:  2 plus pulses throughout, no edema, no cyanosis no clubbing SKIN:  No rashes no nodules NEURO:  Cranial nerves II through XII grossly intact, motor grossly intact throughout PSYCH:  Cognitively intact, oriented to person place and time   EKG:  EKG is ordered today. The ekg ordered today demonstrates sinus rhythm.  Rate 96 bpm.  R atrial enlargement.  L axis deviation.   LHC 03/05/16:  Normal coronary arteries. Coronary  tortuosity is notable.  Normal left ventricular systolic function. Estimated ejection fraction is 65% LVEDP 14 mmHg and after V-gram by hand injection 20 mmHg  Echo 01/01/16: Study Conclusions  - Left ventricle: Inferior and septal hypokinesis The cavity size  was mildly dilated. Wall thickness was normal. Systolic function  was mildly reduced. The estimated ejection fraction was in the  range of 45% to 50%. Doppler parameters are consistent with both  elevated ventricular end-diastolic filling pressure and elevated  left atrial filling pressure. - Aortic valve: Nodular calcification of non coronary cusp. 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 - Left atrium: The atrium was moderately dilated. - Atrial septum: No defect or patent foramen ovale was identified.  Recent Labs: 12/01/2015: ALT 12 02/28/2016: Brain Natriuretic Peptide 118.4; BUN 8; Creat 0.73; Hemoglobin 12.9; Platelets 339; Potassium 4.2; Sodium 139    Lipid Panel    Component Value Date/Time   CHOL 268 (H) 02/10/2015 1507   TRIG 126 02/10/2015 1507   HDL 78 02/10/2015 1507   CHOLHDL 3.4 02/10/2015 1507   VLDL 25 02/10/2015 1507   LDLCALC 165 (H) 02/10/2015 1507      Wt Readings from Last 3 Encounters:  05/08/16 196 lb 14.4 oz (89.3 kg)  03/29/16 187 lb (84.8 kg)  03/05/16 190 lb (86.2 kg)      ASSESSMENT AND PLAN:  # Chronic systolic heart failure: Linda Miller is euvolemic.  LVEF was 65% on LHC.   [place] a long time ago but not acutely  # Cough: # GERD:  Linda Miller accidentally continued lisinopril.  I asked her to stop taking it at this time.  I also think she may have GERD, which could be causing the cough.  We will try protonix 31m bid x 2 weeks then 40 mg daily.  # Hypertensive heart disease: Blood pressure is low today. She accidentally continue taking lisinopril and started losartan as well. We have instructed her to stop the  lisinopril and continue losartan. She will also continue metoprolol. I've asked her not to take any additional antihypertensives today.  #  Hyperlipidemia: Continue atorvastatin.   Current medicines are reviewed at length with the patient today.  The patient does not have concerns regarding medicines.  The following changes have been made:  Stop lisinopril.  Start losartan 50 mg daily.   Labs/ tests ordered today include:  No orders of the defined types were placed in this encounter.   Disposition:   FU with Burkley Dech C. ROval Linsey MD, [facility]   This note was written with the assistance of speech recognition software.  Please excuse any transcriptional errors.  Signed, Keisa Blow C. ROval Linsey MD, [facility] 07/02/2016 7:53 AM    [place]

## 2016-07-03 ENCOUNTER — Encounter: Payer: Self-pay | Admitting: *Deleted

## 2016-07-03 NOTE — Progress Notes (Signed)
Letter mailed

## 2016-07-10 ENCOUNTER — Encounter: Payer: Self-pay | Admitting: *Deleted

## 2016-07-24 ENCOUNTER — Encounter: Payer: Medicare Other | Admitting: Internal Medicine

## 2016-07-25 ENCOUNTER — Encounter: Payer: Self-pay | Admitting: *Deleted

## 2016-07-31 ENCOUNTER — Encounter: Payer: Medicare Other | Admitting: Internal Medicine

## 2016-08-02 ENCOUNTER — Encounter: Payer: Medicare Other | Admitting: Internal Medicine

## 2016-08-22 ENCOUNTER — Encounter (HOSPITAL_COMMUNITY): Payer: Self-pay | Admitting: Emergency Medicine

## 2016-08-22 ENCOUNTER — Emergency Department (HOSPITAL_COMMUNITY)
Admission: EM | Admit: 2016-08-22 | Discharge: 2016-08-23 | Disposition: A | Discharge status: Home / Self Care | Payer: Medicare Other | Attending: Emergency Medicine | Admitting: Emergency Medicine

## 2016-08-22 DIAGNOSIS — Z7984 Long term (current) use of oral hypoglycemic drugs: Secondary | ICD-10-CM | POA: Diagnosis not present

## 2016-08-22 DIAGNOSIS — I10 Essential (primary) hypertension: Secondary | ICD-10-CM | POA: Insufficient documentation

## 2016-08-22 DIAGNOSIS — Z5321 Procedure and treatment not carried out due to patient leaving prior to being seen by health care provider: Secondary | ICD-10-CM | POA: Insufficient documentation

## 2016-08-22 DIAGNOSIS — Z794 Long term (current) use of insulin: Secondary | ICD-10-CM | POA: Diagnosis not present

## 2016-08-22 DIAGNOSIS — E119 Type 2 diabetes mellitus without complications: Secondary | ICD-10-CM | POA: Insufficient documentation

## 2016-08-22 DIAGNOSIS — L02611 Cutaneous abscess of right foot: Secondary | ICD-10-CM | POA: Diagnosis not present

## 2016-08-22 DIAGNOSIS — F17210 Nicotine dependence, cigarettes, uncomplicated: Secondary | ICD-10-CM | POA: Diagnosis not present

## 2016-08-22 LAB — CBC WITH DIFFERENTIAL/PLATELET
BASOS ABS: 0 10*3/uL (ref 0.0–0.1)
Basophils Relative: 0 %
EOS ABS: 0.2 10*3/uL (ref 0.0–0.7)
EOS PCT: 2 %
HCT: 38.7 % (ref 36.0–46.0)
HEMOGLOBIN: 12 g/dL (ref 12.0–15.0)
Lymphocytes Relative: 26 %
Lymphs Abs: 2.2 10*3/uL (ref 0.7–4.0)
MCH: 24.6 pg — ABNORMAL LOW (ref 26.0–34.0)
MCHC: 31 g/dL (ref 30.0–36.0)
MCV: 79.5 fL (ref 78.0–100.0)
Monocytes Absolute: 0.7 10*3/uL (ref 0.1–1.0)
Monocytes Relative: 8 %
NEUTROS PCT: 64 %
Neutro Abs: 5.4 10*3/uL (ref 1.7–7.7)
PLATELETS: 332 10*3/uL (ref 150–400)
RBC: 4.87 MIL/uL (ref 3.87–5.11)
RDW: 15.2 % (ref 11.5–15.5)
WBC: 8.6 10*3/uL (ref 4.0–10.5)

## 2016-08-22 LAB — COMPREHENSIVE METABOLIC PANEL
ALBUMIN: 3.4 g/dL — AB (ref 3.5–5.0)
ALT: 9 U/L — AB (ref 14–54)
AST: 9 U/L — AB (ref 15–41)
Alkaline Phosphatase: 66 U/L (ref 38–126)
Anion gap: 7 (ref 5–15)
BUN: 6 mg/dL (ref 6–20)
CALCIUM: 9.2 mg/dL (ref 8.9–10.3)
CHLORIDE: 104 mmol/L (ref 101–111)
CO2: 28 mmol/L (ref 22–32)
Creatinine, Ser: 0.86 mg/dL (ref 0.44–1.00)
GFR calc non Af Amer: >60 mL/min (ref >60)
GLUCOSE: 343 mg/dL — AB (ref 65–99)
Potassium: 4.3 mmol/L (ref 3.5–5.1)
SODIUM: 139 mmol/L (ref 135–145)
TOTAL PROTEIN: 7.3 g/dL (ref 6.5–8.1)
Total Bilirubin: 0.8 mg/dL (ref 0.3–1.2)

## 2016-08-22 LAB — CBG MONITORING, ED
GLUCOSE-CAPILLARY: 276 mg/dL — AB (ref 65–99)
Glucose-Capillary: 341 mg/dL — ABNORMAL HIGH (ref 65–99)

## 2016-08-22 NOTE — ED Notes (Signed)
Pt called for room X3 no answer nurse first aware

## 2016-08-22 NOTE — ED Notes (Signed)
cbg 341

## 2016-08-22 NOTE — ED Triage Notes (Signed)
Pt st's she is a diabetic and has had a abscess on bottom of right foot x's 4 months.  Pt st's now pain is going up her right leg.

## 2016-08-23 ENCOUNTER — Encounter: Payer: Medicare Other | Admitting: Internal Medicine

## 2016-08-29 ENCOUNTER — Ambulatory Visit (HOSPITAL_COMMUNITY)
Admission: RE | Admit: 2016-08-29 | Discharge: 2016-08-29 | Disposition: A | Discharge status: Home / Self Care | Payer: Medicare Other | Source: Ambulatory Visit | Attending: Oncology | Admitting: Oncology

## 2016-08-29 ENCOUNTER — Ambulatory Visit (INDEPENDENT_AMBULATORY_CARE_PROVIDER_SITE_OTHER): Payer: Medicare Other | Admitting: Internal Medicine

## 2016-08-29 VITALS — BP 135/67 | HR 82 | Temp 98.9°F | Wt 190.8 lb

## 2016-08-29 DIAGNOSIS — M79672 Pain in left foot: Secondary | ICD-10-CM | POA: Insufficient documentation

## 2016-08-29 DIAGNOSIS — Z23 Encounter for immunization: Secondary | ICD-10-CM | POA: Diagnosis present

## 2016-08-29 DIAGNOSIS — F17210 Nicotine dependence, cigarettes, uncomplicated: Secondary | ICD-10-CM | POA: Diagnosis not present

## 2016-08-29 DIAGNOSIS — M7732 Calcaneal spur, left foot: Secondary | ICD-10-CM | POA: Diagnosis not present

## 2016-08-29 DIAGNOSIS — E119 Type 2 diabetes mellitus without complications: Secondary | ICD-10-CM | POA: Diagnosis not present

## 2016-08-29 DIAGNOSIS — Z794 Long term (current) use of insulin: Secondary | ICD-10-CM | POA: Diagnosis not present

## 2016-08-29 DIAGNOSIS — Z0000 Encounter for general adult medical examination without abnormal findings: Secondary | ICD-10-CM

## 2016-08-29 NOTE — Patient Instructions (Addendum)
[facility] Please make sure you see them soon.   I also want to remind you of your future appointment with Dr. Dimple Caseyice on 10/11/16 at 1:15 pm.

## 2016-08-30 DIAGNOSIS — M79672 Pain in left foot: Secondary | ICD-10-CM | POA: Insufficient documentation

## 2016-08-30 NOTE — Assessment & Plan Note (Signed)
HPI Pt reports hx of L foot pain for the past 4 months. Denies any injury or puncture wound. States she noticed a small hard area under the foot initially which has been slowly growing in size. States it drained some pus one month ago. Denies having any fevers, chills, nausea, or vomiting. She is taking OTC NSAIDs and analgesics to control the pain.   A Patient's L foot pain was concerning for possible abscess or osteomyelitis considering history of uncontrolled diabetes (A1c >14 three months ago). Review of chart reveals she saw Dr. Lajoyce Cornersuda in 2013 for abscess of R heel and underwent partial calcaneal excision at that time. However, foot x-ray ordered at this visit did not show any soft tissue swelling or periosteal reaction. X-ray did reveal a small plantar calcaneal enthesophyte.  P -Advised stretching exercises  -Wound care consult because patient is high risk for a diabetic complication -Advised to return in 1-2 weeks with meter for diabetes mgmt

## 2016-08-30 NOTE — Assessment & Plan Note (Signed)
Influenza vaccine administered at this visit. 

## 2016-08-30 NOTE — Progress Notes (Signed)
   CC: Pt is here to discuss pain in her L foot.  HPI:  [name] is aMickle Miller 60 y.o. F with a PMHx of conditions listed below presenting to the clinic c/o pain in her L foot. Please see problem based charting for the status of the patient's current and chronic medical conditions.   Past Medical History:  Diagnosis Date  . Diabetes mellitus   . GERD (gastroesophageal reflux disease) 03/29/2016  . High cholesterol   . Hypertension   . Osteomyelitis of right foot (HCC)     Review of Systems:  Pertinent positives mentioned in HPI. Remainder of all ROS negative.   Physical Exam:  Vitals:   08/29/16 1334 08/29/16 1539  BP: 92/78 135/67  Pulse: 92 82  Temp: 98.9 F (37.2 C)   TempSrc: Oral   SpO2: 100%   Weight: 190 lb 12.8 oz (86.5 kg)    Physical Exam  Constitutional: She is oriented to person, place, and time. She appears well-developed and well-nourished. No distress.  HENT:  Head: Normocephalic and atraumatic.  Mouth/Throat: Oropharynx is clear and moist.  Eyes: EOM are normal.  Neck: Neck supple. No tracheal deviation present.  Cardiovascular: Normal rate, regular rhythm and intact distal pulses.   Pulmonary/Chest: Effort normal and breath sounds normal. No respiratory distress.  Abdominal: Soft. Bowel sounds are normal. She exhibits no distension. There is no tenderness.  Neurological: She is alert and oriented to person, place, and time.  Skin: Skin is warm and dry.  2 x 2 cm area of induration noted on the plantar aspect of the left foot. No erythema, increased warmth, or drainage.     Assessment & Plan:   See Encounters Tab for problem based charting.  Patient discussed with Dr. Cleda DaubE. Hoffman

## 2016-09-03 NOTE — Addendum Note (Signed)
Addended by: Neomia DearPOWERS, Demetrica Zipp E on: 09/03/2016 08:42 AM   Modules accepted: Orders

## 2016-09-03 NOTE — Progress Notes (Signed)
Internal Medicine Clinic Attending  Case discussed with Dr. Loney Lohathore at the time of the visit.  We reviewed the resident's history and exam and pertinent patient test results.  I agree with the assessment, diagnosis, and plan of care documented in the resident's note. She is very high risk for complications given her uncontrolled DM and pervious amputation, agree with referral to wound center.

## 2016-10-11 ENCOUNTER — Encounter: Payer: Medicare Other | Admitting: Internal Medicine

## 2016-10-25 ENCOUNTER — Other Ambulatory Visit: Payer: Self-pay | Admitting: Internal Medicine

## 2016-10-25 ENCOUNTER — Other Ambulatory Visit: Payer: Self-pay | Admitting: Pulmonary Disease

## 2016-10-25 DIAGNOSIS — G6289 Other specified polyneuropathies: Secondary | ICD-10-CM

## 2016-10-28 NOTE — Telephone Encounter (Signed)
appt 12/22 with pcp

## 2016-10-31 ENCOUNTER — Telehealth: Payer: Self-pay

## 2016-10-31 ENCOUNTER — Other Ambulatory Visit: Payer: Self-pay | Admitting: *Deleted

## 2016-10-31 NOTE — Telephone Encounter (Signed)
Pt requesting refill on Tylenol# 3 - not on current med list. Pt states she needs it for legs/feet pain. Last filled 05/13/16 qty 30 at [facility]. Also asked about Novolog and Diflucan - informed her the pharmacy have rxs and will have them ready for her.

## 2016-10-31 NOTE — Telephone Encounter (Signed)
Please call pt back regarding meds.  

## 2016-10-31 NOTE — Telephone Encounter (Signed)
error 

## 2016-11-04 ENCOUNTER — Other Ambulatory Visit: Payer: Self-pay | Admitting: *Deleted

## 2016-11-04 MED ORDER — LOSARTAN POTASSIUM 50 MG PO TABS: 50.0000 mg | ORAL_TABLET | Freq: Every day | ORAL | 2 refills | Status: DC

## 2016-11-14 ENCOUNTER — Telehealth: Payer: Self-pay | Admitting: Internal Medicine

## 2016-11-14 NOTE — Telephone Encounter (Signed)
APT. REMINDER CALL, NO ANSWER, NO VOICEMAIL °

## 2016-11-15 ENCOUNTER — Encounter: Payer: Medicare Other | Admitting: Internal Medicine

## 2016-11-20 NOTE — Telephone Encounter (Signed)
I think restarting Tylenol #3 needs a clinic visit to discuss if it is appropriate. I see she has an appointment 1/26 with me upcoming. If she wishes to reschedule to earlier in the month [facility] so that is an option if she needs some treatment sooner than a month from now.

## 2016-11-21 ENCOUNTER — Other Ambulatory Visit: Payer: Self-pay

## 2016-11-21 NOTE — Telephone Encounter (Signed)
Last visit: 08/29/2016 Last UDS: 09/17/2013 Next appointment: 12/20/2016 Last refill:05/22/2016

## 2016-11-22 NOTE — Telephone Encounter (Signed)
I think restarting Tylenol #3 needs a clinic visit to discuss if it is appropriate since I've never discussed the problem with her before. I see she has an appointment 1/26 with me upcoming. If she wishes to reschedule to earlier in the month [facility] so that is an option if she wants to change treatment sooner than a month from now.

## 2016-11-26 ENCOUNTER — Telehealth: Payer: Self-pay

## 2016-11-26 NOTE — Telephone Encounter (Signed)
Attempted to call patient to see if she wanted an earlier appointment no answer no voice amil

## 2016-11-28 ENCOUNTER — Other Ambulatory Visit: Payer: Self-pay | Admitting: Pulmonary Disease

## 2016-11-28 ENCOUNTER — Other Ambulatory Visit: Payer: Self-pay | Admitting: Internal Medicine

## 2016-11-28 DIAGNOSIS — G6289 Other specified polyneuropathies: Secondary | ICD-10-CM

## 2016-12-20 ENCOUNTER — Ambulatory Visit (INDEPENDENT_AMBULATORY_CARE_PROVIDER_SITE_OTHER): Payer: Medicare Other | Admitting: Internal Medicine

## 2016-12-20 ENCOUNTER — Encounter: Payer: Self-pay | Admitting: Internal Medicine

## 2016-12-20 ENCOUNTER — Ambulatory Visit: Payer: Medicare Other | Admitting: Pharmacist

## 2016-12-20 VITALS — BP 105/78 | HR 92 | Temp 98.0°F | Ht 63.0 in | Wt 191.3 lb

## 2016-12-20 DIAGNOSIS — E1165 Type 2 diabetes mellitus with hyperglycemia: Secondary | ICD-10-CM | POA: Diagnosis not present

## 2016-12-20 DIAGNOSIS — N182 Chronic kidney disease, stage 2 (mild): Secondary | ICD-10-CM

## 2016-12-20 DIAGNOSIS — Z794 Long term (current) use of insulin: Secondary | ICD-10-CM

## 2016-12-20 DIAGNOSIS — E1122 Type 2 diabetes mellitus with diabetic chronic kidney disease: Secondary | ICD-10-CM | POA: Diagnosis not present

## 2016-12-20 DIAGNOSIS — B372 Candidiasis of skin and nail: Secondary | ICD-10-CM

## 2016-12-20 DIAGNOSIS — F17210 Nicotine dependence, cigarettes, uncomplicated: Secondary | ICD-10-CM | POA: Diagnosis not present

## 2016-12-20 DIAGNOSIS — J069 Acute upper respiratory infection, unspecified: Secondary | ICD-10-CM | POA: Diagnosis not present

## 2016-12-20 DIAGNOSIS — Z7982 Long term (current) use of aspirin: Secondary | ICD-10-CM | POA: Diagnosis not present

## 2016-12-20 DIAGNOSIS — L739 Follicular disorder, unspecified: Secondary | ICD-10-CM | POA: Insufficient documentation

## 2016-12-20 DIAGNOSIS — I951 Orthostatic hypotension: Secondary | ICD-10-CM

## 2016-12-20 LAB — POCT GLYCOSYLATED HEMOGLOBIN (HGB A1C): Hemoglobin A1C: >14

## 2016-12-20 LAB — GLUCOSE, CAPILLARY: Glucose-Capillary: 561 mg/dL (ref 65–99)

## 2016-12-20 MED ORDER — METFORMIN HCL 1000 MG PO TABS: 1000.0000 mg | ORAL_TABLET | Freq: Two times a day (BID) | ORAL | 0 refills | Status: DC

## 2016-12-20 MED ORDER — DOXYCYCLINE HYCLATE 100 MG PO TABS: 100.0000 mg | ORAL_TABLET | Freq: Two times a day (BID) | ORAL | 0 refills | Status: DC

## 2016-12-20 MED FILL — DOXYCYCLINE HYCLATE 100 MG: 100 | 5 days supply | Qty: 10 | Fill #0

## 2016-12-20 MED FILL — metFORMIN HCL 1000 MG TABS: 1000 | 30 days supply | Qty: 60 | Fill #0

## 2016-12-20 NOTE — Patient Instructions (Addendum)
It was a pleasure to see you today Linda Miller. I am sorry to hear you are feeling so poorly.  It is very important we get your blood sugars back under control. Please resume taking your medications as previously prescribed.  I have also prescribed an antibiotic to take twice a day for 5 days. This should treat your airway infection and the skin infections you have all over.  It is very important you return to clinic early next week. If you start to feel much sicker before then please call us or go to the ED over the weekend.

## 2016-12-20 NOTE — Progress Notes (Signed)
   CC: Hyperglycemia, body aches  HPI:  Linda Miller is a 61 y.o. woman here today with multiple active problems including diffuse body aches, skin lesions, and being out of her medications including insulin since the start of the month.  See problem based assessment and plan below for additional details  Past Medical History:  Diagnosis Date  . Diabetes mellitus   . GERD (gastroesophageal reflux disease) 03/29/2016  . High cholesterol   . Hypertension   . Osteomyelitis of right foot (HCC)     Review of Systems:  Review of Systems  Constitutional: Negative for fever.  HENT: Positive for sore throat.   Eyes: Positive for blurred vision.  Respiratory: Positive for cough and sputum production.   Cardiovascular: Negative for chest pain and leg swelling.  Gastrointestinal: Positive for abdominal pain. Negative for blood in stool, diarrhea, nausea and vomiting.  Genitourinary: Positive for frequency and urgency.  Musculoskeletal: Positive for back pain, joint pain and myalgias.  Skin: Positive for itching and rash.  Endo/Heme/Allergies: Positive for polydipsia.    Physical Exam: Physical Exam  Constitutional:  Chronically ill appearing woman, in wheelchair, in no acute distress  HENT:  Mouth/Throat: Oropharynx is clear and moist. No oropharyngeal exudate.  TMs clear bilaterally  Eyes: Conjunctivae are normal.  Cardiovascular: Normal rate and regular rhythm.   Pulmonary/Chest: Effort normal and breath sounds normal. She has no wheezes.  Abdominal: Soft. There is no tenderness.  Musculoskeletal: She exhibits no edema.  Lymphadenopathy:    She has no cervical adenopathy.  Skin:  Several furuncles all over including right axilla, abdomen, right and left proximal thighs Intertriginous rash is present under both breasts    Vitals:   12/20/16 1445  BP: 105/78  Pulse: 92  Temp: 98 F (36.7 C)  TempSrc: Oral  SpO2: 100%  Weight: 191 lb 4.8 oz (86.8 kg)  Height: 5\' 3"   (1.6 m)    Assessment & Plan:   See Encounters Tab for problem based charting.  Patient discussed with Dr. Heide SparkNarendra

## 2016-12-22 DIAGNOSIS — B372 Candidiasis of skin and nail: Secondary | ICD-10-CM | POA: Insufficient documentation

## 2016-12-22 NOTE — Assessment & Plan Note (Signed)
She also has a mildly pruritic rash starting under her breasts and under her abdominal pannus today.  The symptoms and skin changes are very mild at this point. This is also related to her diabetes. I would like to get her glucose somewhat controlled right away.  If the rash is remaining a problem in a few days antifungal treatment with oral medication given the scattered distribution.

## 2016-12-22 NOTE — Assessment & Plan Note (Signed)
HPI: She has been off any treatment for her diabetes since the first week of January due to running out of medications. She states her medicare is being changed to a different [employer] policy but not in effect for a few weeks and she cannot afford medications without this. She has very uncontrolled diabetes at baseline with recent Hgb A1c at 14. She is obviously symptomatic today with fatigue, polydipsia, and urinary frequency. She has intertriginous rashes and folliculitis at many sites also likely due to her wildly uncontrolled hyperglycemia. CBG checked today is 541.  A: Symptomatic hyperglycemia from uncontrolled type 2 diabetes She is not in true distress and is tolerating drinking large amounts of water while in clinic which is reassuring. No mental status changes besides being very uncomfortable.  P: -Clinic pharmacist is able to provide lantus insulin and liraglutide from stock today -Order for metformin placed to outpatient pharmacy under IM Program for hopefully free access -RTC in 3-4 days

## 2016-12-22 NOTE — Assessment & Plan Note (Signed)
HPI: Over the past few weeks she has noticed increasing amounts of small dark skin bumps breaking out particularly in skin fold areas. These are very mildly tender and some have unroofed draining purulent fluid.  A: Diffuse folliculitis worsening with persistent elevated CBGs  P: -Doxycycline 100mg  BID x5 days sent to outpatient pharmacy

## 2016-12-23 NOTE — Progress Notes (Signed)
Internal Medicine Clinic Attending  Case discussed with Dr. Rice at the time of the visit.  We reviewed the resident's history and exam and pertinent patient test results.  I agree with the assessment, diagnosis, and plan of care documented in the resident's note.  

## 2016-12-23 NOTE — Progress Notes (Signed)
S: Linda Miller is a 61 y.o. female reports with hyperglycemia to physician-pharmacist co-visit. Patient did not bring medication bottles.   Allergies  Allergen Reactions  . Coconut Fatty Acids Itching and Swelling  . Codeine Itching and Swelling    Throat  . Lisinopril Cough  . Tramadol Hives and Itching  . Latex Itching and Rash  . Lyrica [Pregabalin] Itching and Rash   Medication Sig  ACCU-CHEK FASTCLIX LANCETS MISC Use to check blood sugar 3 to 4 times daily.Insulin requiring.  dx code E11.22  ACCU-CHEK SMARTVIEW test strip USE 3 TIMES DAILY BEFORE MEALS  albuterol (PROVENTIL HFA;VENTOLIN HFA) 108 (90 Base) MCG/ACT inhaler Inhale 2 puffs into the lungs every 6 (six) hours as needed for wheezing or shortness of breath.  amitriptyline (ELAVIL) 100 MG tablet TAKE ONE TABLET BY MOUTH EVERY NIGHT AT BEDTIME  aspirin 81 MG tablet Take 81 mg by mouth daily.  atorvastatin (LIPITOR) 40 MG tablet Take 1 tablet (40 mg total) by mouth daily.  Blood Glucose Monitoring Suppl (ACCU-CHEK NANO SMARTVIEW) W/DEVICE KIT 1 each by Does not apply route 2 (two) times daily.  docusate sodium (COLACE) 100 MG capsule Take 1 capsule (100 mg total) by mouth 2 (two) times daily.  doxycycline (VIBRA-TABS) 100 MG tablet Take 1 tablet (100 mg total) by mouth 2 (two) times daily. IM Program [place]  DULoxetine (CYMBALTA) 60 MG capsule TAKE ONE CAPSULE BY MOUTH DAILY  fluconazole (DIFLUCAN) 150 MG tablet TAKE 1 TABLET BY MOUTH AS NEEDED FOR VAGINAL YEAST INFECTION.  furosemide (LASIX) 40 MG tablet Take 1 tablet (40 mg total) by mouth daily. Patient not taking: Reported on 05/08/2016  insulin aspart protamine- aspart (NOVOLOG MIX 70/30) (70-30) 100 UNIT/ML injection Inject 0.3 mLs (30 Units total) into the skin 2 (two) times daily with a meal.  Insulin Pen Needle 33G X 5 MM MISC 1 Units by Does not apply route daily.  Insulin Syringe-Needle U-100 31G X 15/64" 0.3 ML MISC 1 each by Does not apply route 2 (two)  times daily. Use to inject insulin twice daily dx code E11.22. Insulin dependent  Liraglutide 18 MG/3ML SOPN 0.6 mg once daily for 1 week; then increase to 1.2 mg once daily  losartan (COZAAR) 50 MG tablet Take 1 tablet (50 mg total) by mouth daily.  metFORMIN (GLUCOPHAGE) 1000 MG tablet Take 1 tablet (1,000 mg total) by mouth 2 (two) times daily with a meal.  metoprolol tartrate (LOPRESSOR) 25 MG tablet Take 1 tablet (25 mg total) by mouth 2 (two) times daily.  pantoprazole (PROTONIX) 40 MG tablet BY MOUTH TWICE A DAY FOR 2 WEEKS AND THEN ONCE A DAY   Past Medical History:  Diagnosis Date  . Diabetes mellitus   . GERD (gastroesophageal reflux disease) 03/29/2016  . High cholesterol   . Hypertension   . Osteomyelitis of right foot [facility])    Social History   Social History  . Marital status: Legally Separated    Spouse name: N/A  . Number of children: N/A  . Years of education: 25   Social History Main Topics  . Smoking status: Current Some Day Smoker    Packs/day: 0.20    Types: Cigarettes    Last attempt to quit: 11/07/2013  . Smokeless tobacco: Former Systems developer    Quit date: 06/25/2014     Comment: plans to quit by Christmas, down to 1 cigarette/week  . Alcohol use 0.0 oz/week  . Drug use: No  . Sexual activity: Not on file  Other Topics Concern  . Not on file   Social History Narrative  . No narrative on file   Family History  Problem Relation Age of Onset  . Diabetes Mother   . Stroke Mother   . CAD Mother   . Cancer Father   . CAD Sister    O: Component Value Date/Time   CHOL 268 (H) 02/10/2015 1507   HDL 78 02/10/2015 1507   TRIG 126 02/10/2015 1507   AST 9 (L) 08/22/2016 1548   ALT 9 (L) 08/22/2016 1548   NA 139 08/22/2016 1548   NA 131 (L) 01/31/2016 1525   K 4.3 08/22/2016 1548   CL 104 08/22/2016 1548   CO2 28 08/22/2016 1548   GLUCOSE 343 (H) 08/22/2016 1548   HGBA1C >14.0 12/20/2016 1449   HGBA1C 15.6 (H) 09/21/2012 1750   BUN 6 08/22/2016 1548    BUN 16 01/31/2016 1525   CREATININE 0.86 08/22/2016 1548   CREATININE 0.73 02/28/2016 1347   CALCIUM 9.2 08/22/2016 1548   GFRAA >60 08/22/2016 1548   GFRAA 75 02/10/2015 1507   WBC 8.6 08/22/2016 1548   HGB 12.0 08/22/2016 1548   HCT 38.7 08/22/2016 1548   PLT 332 08/22/2016 1548   Ht Readings from Last 2 Encounters:  12/20/16 _0  (1.6 m)  08/22/16 _1  (1.6 m)   Wt Readings from Last 2 Encounters:  12/20/16 191 lb 4.8 oz (86.8 kg)  08/29/16 190 lb 12.8 oz (86.5 kg)   There is no height or weight on file to calculate BMI. BP Readings from Last 3 Encounters:  12/20/16 105/78  08/29/16 135/67  08/22/16 138/86   A/P:  Patient states she has not started liraglutide and has not been taking insulin due to being out. Medication Samples have been provided to the patient.  Drug: liraglutide (Victoza) Strength: 18 mcg Qty: 1 LOT: [id number] Exp.Date: Jan-19  Dosing instructions: 0.6 mg daily x 1 week, then increase to 1.2 mg daily  Drug: insulin aspart mix 70/30 Strength: 100 units/mL Qty: 1 LOT: [id number] Exp.Date: Oct-18  Dosing instructions: 30 units BID  The patient has been instructed regarding the correct time, dose, and frequency of taking this medication, including desired effects and most common side effects.   An after visit summary was provided and patient advised to follow up if any changes in condition or questions regarding medications arise.   The patient verbalized understanding of information provided by repeating back concepts discussed.  Linda Miller 9:04 AM 12/23/2016

## 2016-12-24 ENCOUNTER — Ambulatory Visit (INDEPENDENT_AMBULATORY_CARE_PROVIDER_SITE_OTHER): Payer: Medicare Other | Admitting: Internal Medicine

## 2016-12-24 VITALS — BP 128/68 | HR 94 | Temp 97.9°F | Ht 63.0 in | Wt 187.5 lb

## 2016-12-24 DIAGNOSIS — E860 Dehydration: Secondary | ICD-10-CM

## 2016-12-24 DIAGNOSIS — F17210 Nicotine dependence, cigarettes, uncomplicated: Secondary | ICD-10-CM

## 2016-12-24 DIAGNOSIS — E1165 Type 2 diabetes mellitus with hyperglycemia: Secondary | ICD-10-CM

## 2016-12-24 DIAGNOSIS — R05 Cough: Secondary | ICD-10-CM | POA: Diagnosis not present

## 2016-12-24 DIAGNOSIS — R21 Rash and other nonspecific skin eruption: Secondary | ICD-10-CM

## 2016-12-24 DIAGNOSIS — N179 Acute kidney failure, unspecified: Secondary | ICD-10-CM | POA: Diagnosis not present

## 2016-12-24 DIAGNOSIS — E1122 Type 2 diabetes mellitus with diabetic chronic kidney disease: Secondary | ICD-10-CM | POA: Diagnosis not present

## 2016-12-24 DIAGNOSIS — I951 Orthostatic hypotension: Secondary | ICD-10-CM | POA: Diagnosis not present

## 2016-12-24 DIAGNOSIS — Z794 Long term (current) use of insulin: Secondary | ICD-10-CM | POA: Diagnosis not present

## 2016-12-24 DIAGNOSIS — R5383 Other fatigue: Secondary | ICD-10-CM

## 2016-12-24 DIAGNOSIS — R059 Cough, unspecified: Secondary | ICD-10-CM | POA: Insufficient documentation

## 2016-12-24 DIAGNOSIS — M791 Myalgia: Secondary | ICD-10-CM | POA: Diagnosis not present

## 2016-12-24 DIAGNOSIS — N182 Chronic kidney disease, stage 2 (mild): Secondary | ICD-10-CM

## 2016-12-24 DIAGNOSIS — J3489 Other specified disorders of nose and nasal sinuses: Secondary | ICD-10-CM

## 2016-12-24 DIAGNOSIS — L739 Follicular disorder, unspecified: Secondary | ICD-10-CM | POA: Diagnosis not present

## 2016-12-24 DIAGNOSIS — Z7982 Long term (current) use of aspirin: Secondary | ICD-10-CM

## 2016-12-24 LAB — GLUCOSE, CAPILLARY
GLUCOSE-CAPILLARY: 233 mg/dL — AB (ref 65–99)
Glucose-Capillary: 595 mg/dL (ref 65–99)
Glucose-Capillary: 508 mg/dL (ref 65–99)

## 2016-12-24 MED ORDER — INSULIN ASPART 100 UNIT/ML ~~LOC~~ SOLN
15.0000 [IU] | Freq: Once | SUBCUTANEOUS | Status: AC
  Administered 2016-12-24: 15 [IU] via SUBCUTANEOUS

## 2016-12-24 MED ORDER — SODIUM CHLORIDE 0.9 % IV BOLUS (SEPSIS)
1000.0000 mL | Freq: Once | INTRAVENOUS | Status: AC
  Administered 2016-12-24: 1000 mL via INTRAVENOUS

## 2016-12-24 NOTE — Progress Notes (Signed)
   CC: Follow up for hyperglycemia  HPI:  [name] is a 61 y.o. woman with uncontrolled type 2 diabetes who is here four days after previous visit for symptomatic hyperglycemia.  See problem based assessment and plan below for additional details  Past Medical History:  Diagnosis Date  . Diabetes mellitus   . GERD (gastroesophageal reflux disease) 03/29/2016  . High cholesterol   . Hypertension   . Osteomyelitis of right foot (HCC)     Review of Systems:  Review of Systems  Constitutional: Negative for fever.  HENT: Positive for congestion.   Eyes: Negative for blurred vision.  Respiratory: Positive for cough. Negative for shortness of breath.   Cardiovascular: Negative for chest pain and leg swelling.  Gastrointestinal: Negative for abdominal pain.  Genitourinary: Positive for frequency.  Skin: Positive for rash.  Endo/Heme/Allergies: Positive for polydipsia.    Physical Exam: Physical Exam  Constitutional:  Chronically ill appearing woman in wheelchair  HENT:  Mouth/Throat: No oropharyngeal exudate.  Eyes: Conjunctivae are normal.  Neck: No JVD present.  Cardiovascular: Normal rate and regular rhythm.   Pulmonary/Chest: Effort normal and breath sounds normal.  Musculoskeletal: She exhibits no edema.  Skin:  Nearly flat hyperpigmented lesions present in groin and right arm, without surrounding erythema Small amount of erythema in inframammary folds without tenderness, no satellite lesions    Vitals:   12/24/16 1502  BP: 98/80  Pulse: 92  Temp: 97.9 F (36.6 C)  TempSrc: Oral  SpO2: 96%  Weight: 187 lb 8 oz (85 kg)  Height: 5\' 3"  (1.6 m)    Assessment & Plan:   See Encounters Tab for problem based charting.  Patient seen with Dr. Heide SparkNarendra

## 2016-12-24 NOTE — Assessment & Plan Note (Signed)
HPI: The erythema is actually much improved today despite no specific treatment for a candidal process. I am not sure if this decrease in inflammation is from doxycycline.  A: It is possible this rash was simply from skin irritation without a true fungal infection although my suspicion is high given hr uncontrolled diabetes.  P: No new treatments for now

## 2016-12-24 NOTE — Patient Instructions (Addendum)
It was a pleasure to see you today Linda Miller.  I think your sugars are still so high due to not taking your insulin. You will need to take your insulin 30 units twice daily in addition to your metformin and Victoza. You can use syringes for dosing this insulin if you are familiar with that way.  We really need to check on you again soon. I would like you to return to clinic on Friday afternoon and see me again. I will have the front desk staff arrange an appointment and contact you.  If this current sugar problem is getting better we can get you plugged in with our pharmacy on getting a 90 day supply of medications. I think a treatment for yeast rash is also going to be beneficial at that time if things are proceeding.

## 2016-12-24 NOTE — Assessment & Plan Note (Addendum)
HPI: These raised lesions are significantly improved mostly flattened or gone. Right axillary and groin follicles remain but without any surrounding erythema.  A: Folliculitis responding to treatment with doxycycline  P: -Finish doxycycline course through 1/31 -Continued treatment of her hyperglycemia should restore better immunocompetence

## 2016-12-24 NOTE — Assessment & Plan Note (Deleted)
HPI: Her fatigue, increased thirst and urinary output continue since Friday. She has resumed taking her metformin and liraglutide but seemed to have confusion about her insulin and did not take any since Friday. She was previously taking 30 units twice daily as a baseline regimen. CBG today is 595 and she is more hypotensive at 98/80. She has not had any problems of lightheadedness, weakness, or shortness of breath associated with this.  A: Symptomatic hypoglycemia due to inadequate insulin therapy She is hypotensive from the associated osmotic diuresis but is hemodynamically stable  P: -STAT BMP in clinic today demonstrates a mild AKI but there is no significant acidosis or anion gap present -15 units insulin aspart and 1 liter of intravenous 0.9% NaCl administered with improvement to normal blood pressure and CBG to 233 -Very specific counseling given today about using her insulin therapy at home 30 units twice daily -She will need very close follow up ideally with my clinic on Friday 11/26/16

## 2016-12-24 NOTE — Assessment & Plan Note (Signed)
HPI: She has a cough with upper airway congestion and body aches since last week. This seems like a viral URI or flu-like illness that is superimposed on her dehydration and fatigue from hyperglycemia. Her symptoms are actually improving since the visit 4 days ago now with a most nonproductive, infrequent cough that is not painful. There is no oropharyngeal exudate, lymphadenopathy, or abnormal lung examination to suggest a purulent or complicated infection.  A: Improving acute URI with nonproductive cough  P: -IV fluids for her dehydration as above -I do not think any additional treatment is needed as the problem is resolving

## 2016-12-25 LAB — BASIC METABOLIC PANEL
Anion gap: 11 (ref 5–15)
BUN: 16 mg/dL (ref 6–20)
CHLORIDE: 97 mmol/L — AB (ref 101–111)
CO2: 23 mmol/L (ref 22–32)
Calcium: 9.1 mg/dL (ref 8.9–10.3)
Creatinine, Ser: 1.18 mg/dL — ABNORMAL HIGH (ref 0.44–1.00)
GFR calc Af Amer: 57 mL/min — ABNORMAL LOW (ref >60)
GFR calc non Af Amer: 49 mL/min — ABNORMAL LOW (ref >60)
GLUCOSE: 516 mg/dL — AB (ref 65–99)
Potassium: 3.5 mmol/L (ref 3.5–5.1)
Sodium: 131 mmol/L — ABNORMAL LOW (ref 135–145)

## 2016-12-25 NOTE — Assessment & Plan Note (Signed)
HPI: Her fatigue, increased thirst and urinary output continue since Friday. She has resumed taking her metformin and liraglutide but seemed to have confusion about her insulin and did not take any since Friday. She was previously taking 30 units twice daily as a baseline regimen. CBG today is 595 and she is more hypotensive at 98/80. She has not had any problems of lightheadedness, weakness, or shortness of breath associated with this.  A: Symptomatic hypoglycemia due to inadequate insulin therapy She is hypotensive from the associated osmotic diuresis but is hemodynamically stable  P: -STAT BMP in clinic today demonstrates a mild AKI but there is no significant acidosis or anion gap present -15 units insulin aspart and 1 liter of intravenous 0.9% NaCl administered with improvement to normal blood pressure negative for orthostatic hypotension and CBG to 233 -Very specific counseling given today about using her insulin therapy at home 30 units twice daily -She will need very close follow up ideally with my clinic on Friday 11/26/16

## 2016-12-26 NOTE — Progress Notes (Signed)
Internal Medicine Clinic Attending  I saw and evaluated the patient.  I personally confirmed the key portions of the history and exam documented by Dr. Rice and I reviewed pertinent patient test results.  The assessment, diagnosis, and plan were formulated together and I agree with the documentation in the resident's note.  

## 2016-12-27 ENCOUNTER — Ambulatory Visit (INDEPENDENT_AMBULATORY_CARE_PROVIDER_SITE_OTHER): Payer: Medicare Other | Admitting: Internal Medicine

## 2016-12-27 ENCOUNTER — Encounter: Payer: Self-pay | Admitting: Internal Medicine

## 2016-12-27 VITALS — BP 108/64 | HR 87 | Temp 97.7°F | Ht 63.0 in | Wt 188.5 lb

## 2016-12-27 DIAGNOSIS — Z9189 Other specified personal risk factors, not elsewhere classified: Secondary | ICD-10-CM

## 2016-12-27 DIAGNOSIS — E1122 Type 2 diabetes mellitus with diabetic chronic kidney disease: Secondary | ICD-10-CM | POA: Diagnosis not present

## 2016-12-27 DIAGNOSIS — N182 Chronic kidney disease, stage 2 (mild): Secondary | ICD-10-CM | POA: Diagnosis not present

## 2016-12-27 DIAGNOSIS — Z7982 Long term (current) use of aspirin: Secondary | ICD-10-CM | POA: Diagnosis not present

## 2016-12-27 DIAGNOSIS — Z794 Long term (current) use of insulin: Secondary | ICD-10-CM | POA: Diagnosis not present

## 2016-12-27 DIAGNOSIS — F17210 Nicotine dependence, cigarettes, uncomplicated: Secondary | ICD-10-CM | POA: Diagnosis not present

## 2016-12-27 DIAGNOSIS — K219 Gastro-esophageal reflux disease without esophagitis: Secondary | ICD-10-CM

## 2016-12-27 DIAGNOSIS — I5042 Chronic combined systolic (congestive) and diastolic (congestive) heart failure: Secondary | ICD-10-CM

## 2016-12-27 DIAGNOSIS — E1142 Type 2 diabetes mellitus with diabetic polyneuropathy: Secondary | ICD-10-CM

## 2016-12-27 DIAGNOSIS — E782 Mixed hyperlipidemia: Secondary | ICD-10-CM

## 2016-12-27 DIAGNOSIS — E785 Hyperlipidemia, unspecified: Secondary | ICD-10-CM | POA: Diagnosis not present

## 2016-12-27 DIAGNOSIS — B372 Candidiasis of skin and nail: Secondary | ICD-10-CM

## 2016-12-27 DIAGNOSIS — E1165 Type 2 diabetes mellitus with hyperglycemia: Secondary | ICD-10-CM

## 2016-12-27 DIAGNOSIS — G6289 Other specified polyneuropathies: Secondary | ICD-10-CM

## 2016-12-27 LAB — GLUCOSE, CAPILLARY: Glucose-Capillary: 135 mg/dL — ABNORMAL HIGH (ref 65–99)

## 2016-12-27 MED ORDER — PANTOPRAZOLE SODIUM 40 MG PO TBEC: DELAYED_RELEASE_TABLET | ORAL | 0 refills | Status: DC

## 2016-12-27 MED ORDER — FLUCONAZOLE 150 MG PO TABS: 150.0000 mg | ORAL_TABLET | Freq: Every day | ORAL | 0 refills | Status: DC

## 2016-12-27 MED ORDER — ATORVASTATIN CALCIUM 40 MG PO TABS: 40.0000 mg | ORAL_TABLET | Freq: Every day | ORAL | 0 refills | Status: DC

## 2016-12-27 MED ORDER — INSULIN ASPART PROT & ASPART (70-30 MIX) 100 UNIT/ML ~~LOC~~ SUSP: 30.0000 [IU] | Freq: Two times a day (BID) | SUBCUTANEOUS | 4 refills | Status: DC

## 2016-12-27 MED ORDER — AMITRIPTYLINE HCL 100 MG PO TABS: 100.0000 mg | ORAL_TABLET | Freq: Every day | ORAL | 0 refills | Status: DC

## 2016-12-27 MED ORDER — METFORMIN HCL ER 500 MG PO TB24: 500.0000 mg | ORAL_TABLET | Freq: Every day | ORAL | 0 refills | Status: DC

## 2016-12-27 MED FILL — ATORVASTATIN 40 MG TABLET: 40 | 30 days supply | Qty: 30 | Fill #0

## 2016-12-27 MED FILL — FLUCONAZOLE 150 MG TABLET: 150 | 5 days supply | Qty: 5 | Fill #0

## 2016-12-27 MED FILL — AMITRIPTYLINE HCL 100 MG TA: 100 | 30 days supply | Qty: 30 | Fill #0

## 2016-12-27 MED FILL — METFORMIN HCL ER 500 MG TAB: 500 | 30 days supply | Qty: 30 | Fill #0

## 2016-12-27 NOTE — Patient Instructions (Signed)
I am glad to see you looking better each day Linda Miller! I have sent several prescriptions to the outpatient pharmacy to hopefully keep you well over the next 3 months. If you find yourself having trouble getting these or running out please do not wait to call our clinic back to address the problem!  -STOP taking the victoza, this may have been causing some of your upset stomach.  -Change metformin to 500mg  extended release once daily  -Take fluconazole once daily for 5 days for your yeast rash  -Take pantoprazole 40mg  once daily for acid reflux  -Take Amitrityline 100mg  once daily at night for neuropathy and sleep  -Take atorvastatin 40mg  once daily for cholesterol to protect your heart.

## 2016-12-27 NOTE — Progress Notes (Signed)
   CC: Follow up for diabetes  HPI:  Linda Miller is a 61 y.o. woman here for close follow up of her uncontrolled type 2 diabetes and lack of access to medications.   See problem based assessment and plan below for additional details  Past Medical History:  Diagnosis Date  . Diabetes mellitus   . GERD (gastroesophageal reflux disease) 03/29/2016  . High cholesterol   . Hypertension   . Osteomyelitis of right foot (HCC)     Review of Systems:  Review of Systems  Constitutional: Negative for fever.  Eyes: Negative for blurred vision.  Respiratory: Negative for cough.   Cardiovascular: Negative for chest pain.  Gastrointestinal: Positive for diarrhea and nausea. Negative for abdominal pain.  Genitourinary: Negative for dysuria.  Skin: Positive for itching and rash.  Neurological: Negative for dizziness.  Endo/Heme/Allergies: Negative for polydipsia.    Physical Exam: Physical Exam  Constitutional: She is well-developed, well-nourished, and in no distress.  HENT:  Head: Normocephalic and atraumatic.  Mouth/Throat: No oropharyngeal exudate.  Eyes: Conjunctivae are normal.  Neck: Normal range of motion.  Cardiovascular: Normal rate and regular rhythm.   Pulmonary/Chest: Effort normal and breath sounds normal.  Abdominal: Soft. There is no tenderness.  Musculoskeletal: She exhibits no edema.  Lymphadenopathy:    She has no cervical adenopathy.  Skin:  Erythematous rash with satellite lesions under breasts and in groin, without skin breakdown  Psychiatric: Affect normal.    Vitals:   12/27/16 1338  BP: 108/64  Pulse: 87  Temp: 97.7 F (36.5 C)  TempSrc: Oral  SpO2: 100%  Weight: 188 lb 8 oz (85.5 kg)  Height: 5\' 3"  (1.6 m)    Assessment & Plan:   See Encounters Tab for problem based charting.  Patient discussed with Dr. Heide SparkNarendra

## 2016-12-30 NOTE — Progress Notes (Signed)
Internal Medicine Clinic Attending  Case discussed with Dr. Rice at the time of the visit.  We reviewed the resident's history and exam and pertinent patient test results.  I agree with the assessment, diagnosis, and plan of care documented in the resident's note.  

## 2016-12-30 NOTE — Assessment & Plan Note (Signed)
Resuming atorvastatin 40mg  daily today x90day supply through [facility]

## 2016-12-30 NOTE — Assessment & Plan Note (Signed)
HPI: She has been mildly hypotensive at clinic visits for the past week due. She has also been having polyuria, upper airway infection, or diarrhea at each of these visits. She has been off all her medications during January without obvious exacerbation of her known heart failure.  A: Not currently in exacerbation off medications She may need to get back onto treatment once she is no longer becoming volume depleted due to osmotic diuresis or diarrhea. She had a borderline AKI at the clinic visit on 1/30 due to this. For now I will recommend holding these medications.  P: -Hold off on previous lisinopril, metoprolol, and lasix for now -Reassess for edema, Bmet, and hypertension at next clinic visit

## 2016-12-30 NOTE — Assessment & Plan Note (Addendum)
HPI: [name] is very much improved since her visit on Tuesday. She reports significant decreased to her headaches and polydipsia. She is taking her Victoza, metformin, and insulin as instructed. Her biggest symptomatic problem actually seems to be some GI upset particularly diarrhea and mild nausea. She has not been checking CBGs with particular regularity but has seen a significant decrease down to the 200s.  A: Uncontrolled insulin-dependent type 2 diabetes now with a very large improvement in her hyperglycemia due to resuming medications I strongly suspect some of her GI symptoms to be an adverse effect from either the metformin or Victoza. Metformin is more commonly associated with these complaints but she has tolerated well in the past. Victoza can also be associated with GI symptoms and she was never on this medicine in the past. CBG in the clinic today is 135 which, given her history of severely uncontrolled blood sugars, is pretty low.  P: -Switch metformin 1000mg  BID to metformin ER 1000mg  once daily -Discontinue liraglutide -Continue insulin 70/30 30 units BID -Follow up in a few weeks to see resolution of GI symptoms, can consider getting back on Victoza if improved

## 2016-12-30 NOTE — Assessment & Plan Note (Signed)
HPI: She had some improvement but continues to have a characteristic rash today with mild symptoms.  A: Candidal intertrigo secondary to uncontrolled hyperglycemia  P: -Ordered fluconazole 150mg  PO daily x5days

## 2016-12-30 NOTE — Assessment & Plan Note (Signed)
A: I do not think this is a major cause of her current GI symptoms at the moment.  P: Resuming pantoprazole 40mg  daily today [facility]90day supply [facility]

## 2016-12-30 NOTE — Assessment & Plan Note (Signed)
Resuming amitriptyline 100mg  daily today [facility]90day supply [facility]

## 2017-01-24 ENCOUNTER — Encounter: Payer: Medicare Other | Admitting: Internal Medicine

## 2017-01-31 ENCOUNTER — Ambulatory Visit: Payer: Medicare Other

## 2017-02-06 ENCOUNTER — Ambulatory Visit: Payer: Medicare Other

## 2017-02-12 ENCOUNTER — Telehealth: Payer: Self-pay | Admitting: Internal Medicine

## 2017-02-12 NOTE — Telephone Encounter (Signed)
APT. REMINDER CALL, NO ANSWER, NO VOICEMAIL °

## 2017-02-13 ENCOUNTER — Encounter (INDEPENDENT_AMBULATORY_CARE_PROVIDER_SITE_OTHER): Payer: Self-pay

## 2017-02-13 ENCOUNTER — Ambulatory Visit: Payer: Medicare Other | Admitting: Internal Medicine

## 2017-02-13 NOTE — Progress Notes (Signed)
Patient changed her mind and would rather come back and see Dr. Dimple Caseyice her PCP. She did not want to be seen by me today. We notified her to call us if needed before her appt on May 18th with Dr. Dimple Caseyice.

## 2017-02-17 ENCOUNTER — Other Ambulatory Visit: Payer: Self-pay | Admitting: Internal Medicine

## 2017-02-17 ENCOUNTER — Other Ambulatory Visit: Payer: Self-pay | Admitting: Cardiovascular Disease

## 2017-02-17 DIAGNOSIS — N182 Chronic kidney disease, stage 2 (mild): Principal | ICD-10-CM

## 2017-02-17 DIAGNOSIS — I10 Essential (primary) hypertension: Secondary | ICD-10-CM

## 2017-02-17 DIAGNOSIS — Z794 Long term (current) use of insulin: Principal | ICD-10-CM

## 2017-02-17 DIAGNOSIS — I5042 Chronic combined systolic (congestive) and diastolic (congestive) heart failure: Secondary | ICD-10-CM

## 2017-02-17 DIAGNOSIS — E1122 Type 2 diabetes mellitus with diabetic chronic kidney disease: Secondary | ICD-10-CM

## 2017-02-17 NOTE — Telephone Encounter (Signed)
Please review for refill. Thanks!  

## 2017-02-18 NOTE — Telephone Encounter (Signed)
REFILL 

## 2017-04-10 ENCOUNTER — Telehealth: Payer: Self-pay | Admitting: Internal Medicine

## 2017-04-10 NOTE — Telephone Encounter (Signed)
APT. REMINDER CALL, LMTCB °

## 2017-04-11 ENCOUNTER — Encounter (INDEPENDENT_AMBULATORY_CARE_PROVIDER_SITE_OTHER): Payer: Self-pay

## 2017-04-11 ENCOUNTER — Ambulatory Visit (INDEPENDENT_AMBULATORY_CARE_PROVIDER_SITE_OTHER): Payer: Medicare HMO | Admitting: Internal Medicine

## 2017-04-11 ENCOUNTER — Encounter: Payer: Self-pay | Admitting: Internal Medicine

## 2017-04-11 VITALS — BP 121/99 | HR 100 | Temp 98.8°F | Ht 63.0 in | Wt 179.4 lb

## 2017-04-11 DIAGNOSIS — B372 Candidiasis of skin and nail: Secondary | ICD-10-CM | POA: Diagnosis not present

## 2017-04-11 DIAGNOSIS — E1122 Type 2 diabetes mellitus with diabetic chronic kidney disease: Secondary | ICD-10-CM

## 2017-04-11 DIAGNOSIS — E1165 Type 2 diabetes mellitus with hyperglycemia: Secondary | ICD-10-CM | POA: Diagnosis not present

## 2017-04-11 DIAGNOSIS — N182 Chronic kidney disease, stage 2 (mild): Secondary | ICD-10-CM

## 2017-04-11 DIAGNOSIS — B373 Candidiasis of vulva and vagina: Secondary | ICD-10-CM

## 2017-04-11 DIAGNOSIS — Z794 Long term (current) use of insulin: Secondary | ICD-10-CM | POA: Diagnosis not present

## 2017-04-11 LAB — GLUCOSE, CAPILLARY: Glucose-Capillary: 495 mg/dL — ABNORMAL HIGH (ref 65–99)

## 2017-04-11 LAB — POCT GLYCOSYLATED HEMOGLOBIN (HGB A1C)

## 2017-04-11 MED ORDER — METFORMIN HCL ER 500 MG PO TB24: 500.0000 mg | ORAL_TABLET | Freq: Every day | ORAL | 0 refills | Status: DC

## 2017-04-11 MED ORDER — FLUCONAZOLE 150 MG PO TABS: 150.0000 mg | ORAL_TABLET | Freq: Every day | ORAL | 0 refills | Status: DC

## 2017-04-11 MED ORDER — "INSULIN SYRINGE-NEEDLE U-100 31G X 15/64"" 0.3 ML MISC": 1.0000 | Freq: Two times a day (BID) | 2 refills | Status: AC

## 2017-04-11 MED ORDER — INSULIN ASPART PROT & ASPART (70-30 MIX) 100 UNIT/ML ~~LOC~~ SUSP: 30.0000 [IU] | Freq: Two times a day (BID) | SUBCUTANEOUS | 5 refills | Status: DC

## 2017-04-11 NOTE — Progress Notes (Signed)
   CC: Hyperglycemia  HPI:  [name] is a 61 y.o. woman here today with symptomatic hyperglycemia due to running out of her diabetes medications since about 3 weeks ago.   See problem based assessment and plan below for additional details  Past Medical History:  Diagnosis Date  . Diabetes mellitus   . GERD (gastroesophageal reflux disease) 03/29/2016  . High cholesterol   . Hypertension   . Osteomyelitis of right foot (HCC)     Review of Systems:  Review of Systems  Constitutional: Positive for malaise/fatigue. Negative for chills and fever.  Eyes: Positive for blurred vision.  Respiratory: Negative for cough and shortness of breath.   Cardiovascular: Negative for chest pain and leg swelling.  Gastrointestinal: Negative for diarrhea, nausea and vomiting.  Genitourinary: Positive for frequency. Negative for dysuria and hematuria.  Skin: Positive for itching and rash.  Neurological: Positive for headaches.  Endo/Heme/Allergies: Positive for polydipsia.    Physical Exam: Physical Exam  Constitutional: She is well-developed, well-nourished, and in no distress.  HENT:  Relatively dry oral mucosa  Eyes: Conjunctivae are normal.  Cardiovascular: Normal rate and regular rhythm.   Pulmonary/Chest: Effort normal and breath sounds normal.  Genitourinary:  Genitourinary Comments: Vulvovaginal candidal rash present extending to intertriginous rash on groin, pannus  Skin: Skin is warm and dry.  Extensive candidal rash with erythema and satellite lesions in bilateral axilla, under breasts, and groin  Psychiatric: Mood and affect normal.    Vitals:   04/11/17 1447  BP: (!) 121/99  Pulse: 100  Temp: 98.8 F (37.1 C)  TempSrc: Oral  SpO2: 100%  Weight: 179 lb 6.4 oz (81.4 kg)  Height: 5\' 3"  (1.6 m)    Assessment & Plan:   See Encounters Tab for problem based charting.  Patient discussed with Dr. Criselda PeachesMullen

## 2017-04-11 NOTE — Patient Instructions (Signed)
It was a pleasure to see you today Linda Miller.  You are suffering from very high blood sugar today causing your rash, vision change, thirst, and fatigue. The most important treatment for this is to restart your diabetes medicines.  You should start taking your novolog 70/30 insulin 30 units twice daily before meals (breakfast, lunch usually). Resume taking your metformin 500mg  every day. Also take the fluconazole 150mg  by mouth once daily for 5 days to treat your rash.  I am also checking your blood test today, if it is abnormal in a dangerous way we may need to call you to be seen at the urgent care or ED.  It is very important we see you in clinic again next week.

## 2017-04-12 LAB — BMP8+ANION GAP
Anion Gap: 20 mmol/L — ABNORMAL HIGH (ref 10.0–18.0)
BUN/Creatinine Ratio: 11 (ref 9–23)
BUN: 14 mg/dL (ref 6–24)
CALCIUM: 10 mg/dL (ref 8.7–10.2)
CO2: 21 mmol/L (ref 18–29)
CREATININE: 1.3 mg/dL — AB (ref 0.57–1.00)
Chloride: 88 mmol/L — ABNORMAL LOW (ref 96–106)
GFR, EST AFRICAN AMERICAN: 52 mL/min/{1.73_m2} — AB (ref >59)
GFR, EST NON AFRICAN AMERICAN: 45 mL/min/{1.73_m2} — AB (ref >59)
Glucose: 512 mg/dL (ref 65–99)
POTASSIUM: 5.3 mmol/L — AB (ref 3.5–5.2)
Sodium: 129 mmol/L — ABNORMAL LOW (ref 134–144)

## 2017-04-14 ENCOUNTER — Telehealth: Payer: Self-pay | Admitting: *Deleted

## 2017-04-14 NOTE — Telephone Encounter (Signed)
Talked to Dr Dimple Caseyice - stated aware pt's Glucose (BMP on 5/18) 512. Stated he has been trying to call pt.

## 2017-04-14 NOTE — Progress Notes (Signed)
Internal Medicine Clinic Attending  Case discussed with Dr. Dimple Caseyice at the time of the visit.  We reviewed the resident's history and exam and pertinent patient test results.  I agree with the assessment, diagnosis, and plan of care documented in the resident's note.  BMET reviewed, she had mild hyperkalemia, worsening renal function (1.3), Bicarb 21 and an anion gap.  She should be called asap today (Monday) to ensure that she restarted her insulin.  She may need quicker follow up than 1 week.

## 2017-04-14 NOTE — Assessment & Plan Note (Signed)
HPI: She was feeling well after transition to metformin and insulin at the start of February and stopping having diarrhea and stomach upset after discontinuing victoza. Originally follow up was scheduled in March but she declined that visit with Dr. Tasia CatchingsAhmed but did not make a new one until now. As a result, she ran out of her previous prescriptions since mid April and has been completely untreated for her diabetes since that time. Her home glucometer readings had been in the 200s on medication, now reading as "HI". Hgb A1c checked in clinic was >14% and POCT CBG of 512. She has progressive skin rashes in her armpits, under breasts, lower abdomen, and groin that are pruritic and slightly painful in the groin due to friction when walking. She has worsened nearsightedness, fatigue, thirst, and urinary frequency. The skin rash is her main complaint out of these symptoms today.  A: Symptomatic hyperglycemia secondary to lack of access to diabetes medications. This is an unfortunate outcome of inadequate communication. Her presentation is similar to that seen in January of this year although she is less acutely ill than at that time. She is clinically dehydrated from a related osmotic diuresis so we will need to check serum chemistry for her renal function and any acidosis.   P: Check Bmet today Insulin 70/30 vials provided in clinic to resume 30U BID same day Restart metformin 500mg  XR daily same day She expresses a very strong preference for seeing her PCP as follow up visits so I will see her in 1 week unless she is found to have severe renal insufficiency or acidosis

## 2017-04-14 NOTE — Assessment & Plan Note (Addendum)
Her rash is secondary to severely elevated blood sugars for weeks. This is actually her primary complaint at clinic today. Rash is pretty severe in her groin but without any skin breakdown. We will treat with fluconazole 150mg  PO x5 days. I will reexamine this at her follow up next week.

## 2017-04-16 ENCOUNTER — Telehealth: Payer: Self-pay | Admitting: *Deleted

## 2017-04-16 NOTE — Telephone Encounter (Signed)
Will speak to doriss. Tomorrow, she left early today and add pt on for Friday per conversation w/ dr Dimple Caseyrice

## 2017-04-16 NOTE — Telephone Encounter (Signed)
Hi pt called and stated she spoke to you 5/22 and you told her to come in Friday to see you, is this accurate? What time, your schedule is full so it will be 1 extra, please advise

## 2017-04-17 NOTE — Telephone Encounter (Signed)
Spoke w/ pt, she states she will be here for appt w/ dr rice tomorrow, will get copay from fund.

## 2017-04-18 ENCOUNTER — Ambulatory Visit (INDEPENDENT_AMBULATORY_CARE_PROVIDER_SITE_OTHER): Payer: Medicare HMO | Admitting: Internal Medicine

## 2017-04-18 ENCOUNTER — Encounter: Payer: Self-pay | Admitting: Internal Medicine

## 2017-04-18 VITALS — BP 110/61 | HR 84 | Temp 98.0°F | Ht 63.0 in | Wt 191.5 lb

## 2017-04-18 DIAGNOSIS — E1165 Type 2 diabetes mellitus with hyperglycemia: Secondary | ICD-10-CM | POA: Diagnosis not present

## 2017-04-18 DIAGNOSIS — B372 Candidiasis of skin and nail: Secondary | ICD-10-CM

## 2017-04-18 DIAGNOSIS — E1122 Type 2 diabetes mellitus with diabetic chronic kidney disease: Secondary | ICD-10-CM

## 2017-04-18 DIAGNOSIS — F17210 Nicotine dependence, cigarettes, uncomplicated: Secondary | ICD-10-CM

## 2017-04-18 DIAGNOSIS — K0889 Other specified disorders of teeth and supporting structures: Secondary | ICD-10-CM

## 2017-04-18 DIAGNOSIS — K089 Disorder of teeth and supporting structures, unspecified: Secondary | ICD-10-CM

## 2017-04-18 DIAGNOSIS — N182 Chronic kidney disease, stage 2 (mild): Secondary | ICD-10-CM

## 2017-04-18 DIAGNOSIS — Z794 Long term (current) use of insulin: Secondary | ICD-10-CM | POA: Diagnosis not present

## 2017-04-18 LAB — GLUCOSE, CAPILLARY: Glucose-Capillary: 132 mg/dL — ABNORMAL HIGH (ref 65–99)

## 2017-04-18 NOTE — Progress Notes (Signed)
   CC: Follow up for hyperglycemia and diabetes  HPI:  [name] is a 61 y.o. woman here today for one week follow up of a visit for symptomatic hyperglycemia due to lack of access to diabetes medications.  See problem based assessment and plan below for additional details  Past Medical History:  Diagnosis Date  . Diabetes mellitus   . GERD (gastroesophageal reflux disease) 03/29/2016  . High cholesterol   . Hypertension   . Osteomyelitis of right foot (HCC)     Review of Systems:  Review of Systems  Constitutional: Negative for malaise/fatigue.  Eyes: Negative for blurred vision.  Cardiovascular: Negative for leg swelling.  Gastrointestinal: Negative for abdominal pain.  Genitourinary: Positive for frequency.  Skin: Positive for itching and rash.  Neurological: Negative for sensory change and headaches.  Endo/Heme/Allergies: Positive for polydipsia.    Physical Exam: Physical Exam  Constitutional: She is well-developed, well-nourished, and in no distress.  HENT:  Mouth/Throat: Oropharynx is clear and moist. No oropharyngeal exudate.  Damaged left upper molars without surrounding swelling or erythema.  Cardiovascular: Normal rate and regular rhythm.   Pulmonary/Chest: Effort normal and breath sounds normal.  Abdominal: Soft. She exhibits no distension. There is no tenderness.  Musculoskeletal: She exhibits no edema.  Skin:  Scattered pruritic lesions in bilateral axilla, healing b/l groin rash    Vitals:   04/18/17 1343  BP: 110/61  Pulse: 84  Temp: 98 F (36.7 C)  TempSrc: Oral  SpO2: 100%  Weight: 191 lb 8 oz (86.9 kg)  Height: 5\' 3"  (1.6 m)     Assessment & Plan:   See Encounters Tab for problem based charting.  Patient discussed with Dr. Oswaldo DoneVincent

## 2017-04-18 NOTE — Patient Instructions (Addendum)
It was a pleasure to see you today Linda Miller.  I am so glad you are feeling better with your blood sugar under control. We are checking your blood work today to make sure your kidney function has returned to normal with you getting over dehydration.  You should finish your 5 days total of fluconazole for the yeast rash under your arms.  I will plan to see you again next week and see how we are doing.

## 2017-04-19 LAB — BMP8+ANION GAP
ANION GAP: 14 mmol/L (ref 10.0–18.0)
BUN/Creatinine Ratio: 8 — ABNORMAL LOW (ref 9–23)
BUN: 8 mg/dL (ref 6–24)
CALCIUM: 9 mg/dL (ref 8.7–10.2)
CHLORIDE: 104 mmol/L (ref 96–106)
CO2: 26 mmol/L (ref 18–29)
Creatinine, Ser: 0.98 mg/dL (ref 0.57–1.00)
GFR calc Af Amer: 73 mL/min/{1.73_m2} (ref >59)
GFR calc non Af Amer: 63 mL/min/{1.73_m2} (ref >59)
Glucose: 103 mg/dL — ABNORMAL HIGH (ref 65–99)
POTASSIUM: 3.8 mmol/L (ref 3.5–5.2)
Sodium: 144 mmol/L (ref 134–144)

## 2017-04-21 NOTE — Assessment & Plan Note (Signed)
HPI: She is feeling much better today with improvement in her myopia, thirst, fatigue, and pruritis. She resumed use of her home metformin and 70/30 insulin since Friday after her last clinic visit. She is checking home CBGs with readings ranging in the 100s-200s. She has gained about 10 pounds back over the past week with drinking large amounts of water.  A: Improving symptomatic hyperglycemia with resuming diabetes medication at home. The main reason for reassessment this week was her AKI last week from dehydration with borderline anion gap acidosis.  P: Continue insulin 70/30 30U BID, plus metformin I asked her to continued checking home CBGs and bring glucometer to her visit next week. Repeat Bmet today - Renal function is improved back to baseline with glycemic control and rehydration

## 2017-04-21 NOTE — Assessment & Plan Note (Signed)
She now has trouble with a cracked L upper molar. I recommended she try to contact any medicare dental providers in the area. I do not feel there is any evidence of infection at this time.

## 2017-04-21 NOTE — Assessment & Plan Note (Signed)
Her skin rash is much improved in the groin and partially improved under her axilla. She only took her fluconazole for 2 days then stopped, unsure if she needed to take them all at once. I recommended she continue her course and finish 5 doses total. We will check skin rash again next week.

## 2017-04-22 NOTE — Progress Notes (Signed)
Internal Medicine Clinic Attending  Case discussed with Dr. Rice at the time of the visit.  We reviewed the resident's history and exam and pertinent patient test results.  I agree with the assessment, diagnosis, and plan of care documented in the resident's note.  

## 2017-04-25 ENCOUNTER — Encounter: Payer: Medicare HMO | Admitting: Internal Medicine

## 2017-04-28 ENCOUNTER — Ambulatory Visit: Payer: Medicare HMO

## 2017-05-19 ENCOUNTER — Other Ambulatory Visit: Payer: Self-pay | Admitting: Pulmonary Disease

## 2017-05-19 ENCOUNTER — Other Ambulatory Visit: Payer: Self-pay | Admitting: Internal Medicine

## 2017-05-19 DIAGNOSIS — E1122 Type 2 diabetes mellitus with diabetic chronic kidney disease: Secondary | ICD-10-CM

## 2017-05-19 DIAGNOSIS — Z794 Long term (current) use of insulin: Principal | ICD-10-CM

## 2017-05-19 DIAGNOSIS — N182 Chronic kidney disease, stage 2 (mild): Principal | ICD-10-CM

## 2017-05-23 ENCOUNTER — Other Ambulatory Visit: Payer: Self-pay | Admitting: *Deleted

## 2017-05-23 DIAGNOSIS — Z794 Long term (current) use of insulin: Principal | ICD-10-CM

## 2017-05-23 DIAGNOSIS — G6289 Other specified polyneuropathies: Secondary | ICD-10-CM

## 2017-05-23 DIAGNOSIS — N182 Chronic kidney disease, stage 2 (mild): Principal | ICD-10-CM

## 2017-05-23 DIAGNOSIS — E782 Mixed hyperlipidemia: Secondary | ICD-10-CM

## 2017-05-23 DIAGNOSIS — E1122 Type 2 diabetes mellitus with diabetic chronic kidney disease: Secondary | ICD-10-CM

## 2017-05-23 MED ORDER — AMITRIPTYLINE HCL 100 MG PO TABS: 100.0000 mg | ORAL_TABLET | Freq: Every day | ORAL | 1 refills | Status: DC

## 2017-05-23 MED ORDER — ACCU-CHEK FASTCLIX LANCETS MISC: 6 refills | Status: AC

## 2017-05-23 MED ORDER — ATORVASTATIN CALCIUM 40 MG PO TABS: 40.0000 mg | ORAL_TABLET | Freq: Every day | ORAL | 1 refills | Status: DC

## 2017-05-26 ENCOUNTER — Emergency Department (HOSPITAL_COMMUNITY)
Admission: EM | Admit: 2017-05-26 | Discharge: 2017-05-26 | Disposition: A | Discharge status: Home / Self Care | Payer: Medicare HMO | Attending: Emergency Medicine | Admitting: Emergency Medicine

## 2017-05-26 ENCOUNTER — Encounter (HOSPITAL_COMMUNITY): Payer: Self-pay | Admitting: Emergency Medicine

## 2017-05-26 DIAGNOSIS — Y939 Activity, unspecified: Secondary | ICD-10-CM | POA: Insufficient documentation

## 2017-05-26 DIAGNOSIS — W57XXXA Bitten or stung by nonvenomous insect and other nonvenomous arthropods, initial encounter: Secondary | ICD-10-CM | POA: Insufficient documentation

## 2017-05-26 DIAGNOSIS — E119 Type 2 diabetes mellitus without complications: Secondary | ICD-10-CM | POA: Diagnosis not present

## 2017-05-26 DIAGNOSIS — T63301A Toxic effect of unspecified spider venom, accidental (unintentional), initial encounter: Secondary | ICD-10-CM | POA: Diagnosis not present

## 2017-05-26 DIAGNOSIS — S80861A Insect bite (nonvenomous), right lower leg, initial encounter: Secondary | ICD-10-CM | POA: Insufficient documentation

## 2017-05-26 DIAGNOSIS — Y999 Unspecified external cause status: Secondary | ICD-10-CM | POA: Insufficient documentation

## 2017-05-26 DIAGNOSIS — Y929 Unspecified place or not applicable: Secondary | ICD-10-CM | POA: Insufficient documentation

## 2017-05-26 DIAGNOSIS — S80869A Insect bite (nonvenomous), unspecified lower leg, initial encounter: Secondary | ICD-10-CM | POA: Diagnosis not present

## 2017-05-26 HISTORY — DX: Polyneuropathy, unspecified: G62.9

## 2017-05-26 HISTORY — DX: Heart failure, unspecified: I50.9

## 2017-05-26 HISTORY — DX: Anxiety disorder, unspecified: F41.9

## 2017-05-26 LAB — COMPREHENSIVE METABOLIC PANEL
ALK PHOS: 70 U/L (ref 38–126)
ALT: 9 U/L — AB (ref 14–54)
AST: 11 U/L — ABNORMAL LOW (ref 15–41)
Albumin: 3.7 g/dL (ref 3.5–5.0)
Anion gap: 8 (ref 5–15)
BILIRUBIN TOTAL: 0.6 mg/dL (ref 0.3–1.2)
BUN: 7 mg/dL (ref 6–20)
CALCIUM: 9.5 mg/dL (ref 8.9–10.3)
CO2: 28 mmol/L (ref 22–32)
CREATININE: 0.88 mg/dL (ref 0.44–1.00)
Chloride: 100 mmol/L — ABNORMAL LOW (ref 101–111)
Glucose, Bld: 386 mg/dL — ABNORMAL HIGH (ref 65–99)
Potassium: 4.3 mmol/L (ref 3.5–5.1)
Sodium: 136 mmol/L (ref 135–145)
Total Protein: 7.5 g/dL (ref 6.5–8.1)

## 2017-05-26 LAB — CBC WITH DIFFERENTIAL/PLATELET
Basophils Absolute: 0 10*3/uL (ref 0.0–0.1)
Basophils Relative: 0 %
Eosinophils Absolute: 0.1 10*3/uL (ref 0.0–0.7)
Eosinophils Relative: 0 %
HEMATOCRIT: 38.8 % (ref 36.0–46.0)
HEMOGLOBIN: 12.1 g/dL (ref 12.0–15.0)
LYMPHS ABS: 3 10*3/uL (ref 0.7–4.0)
Lymphocytes Relative: 22 %
MCH: 24.2 pg — AB (ref 26.0–34.0)
MCHC: 31.2 g/dL (ref 30.0–36.0)
MCV: 77.4 fL — AB (ref 78.0–100.0)
Monocytes Absolute: 0.8 10*3/uL (ref 0.1–1.0)
Monocytes Relative: 6 %
NEUTROS PCT: 72 %
Neutro Abs: 9.6 10*3/uL — ABNORMAL HIGH (ref 1.7–7.7)
Platelets: 298 10*3/uL (ref 150–400)
RBC: 5.01 MIL/uL (ref 3.87–5.11)
RDW: 16.5 % — ABNORMAL HIGH (ref 11.5–15.5)
WBC: 13.5 10*3/uL — AB (ref 4.0–10.5)

## 2017-05-26 NOTE — ED Notes (Signed)
No answer x3 for vitals and room

## 2017-05-26 NOTE — ED Triage Notes (Signed)
Pt to ED via [facility] with c/o spider bite to right leg just above the knee.  Pt c/o pain and swelling to right knee and leg.  Pt is diabetic

## 2017-05-27 ENCOUNTER — Other Ambulatory Visit: Payer: Self-pay

## 2017-05-27 DIAGNOSIS — E1122 Type 2 diabetes mellitus with diabetic chronic kidney disease: Secondary | ICD-10-CM

## 2017-05-27 DIAGNOSIS — N182 Chronic kidney disease, stage 2 (mild): Principal | ICD-10-CM

## 2017-05-27 DIAGNOSIS — Z794 Long term (current) use of insulin: Principal | ICD-10-CM

## 2017-05-27 MED ORDER — LOSARTAN POTASSIUM 50 MG PO TABS: ORAL_TABLET | ORAL | 1 refills | Status: DC

## 2017-05-27 MED ORDER — INSULIN ASPART PROT & ASPART (70-30 MIX) 100 UNIT/ML ~~LOC~~ SUSP: 30.0000 [IU] | Freq: Two times a day (BID) | SUBCUTANEOUS | 5 refills | Status: DC

## 2017-05-27 MED ORDER — METFORMIN HCL ER 500 MG PO TB24: 500.0000 mg | ORAL_TABLET | Freq: Every day | ORAL | 1 refills | Status: DC

## 2017-05-30 ENCOUNTER — Encounter: Payer: Self-pay | Admitting: Internal Medicine

## 2017-05-30 ENCOUNTER — Encounter: Payer: Medicare HMO | Admitting: Internal Medicine

## 2017-06-04 ENCOUNTER — Other Ambulatory Visit: Payer: Self-pay | Admitting: Cardiovascular Disease

## 2017-06-04 ENCOUNTER — Other Ambulatory Visit: Payer: Self-pay

## 2017-06-04 ENCOUNTER — Other Ambulatory Visit: Payer: Self-pay | Admitting: Internal Medicine

## 2017-06-04 DIAGNOSIS — E1122 Type 2 diabetes mellitus with diabetic chronic kidney disease: Secondary | ICD-10-CM

## 2017-06-04 DIAGNOSIS — Z794 Long term (current) use of insulin: Principal | ICD-10-CM

## 2017-06-04 DIAGNOSIS — I10 Essential (primary) hypertension: Secondary | ICD-10-CM

## 2017-06-04 DIAGNOSIS — N182 Chronic kidney disease, stage 2 (mild): Principal | ICD-10-CM

## 2017-06-04 DIAGNOSIS — I5042 Chronic combined systolic (congestive) and diastolic (congestive) heart failure: Secondary | ICD-10-CM

## 2017-06-04 NOTE — Telephone Encounter (Signed)
insulin aspart protamine- aspart (NOVOLOG MIX 70/30) (70-30) 100 UNIT/ML injection,  losartan (COZAAR) 50 MG tablet,  metFORMIN (GLUCOPHAGE-XR) 500 MG 24 hr tablet,  Refill request  @ [facility].

## 2017-06-05 NOTE — Telephone Encounter (Signed)
Refill Request.  

## 2017-06-05 NOTE — Telephone Encounter (Signed)
Pt is requesting refills sent to [facility]. Thanks

## 2017-06-05 NOTE — Telephone Encounter (Signed)
Rx(s) sent to pharmacy electronically.  

## 2017-06-06 ENCOUNTER — Other Ambulatory Visit: Payer: Self-pay | Admitting: *Deleted

## 2017-06-06 DIAGNOSIS — I5042 Chronic combined systolic (congestive) and diastolic (congestive) heart failure: Secondary | ICD-10-CM

## 2017-06-06 DIAGNOSIS — I10 Essential (primary) hypertension: Secondary | ICD-10-CM

## 2017-06-06 MED ORDER — METOPROLOL TARTRATE 25 MG PO TABS: 25.0000 mg | ORAL_TABLET | Freq: Two times a day (BID) | ORAL | 3 refills | Status: AC

## 2017-06-06 MED ORDER — ACCU-CHEK AVIVA PLUS W/DEVICE KIT: PACK | 0 refills | Status: AC

## 2017-06-06 MED ORDER — PANTOPRAZOLE SODIUM 40 MG PO TBEC: 40.0000 mg | DELAYED_RELEASE_TABLET | Freq: Every day | ORAL | 4 refills | Status: DC

## 2017-06-06 MED ORDER — LOSARTAN POTASSIUM 50 MG PO TABS: 50.0000 mg | ORAL_TABLET | Freq: Every day | ORAL | 2 refills | Status: DC

## 2017-06-06 MED ORDER — METFORMIN HCL ER 500 MG PO TB24: 500.0000 mg | ORAL_TABLET | Freq: Every day | ORAL | 1 refills | Status: DC

## 2017-06-06 MED ORDER — INSULIN ASPART PROT & ASPART (70-30 MIX) 100 UNIT/ML ~~LOC~~ SUSP: 30.0000 [IU] | Freq: Two times a day (BID) | SUBCUTANEOUS | 5 refills | Status: DC

## 2017-06-25 ENCOUNTER — Other Ambulatory Visit: Payer: Self-pay | Admitting: *Deleted

## 2017-06-26 MED ORDER — ACCU-CHEK AVIVA VI SOLN: 1.0000 [drp] | 0 refills | Status: DC | PRN

## 2017-06-26 MED ORDER — ACCU-CHEK SOFTCLIX LANCET DEV MISC: 12 refills | Status: AC

## 2017-06-26 MED ORDER — BD SWAB SINGLE USE REGULAR PADS: 1.0000 "application " | MEDICATED_PAD | Freq: Four times a day (QID) | 12 refills | Status: AC

## 2017-06-26 MED ORDER — GLUCOSE BLOOD VI STRP: ORAL_STRIP | 12 refills | Status: AC

## 2017-07-16 ENCOUNTER — Other Ambulatory Visit: Payer: Self-pay | Admitting: Internal Medicine

## 2017-07-18 ENCOUNTER — Encounter: Payer: Medicare HMO | Admitting: Internal Medicine

## 2017-08-07 ENCOUNTER — Other Ambulatory Visit: Payer: Self-pay | Admitting: Internal Medicine

## 2017-08-07 DIAGNOSIS — E1122 Type 2 diabetes mellitus with diabetic chronic kidney disease: Secondary | ICD-10-CM

## 2017-08-07 DIAGNOSIS — Z794 Long term (current) use of insulin: Principal | ICD-10-CM

## 2017-08-07 DIAGNOSIS — N182 Chronic kidney disease, stage 2 (mild): Principal | ICD-10-CM

## 2017-08-08 ENCOUNTER — Other Ambulatory Visit: Payer: Self-pay | Admitting: Internal Medicine

## 2017-08-15 ENCOUNTER — Telehealth: Payer: Self-pay

## 2017-08-15 ENCOUNTER — Encounter: Payer: Medicare HMO | Admitting: Internal Medicine

## 2017-08-15 ENCOUNTER — Other Ambulatory Visit: Payer: Self-pay | Admitting: *Deleted

## 2017-08-15 DIAGNOSIS — B372 Candidiasis of skin and nail: Secondary | ICD-10-CM

## 2017-08-15 NOTE — Telephone Encounter (Signed)
Pt also wanted dr rice to know her mother passed and she is very sad. She would like her diflucan refilled She would appreciate dr rice calling her

## 2017-08-15 NOTE — Telephone Encounter (Signed)
Needs to speak with a nurse about something. Please call pt back.  

## 2017-08-19 MED ORDER — FLUCONAZOLE 150 MG PO TABS: 150.0000 mg | ORAL_TABLET | Freq: Every day | ORAL | 0 refills | Status: DC

## 2017-08-22 ENCOUNTER — Encounter: Payer: Medicare HMO | Admitting: Internal Medicine

## 2017-09-12 ENCOUNTER — Encounter: Payer: Medicare HMO | Admitting: Internal Medicine

## 2017-10-08 ENCOUNTER — Other Ambulatory Visit: Payer: Self-pay | Admitting: Internal Medicine

## 2017-10-08 DIAGNOSIS — E782 Mixed hyperlipidemia: Secondary | ICD-10-CM

## 2017-10-08 DIAGNOSIS — G6289 Other specified polyneuropathies: Secondary | ICD-10-CM

## 2017-10-10 ENCOUNTER — Encounter: Payer: Medicare HMO | Admitting: Internal Medicine

## 2017-11-14 ENCOUNTER — Telehealth: Payer: Self-pay | Admitting: Internal Medicine

## 2017-12-05 ENCOUNTER — Encounter: Payer: Self-pay | Admitting: Internal Medicine

## 2017-12-05 ENCOUNTER — Other Ambulatory Visit: Payer: Self-pay

## 2017-12-05 ENCOUNTER — Ambulatory Visit (INDEPENDENT_AMBULATORY_CARE_PROVIDER_SITE_OTHER): Payer: Medicare HMO | Admitting: Internal Medicine

## 2017-12-05 VITALS — BP 150/87 | HR 98 | Temp 97.7°F | Ht 63.0 in | Wt 218.7 lb

## 2017-12-05 DIAGNOSIS — G4700 Insomnia, unspecified: Secondary | ICD-10-CM | POA: Diagnosis not present

## 2017-12-05 DIAGNOSIS — Z79899 Other long term (current) drug therapy: Secondary | ICD-10-CM

## 2017-12-05 DIAGNOSIS — N182 Chronic kidney disease, stage 2 (mild): Secondary | ICD-10-CM | POA: Diagnosis not present

## 2017-12-05 DIAGNOSIS — Z794 Long term (current) use of insulin: Secondary | ICD-10-CM

## 2017-12-05 DIAGNOSIS — E1142 Type 2 diabetes mellitus with diabetic polyneuropathy: Secondary | ICD-10-CM | POA: Diagnosis not present

## 2017-12-05 DIAGNOSIS — E1122 Type 2 diabetes mellitus with diabetic chronic kidney disease: Secondary | ICD-10-CM

## 2017-12-05 DIAGNOSIS — G6289 Other specified polyneuropathies: Secondary | ICD-10-CM

## 2017-12-05 LAB — GLUCOSE, CAPILLARY: Glucose-Capillary: 344 mg/dL — ABNORMAL HIGH (ref 65–99)

## 2017-12-05 LAB — POCT GLYCOSYLATED HEMOGLOBIN (HGB A1C): Hemoglobin A1C: 12.8

## 2017-12-05 MED ORDER — DULOXETINE HCL 30 MG PO CPEP: 30.0000 mg | ORAL_CAPSULE | Freq: Every day | ORAL | 3 refills | Status: DC

## 2017-12-05 NOTE — Patient Instructions (Signed)
I recommend increasing your metformin to 1,000mg  daily (take 2 of the current 500mg  pills.)  I recommend increasing your insulin to 35 units twice daily BEFORE meals. Please check your glucose regularly and if you are noticing or if you feel symptoms due to lows please go back to the old dose and let us know.  I have sent a new prescription for duloxetine 30mg  daily for diabetic neuropathy.  Please call us if you develop symptoms of a new yeast rash so I can call in antifungal medicine if monistat is inadequate.  We will need to see you back in clinic fairly soon to follow up these numerous changes. Please make an appointment for about 2 or 3 weeks from now.

## 2017-12-05 NOTE — Progress Notes (Signed)
CC: Follow up for diabetes  HPI:  Linda Miller is a 62 y.o. female with PMHx detailed below presenting for follow up her diabetes. She has had a stressful past 6 months or so after her mother died leading to multiple missed appointments, medications, and very irregualr sleep and diet habits. She felt her mood was depressed during part of this time but has improved now.  See problem based assessment and plan below for additional details.  Peripheral neuropathy (HCC) She seems to have increased neuropathy symptoms particularly on the right side. I am not sure why she thinks it is more on one side today but on exam it seems symmetric with the left. She has been out of duloxetine that she was taking previously for this and depression. She continues to take Elavil without interruption. Her diabetes is certainly not well controlled at this time which could be leading to worsening. 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 She reports that lately she is taking her medications every day with 30 units of 70/30 insulin twice and metformin 500mg  daily. However her meal times and sleep times vary greatly sometimes going to bed early in the evening and sometimes in the middle of the night. CBGs on her log are mostly in the 300s-400s with a few lows including down to 52. She noticed symptoms of tiredness and sweating when down to 52. Of note she is also takign her insulin after or sometimes between meals. A: Poorly controlled type 2 diabetes with Hgb A1c of 12.8% It will likely be very hard to control this well due to large day to day variations and very  inconsistent sleep and meal timings. However considering symptoms of hyperglycemia and overall high numbers I think increasing treatment is needed but with close folow up. P: Increase metformin to 1000mg  daily Increase insulin 70/30 to 35 units BID RTC in 2 weeks with glucometer to review progress on these changes   Past Medical History:  Diagnosis Date  . Anxiety   . CHF (congestive heart failure) (HCC)   . Diabetes mellitus   . GERD (gastroesophageal reflux disease) 03/29/2016  . High cholesterol   . Hypertension   . Neuropathy   . Osteomyelitis of right foot (HCC)     Review of Systems: Review of Systems  Constitutional: Positive for malaise/fatigue.  Eyes: Negative for blurred vision.  Cardiovascular: Negative for leg swelling.  Gastrointestinal: Negative for constipation and diarrhea.  Genitourinary: Positive for frequency.  Musculoskeletal: Negative for falls.  Skin: Negative for itching and rash.  Neurological: Negative for sensory change.  Endo/Heme/Allergies: Positive for polydipsia.  Psychiatric/Behavioral: The patient has insomnia.      Physical Exam: Vitals:   12/05/17 1542  BP: (!) 150/87  Pulse: 98  Temp: 97.7 F (36.5 C)  TempSrc: Oral  SpO2: 100%  Weight: 218 lb 11.2 oz (99.2 kg)  Height: 5\' 3"  (1.6 m)   GENERAL- alert, co-operative, NAD HEENT- Atraumatic, PERRL, EOMI, oral mucosa appears moist CARDIAC- RRR, no murmurs, rubs or gallops. RESP- CTAB, no wheezes or crackles. BACK- Normal curvature, no paraspinal tenderness, no CVA tenderness. NEURO- Reports R > L sensation changes in feet but light touch  sensation same on exam EXTREMITIES- Symmetric, no pedal edema. SKIN- Warm, dry, No rash or lesion. PSYCH- Normal mood and affect, appropriate thought content and speech.    Assessment & Plan:   See encounters tab for problem based medical decision making.   Patient discussed with Dr. Oswaldo DoneVincent

## 2017-12-08 NOTE — Assessment & Plan Note (Signed)
She seems to have increased neuropathy symptoms particularly on the right side. I am not sure why she thinks it is more on one side today but on exam it seems symmetric with the left. She has been out of duloxetine that she was taking previously for this and depression. She continues to take Elavil without interruption. Her diabetes is certainly not well controlled at this time which could be leading to worsening. A: Diabetic neuropathy, recently worse in the setting of uncontrolled diabetes also out of SNRI medication P: Restart duloxetine 30mg  daily Continue amitriptyline 100mg 

## 2017-12-08 NOTE — Assessment & Plan Note (Signed)
She was doing okay with current diabetes medications last year but stopped watching her diet very closely since last July or so after her mother died. She reports that lately she is taking her medications every day with 30 units of 70/30 insulin twice and metformin 500mg  daily. However her meal times and sleep times vary greatly sometimes going to bed early in the evening and sometimes in the middle of the night. CBGs on her log are mostly in the 300s-400s with a few lows including down to 52. She noticed symptoms of tiredness and sweating when down to 52. Of note she is also takign her insulin after or sometimes between meals. A: Poorly controlled type 2 diabetes with Hgb A1c of 12.8% It will likely be very hard to control this well due to large day to day variations and very inconsistent sleep and meal timings. However considering symptoms of hyperglycemia and overall high numbers I think increasing treatment is needed but with close folow up. P: Increase metformin to 1000mg  daily Increase insulin 70/30 to 35 units BID RTC in 2 weeks with glucometer to review progress on these changes

## 2017-12-09 NOTE — Progress Notes (Signed)
Internal Medicine Clinic Attending  Case discussed with Dr. Rice at the time of the visit.  We reviewed the resident's history and exam and pertinent patient test results.  I agree with the assessment, diagnosis, and plan of care documented in the resident's note.  

## 2017-12-26 ENCOUNTER — Telehealth: Payer: Self-pay | Admitting: Dietician

## 2017-12-29 NOTE — Telephone Encounter (Signed)
Patient was napping and asked for a call back later.

## 2018-01-30 ENCOUNTER — Other Ambulatory Visit: Payer: Self-pay | Admitting: Internal Medicine

## 2018-01-30 DIAGNOSIS — Z794 Long term (current) use of insulin: Principal | ICD-10-CM

## 2018-01-30 DIAGNOSIS — E1122 Type 2 diabetes mellitus with diabetic chronic kidney disease: Secondary | ICD-10-CM

## 2018-01-30 DIAGNOSIS — N182 Chronic kidney disease, stage 2 (mild): Principal | ICD-10-CM

## 2018-02-02 ENCOUNTER — Other Ambulatory Visit: Payer: Self-pay | Admitting: *Deleted

## 2018-02-02 DIAGNOSIS — G6289 Other specified polyneuropathies: Secondary | ICD-10-CM

## 2018-02-03 MED ORDER — AMITRIPTYLINE HCL 100 MG PO TABS: 100.0000 mg | ORAL_TABLET | Freq: Every day | ORAL | 1 refills | Status: AC

## 2018-02-03 MED ORDER — DULOXETINE HCL 30 MG PO CPEP: 30.0000 mg | ORAL_CAPSULE | Freq: Every day | ORAL | 3 refills | Status: AC

## 2018-02-06 ENCOUNTER — Encounter: Payer: Medicare HMO | Admitting: Internal Medicine

## 2018-02-07 ENCOUNTER — Other Ambulatory Visit: Payer: Self-pay | Admitting: Internal Medicine

## 2018-02-07 DIAGNOSIS — E782 Mixed hyperlipidemia: Secondary | ICD-10-CM

## 2018-03-04 DIAGNOSIS — R0602 Shortness of breath: Secondary | ICD-10-CM | POA: Diagnosis not present

## 2018-03-18 ENCOUNTER — Emergency Department (HOSPITAL_COMMUNITY)
Admission: EM | Admit: 2018-03-18 | Discharge: 2018-03-18 | Disposition: A | Discharge status: Home / Self Care | Payer: Medicare HMO | Attending: Emergency Medicine | Admitting: Emergency Medicine

## 2018-03-18 ENCOUNTER — Encounter (HOSPITAL_COMMUNITY): Payer: Self-pay | Admitting: Emergency Medicine

## 2018-03-18 DIAGNOSIS — R079 Chest pain, unspecified: Secondary | ICD-10-CM | POA: Diagnosis not present

## 2018-03-18 DIAGNOSIS — R0602 Shortness of breath: Secondary | ICD-10-CM | POA: Diagnosis not present

## 2018-03-18 DIAGNOSIS — Z5321 Procedure and treatment not carried out due to patient leaving prior to being seen by health care provider: Secondary | ICD-10-CM | POA: Insufficient documentation

## 2018-03-18 NOTE — ED Notes (Signed)
No reply for vitals x3. 

## 2018-03-18 NOTE — ED Triage Notes (Signed)
Per eMS:  Patient presents to ED for 1 week or worsening SOB, hx of CHF and COPD.  Patient 92% on RA upon EMS arrival, "barely any movement" reported from EMS.  Patient given 2 duonebs and 125 solumedrol en route.  Patient denies pain.  En route patient also mentioned hx of multiple falls due to right leg weakness and numbness intermittently x 2 months.  Denies at this time.

## 2018-03-18 NOTE — ED Notes (Signed)
Called Patient to reassess vitals x3 and had no response. 

## 2018-03-18 NOTE — ED Notes (Signed)
No reply for vitals x4. May be in imaging at this time.

## 2018-03-21 ENCOUNTER — Other Ambulatory Visit: Payer: Self-pay | Admitting: Internal Medicine

## 2018-03-21 DIAGNOSIS — E782 Mixed hyperlipidemia: Secondary | ICD-10-CM

## 2018-03-29 ENCOUNTER — Emergency Department (HOSPITAL_COMMUNITY): Payer: Medicare HMO

## 2018-03-29 ENCOUNTER — Encounter (HOSPITAL_COMMUNITY): Payer: Self-pay | Admitting: Emergency Medicine

## 2018-03-29 ENCOUNTER — Inpatient Hospital Stay (HOSPITAL_COMMUNITY): Payer: Medicare HMO

## 2018-03-29 ENCOUNTER — Inpatient Hospital Stay (HOSPITAL_COMMUNITY)
Admission: EM | Admit: 2018-03-29 | Discharge: 2018-04-25 | DRG: 208 | Disposition: E | Payer: Medicare HMO | Attending: Pulmonary Disease | Admitting: Pulmonary Disease

## 2018-03-29 DIAGNOSIS — Z6834 Body mass index (BMI) 34.0-34.9, adult: Secondary | ICD-10-CM

## 2018-03-29 DIAGNOSIS — I361 Nonrheumatic tricuspid (valve) insufficiency: Secondary | ICD-10-CM | POA: Diagnosis not present

## 2018-03-29 DIAGNOSIS — D6489 Other specified anemias: Secondary | ICD-10-CM | POA: Diagnosis present

## 2018-03-29 DIAGNOSIS — Z9289 Personal history of other medical treatment: Secondary | ICD-10-CM

## 2018-03-29 DIAGNOSIS — Z885 Allergy status to narcotic agent status: Secondary | ICD-10-CM

## 2018-03-29 DIAGNOSIS — N170 Acute kidney failure with tubular necrosis: Secondary | ICD-10-CM | POA: Diagnosis not present

## 2018-03-29 DIAGNOSIS — E876 Hypokalemia: Secondary | ICD-10-CM | POA: Diagnosis present

## 2018-03-29 DIAGNOSIS — K219 Gastro-esophageal reflux disease without esophagitis: Secondary | ICD-10-CM | POA: Diagnosis present

## 2018-03-29 DIAGNOSIS — I959 Hypotension, unspecified: Secondary | ICD-10-CM | POA: Diagnosis present

## 2018-03-29 DIAGNOSIS — I110 Hypertensive heart disease with heart failure: Secondary | ICD-10-CM | POA: Diagnosis present

## 2018-03-29 DIAGNOSIS — R4020 Unspecified coma: Secondary | ICD-10-CM | POA: Diagnosis present

## 2018-03-29 DIAGNOSIS — E232 Diabetes insipidus: Secondary | ICD-10-CM | POA: Diagnosis present

## 2018-03-29 DIAGNOSIS — E872 Acidosis: Secondary | ICD-10-CM | POA: Diagnosis present

## 2018-03-29 DIAGNOSIS — F1490 Cocaine use, unspecified, uncomplicated: Secondary | ICD-10-CM | POA: Diagnosis present

## 2018-03-29 DIAGNOSIS — N390 Urinary tract infection, site not specified: Secondary | ICD-10-CM | POA: Diagnosis present

## 2018-03-29 DIAGNOSIS — Z794 Long term (current) use of insulin: Secondary | ICD-10-CM

## 2018-03-29 DIAGNOSIS — Z888 Allergy status to other drugs, medicaments and biological substances status: Secondary | ICD-10-CM

## 2018-03-29 DIAGNOSIS — Z515 Encounter for palliative care: Secondary | ICD-10-CM | POA: Diagnosis not present

## 2018-03-29 DIAGNOSIS — E1140 Type 2 diabetes mellitus with diabetic neuropathy, unspecified: Secondary | ICD-10-CM | POA: Diagnosis present

## 2018-03-29 DIAGNOSIS — J9600 Acute respiratory failure, unspecified whether with hypoxia or hypercapnia: Secondary | ICD-10-CM | POA: Diagnosis not present

## 2018-03-29 DIAGNOSIS — I469 Cardiac arrest, cause unspecified: Secondary | ICD-10-CM | POA: Diagnosis present

## 2018-03-29 DIAGNOSIS — I5042 Chronic combined systolic (congestive) and diastolic (congestive) heart failure: Secondary | ICD-10-CM | POA: Diagnosis not present

## 2018-03-29 DIAGNOSIS — E785 Hyperlipidemia, unspecified: Secondary | ICD-10-CM | POA: Diagnosis present

## 2018-03-29 DIAGNOSIS — Z91018 Allergy to other foods: Secondary | ICD-10-CM

## 2018-03-29 DIAGNOSIS — I340 Nonrheumatic mitral (valve) insufficiency: Secondary | ICD-10-CM | POA: Diagnosis not present

## 2018-03-29 DIAGNOSIS — Z7982 Long term (current) use of aspirin: Secondary | ICD-10-CM

## 2018-03-29 DIAGNOSIS — F17210 Nicotine dependence, cigarettes, uncomplicated: Secondary | ICD-10-CM | POA: Diagnosis present

## 2018-03-29 DIAGNOSIS — R739 Hyperglycemia, unspecified: Secondary | ICD-10-CM | POA: Diagnosis not present

## 2018-03-29 DIAGNOSIS — J690 Pneumonitis due to inhalation of food and vomit: Secondary | ICD-10-CM | POA: Diagnosis not present

## 2018-03-29 DIAGNOSIS — J9690 Respiratory failure, unspecified, unspecified whether with hypoxia or hypercapnia: Secondary | ICD-10-CM

## 2018-03-29 DIAGNOSIS — E7800 Pure hypercholesterolemia, unspecified: Secondary | ICD-10-CM | POA: Diagnosis present

## 2018-03-29 DIAGNOSIS — I468 Cardiac arrest due to other underlying condition: Secondary | ICD-10-CM | POA: Diagnosis present

## 2018-03-29 DIAGNOSIS — Z79899 Other long term (current) drug therapy: Secondary | ICD-10-CM

## 2018-03-29 DIAGNOSIS — J9601 Acute respiratory failure with hypoxia: Secondary | ICD-10-CM | POA: Diagnosis present

## 2018-03-29 DIAGNOSIS — E1165 Type 2 diabetes mellitus with hyperglycemia: Secondary | ICD-10-CM | POA: Diagnosis not present

## 2018-03-29 DIAGNOSIS — Z66 Do not resuscitate: Secondary | ICD-10-CM | POA: Diagnosis not present

## 2018-03-29 DIAGNOSIS — Z91040 Latex allergy status: Secondary | ICD-10-CM

## 2018-03-29 DIAGNOSIS — B963 Hemophilus influenzae [H. influenzae] as the cause of diseases classified elsewhere: Secondary | ICD-10-CM | POA: Diagnosis present

## 2018-03-29 DIAGNOSIS — G931 Anoxic brain damage, not elsewhere classified: Secondary | ICD-10-CM | POA: Diagnosis not present

## 2018-03-29 DIAGNOSIS — Z833 Family history of diabetes mellitus: Secondary | ICD-10-CM

## 2018-03-29 DIAGNOSIS — F419 Anxiety disorder, unspecified: Secondary | ICD-10-CM | POA: Diagnosis present

## 2018-03-29 LAB — BASIC METABOLIC PANEL
Anion gap: 20 — ABNORMAL HIGH (ref 5–15)
BUN: 25 mg/dL — AB (ref 6–20)
CO2: 17 mmol/L — ABNORMAL LOW (ref 22–32)
CREATININE: 2.01 mg/dL — AB (ref 0.44–1.00)
Calcium: 8.1 mg/dL — ABNORMAL LOW (ref 8.9–10.3)
Chloride: 93 mmol/L — ABNORMAL LOW (ref 101–111)
GFR calc Af Amer: 30 mL/min — ABNORMAL LOW (ref >60)
GFR, EST NON AFRICAN AMERICAN: 26 mL/min — AB (ref >60)
Glucose, Bld: 613 mg/dL (ref 65–99)
Potassium: 5.2 mmol/L — ABNORMAL HIGH (ref 3.5–5.1)
SODIUM: 130 mmol/L — AB (ref 135–145)

## 2018-03-29 LAB — GLUCOSE, CAPILLARY
GLUCOSE-CAPILLARY: 475 mg/dL — AB (ref 65–99)
GLUCOSE-CAPILLARY: 503 mg/dL — AB (ref 65–99)
Glucose-Capillary: 524 mg/dL (ref 65–99)

## 2018-03-29 LAB — TROPONIN I
TROPONIN I: 0.05 ng/mL — AB (ref <0.03)
Troponin I: 0.09 ng/mL (ref <0.03)

## 2018-03-29 LAB — I-STAT CHEM 8, ED
BUN: 29 mg/dL — AB (ref 6–20)
CALCIUM ION: 1.02 mmol/L — AB (ref 1.15–1.40)
CHLORIDE: 99 mmol/L — AB (ref 101–111)
Creatinine, Ser: 1.5 mg/dL — ABNORMAL HIGH (ref 0.44–1.00)
Glucose, Bld: 564 mg/dL (ref 65–99)
HEMATOCRIT: 39 % (ref 36.0–46.0)
Hemoglobin: 13.3 g/dL (ref 12.0–15.0)
Potassium: 4.8 mmol/L (ref 3.5–5.1)
Sodium: 132 mmol/L — ABNORMAL LOW (ref 135–145)
TCO2: 20 mmol/L — ABNORMAL LOW (ref 22–32)

## 2018-03-29 LAB — ACETAMINOPHEN LEVEL

## 2018-03-29 LAB — URINALYSIS, ROUTINE W REFLEX MICROSCOPIC
Bilirubin Urine: NEGATIVE
Glucose, UA: >=500 mg/dL — AB
Ketones, ur: 5 mg/dL — AB
Nitrite: NEGATIVE
PROTEIN: 100 mg/dL — AB
SPECIFIC GRAVITY, URINE: 1.022 (ref 1.005–1.030)
WBC, UA: >50 WBC/hpf — ABNORMAL HIGH (ref 0–5)
pH: 5 (ref 5.0–8.0)

## 2018-03-29 LAB — RAPID URINE DRUG SCREEN, HOSP PERFORMED
AMPHETAMINES: NOT DETECTED
BENZODIAZEPINES: NOT DETECTED
Barbiturates: NOT DETECTED
Cocaine: POSITIVE — AB
OPIATES: NOT DETECTED
Tetrahydrocannabinol: NOT DETECTED

## 2018-03-29 LAB — COMPREHENSIVE METABOLIC PANEL
ALBUMIN: 2.9 g/dL — AB (ref 3.5–5.0)
ALT: 193 U/L — ABNORMAL HIGH (ref 14–54)
ANION GAP: 18 — AB (ref 5–15)
AST: 307 U/L — ABNORMAL HIGH (ref 15–41)
Alkaline Phosphatase: 99 U/L (ref 38–126)
BUN: 21 mg/dL — ABNORMAL HIGH (ref 6–20)
CHLORIDE: 96 mmol/L — AB (ref 101–111)
CO2: 19 mmol/L — AB (ref 22–32)
Calcium: 8.3 mg/dL — ABNORMAL LOW (ref 8.9–10.3)
Creatinine, Ser: 1.82 mg/dL — ABNORMAL HIGH (ref 0.44–1.00)
GFR calc Af Amer: 34 mL/min — ABNORMAL LOW (ref >60)
GFR calc non Af Amer: 29 mL/min — ABNORMAL LOW (ref >60)
GLUCOSE: 553 mg/dL — AB (ref 65–99)
POTASSIUM: 4.8 mmol/L (ref 3.5–5.1)
SODIUM: 133 mmol/L — AB (ref 135–145)
Total Bilirubin: 0.6 mg/dL (ref 0.3–1.2)
Total Protein: 6.9 g/dL (ref 6.5–8.1)

## 2018-03-29 LAB — PROTIME-INR
INR: 1.17
PROTHROMBIN TIME: 14.8 s (ref 11.4–15.2)

## 2018-03-29 LAB — CBC
HEMATOCRIT: 36.8 % (ref 36.0–46.0)
HEMOGLOBIN: 10.9 g/dL — AB (ref 12.0–15.0)
MCH: 23.5 pg — AB (ref 26.0–34.0)
MCHC: 29.6 g/dL — ABNORMAL LOW (ref 30.0–36.0)
MCV: 79.3 fL (ref 78.0–100.0)
Platelets: 324 10*3/uL (ref 150–400)
RBC: 4.64 MIL/uL (ref 3.87–5.11)
RDW: 16.4 % — ABNORMAL HIGH (ref 11.5–15.5)
WBC: 21.5 10*3/uL — ABNORMAL HIGH (ref 4.0–10.5)

## 2018-03-29 LAB — POCT I-STAT 3, ART BLOOD GAS (G3+)
ACID-BASE DEFICIT: 6 mmol/L — AB (ref 0.0–2.0)
Bicarbonate: 23.1 mmol/L (ref 20.0–28.0)
O2 SAT: 100 %
PH ART: 7.213 — AB (ref 7.350–7.450)
TCO2: 25 mmol/L (ref 22–32)
pCO2 arterial: 57 mmHg — ABNORMAL HIGH (ref 32.0–48.0)
pO2, Arterial: 557 mmHg — ABNORMAL HIGH (ref 83.0–108.0)

## 2018-03-29 LAB — BRAIN NATRIURETIC PEPTIDE: B Natriuretic Peptide: 362.8 pg/mL — ABNORMAL HIGH (ref 0.0–100.0)

## 2018-03-29 LAB — I-STAT TROPONIN, ED: Troponin i, poc: 0.06 ng/mL (ref 0.00–0.08)

## 2018-03-29 LAB — PROCALCITONIN: Procalcitonin: 0.19 ng/mL

## 2018-03-29 LAB — I-STAT CG4 LACTIC ACID, ED: Lactic Acid, Venous: 8.69 mmol/L (ref 0.5–1.9)

## 2018-03-29 LAB — SALICYLATE LEVEL

## 2018-03-29 LAB — MRSA PCR SCREENING: MRSA BY PCR: POSITIVE — AB

## 2018-03-29 LAB — APTT: aPTT: 25 seconds (ref 24–36)

## 2018-03-29 LAB — LACTIC ACID, PLASMA
LACTIC ACID, VENOUS: 1.3 mmol/L (ref 0.5–1.9)
Lactic Acid, Venous: 1.6 mmol/L (ref 0.5–1.9)

## 2018-03-29 MED ORDER — INSULIN REGULAR HUMAN 100 UNIT/ML IJ SOLN
INTRAMUSCULAR | Status: DC
  Administered 2018-03-29: 4.6 [IU]/h via INTRAVENOUS
  Administered 2018-03-31: 3.1 [IU]/h via INTRAVENOUS
  Administered 2018-03-31: 8.1 [IU]/h via INTRAVENOUS
  Administered 2018-04-01: 12 [IU]/h via INTRAVENOUS
  Administered 2018-04-01: 20 [IU]/h via INTRAVENOUS
  Filled 2018-03-29 (×6): qty 1

## 2018-03-29 MED ORDER — ONDANSETRON HCL 4 MG/2ML IJ SOLN: 4.0000 mg | Freq: Four times a day (QID) | INTRAMUSCULAR | Status: DC | PRN

## 2018-03-29 MED ORDER — SODIUM CHLORIDE 0.9% FLUSH: 10.0000 mL | INTRAVENOUS | Status: DC | PRN

## 2018-03-29 MED ORDER — SODIUM CHLORIDE 0.9 % IV BOLUS
1000.0000 mL | Freq: Once | INTRAVENOUS | Status: AC
  Administered 2018-03-29: 1000 mL via INTRAVENOUS

## 2018-03-29 MED ORDER — INSULIN ASPART 100 UNIT/ML ~~LOC~~ SOLN: 0.0000 [IU] | SUBCUTANEOUS | Status: DC

## 2018-03-29 MED ORDER — DOCUSATE SODIUM 50 MG/5ML PO LIQD
100.0000 mg | Freq: Two times a day (BID) | ORAL | Status: DC | PRN
  Administered 2018-03-31: 100 mg
  Filled 2018-03-29: qty 10

## 2018-03-29 MED ORDER — SODIUM CHLORIDE 0.9 % IV SOLN
250.0000 mL | INTRAVENOUS | Status: DC | PRN
  Administered 2018-03-29: 250 mL via INTRAVENOUS

## 2018-03-29 MED ORDER — SODIUM CHLORIDE 0.9 % IV BOLUS
500.0000 mL | Freq: Once | INTRAVENOUS | Status: AC
  Administered 2018-03-29: 500 mL via INTRAVENOUS

## 2018-03-29 MED ORDER — SODIUM CHLORIDE 0.9 % IV BOLUS: 1000.0000 mL | Freq: Once | INTRAVENOUS | Status: DC

## 2018-03-29 MED ORDER — HYDRALAZINE HCL 20 MG/ML IJ SOLN
10.0000 mg | INTRAMUSCULAR | Status: DC | PRN
  Administered 2018-03-29: 20 mg via INTRAVENOUS
  Filled 2018-03-29: qty 2

## 2018-03-29 MED ORDER — ACETAMINOPHEN 325 MG PO TABS: 650.0000 mg | ORAL_TABLET | Freq: Four times a day (QID) | ORAL | Status: DC | PRN

## 2018-03-29 MED ORDER — MIDAZOLAM HCL 2 MG/2ML IJ SOLN
2.0000 mg | INTRAMUSCULAR | Status: DC | PRN
  Administered 2018-03-29 – 2018-03-30 (×2): 2 mg via INTRAVENOUS
  Filled 2018-03-29: qty 2

## 2018-03-29 MED ORDER — CHLORHEXIDINE GLUCONATE 0.12% ORAL RINSE (MEDLINE KIT)
15.0000 mL | Freq: Two times a day (BID) | OROMUCOSAL | Status: DC
  Administered 2018-03-29 – 2018-04-01 (×7): 15 mL via OROMUCOSAL

## 2018-03-29 MED ORDER — FAMOTIDINE 40 MG/5ML PO SUSR
20.0000 mg | Freq: Two times a day (BID) | ORAL | Status: DC
  Administered 2018-03-29 – 2018-04-01 (×6): 20 mg
  Filled 2018-03-29 (×6): qty 2.5

## 2018-03-29 MED ORDER — SODIUM CHLORIDE 0.9 % IV SOLN
INTRAVENOUS | Status: DC
  Administered 2018-03-29 – 2018-03-31 (×2): via INTRAVENOUS
  Administered 2018-03-31: 250 mL via INTRAVENOUS

## 2018-03-29 MED ORDER — CHLORHEXIDINE GLUCONATE CLOTH 2 % EX PADS
6.0000 | MEDICATED_PAD | Freq: Every day | CUTANEOUS | Status: DC
  Administered 2018-03-29 – 2018-03-31 (×3): 6 via TOPICAL

## 2018-03-29 MED ORDER — SODIUM CHLORIDE 0.9 % IV SOLN
1.0000 g | Freq: Once | INTRAVENOUS | Status: AC
  Administered 2018-03-29: 1 g via INTRAVENOUS
  Filled 2018-03-29: qty 10

## 2018-03-29 MED ORDER — SODIUM CHLORIDE 0.9 % IV SOLN
3.0000 g | Freq: Three times a day (TID) | INTRAVENOUS | Status: DC
  Administered 2018-03-29 – 2018-04-01 (×9): 3 g via INTRAVENOUS
  Filled 2018-03-29 (×10): qty 3

## 2018-03-29 MED ORDER — ORAL CARE MOUTH RINSE
15.0000 mL | OROMUCOSAL | Status: DC
  Administered 2018-03-29 – 2018-04-01 (×29): 15 mL via OROMUCOSAL

## 2018-03-29 MED ORDER — SODIUM CHLORIDE 0.9 % IV SOLN: INTRAVENOUS | Status: DC

## 2018-03-29 MED ORDER — MIDAZOLAM HCL 2 MG/2ML IJ SOLN
INTRAMUSCULAR | Status: AC
  Filled 2018-03-29: qty 2

## 2018-03-29 MED ORDER — NOREPINEPHRINE BITARTRATE 1 MG/ML IV SOLN
0.0000 ug/min | INTRAVENOUS | Status: DC
  Administered 2018-03-29: 5 ug/min via INTRAVENOUS
  Filled 2018-03-29: qty 4

## 2018-03-29 MED ORDER — SODIUM CHLORIDE 0.9% FLUSH
10.0000 mL | Freq: Two times a day (BID) | INTRAVENOUS | Status: DC
  Administered 2018-03-29 – 2018-04-01 (×6): 10 mL

## 2018-03-29 MED ORDER — FENTANYL CITRATE (PF) 100 MCG/2ML IJ SOLN: 50.0000 ug | INTRAMUSCULAR | Status: DC | PRN

## 2018-03-29 MED ORDER — IPRATROPIUM-ALBUTEROL 0.5-2.5 (3) MG/3ML IN SOLN
3.0000 mL | Freq: Four times a day (QID) | RESPIRATORY_TRACT | Status: DC
  Administered 2018-03-29 – 2018-03-30 (×3): 3 mL via RESPIRATORY_TRACT
  Filled 2018-03-29 (×3): qty 3

## 2018-03-29 MED ORDER — ALBUTEROL SULFATE (2.5 MG/3ML) 0.083% IN NEBU: 2.5000 mg | INHALATION_SOLUTION | RESPIRATORY_TRACT | Status: DC | PRN

## 2018-03-29 NOTE — Progress Notes (Addendum)
eLink Physician-Brief Progress Note Patient Name: Linda Miller DOB: 25-May-1956 MRN: [id number]   Date of Service  04-24-2018  HPI/Events of Note  Hyperglycemia - Blood glucose = 613.   eICU Interventions  Will order: 1. D/C Novolog SSI. 2. Novolog Insuion IV infusion per Glucostabilizer protocol.      Intervention Category Major Interventions: Hyperglycemia - active titration of insulin therapy  Sommer,Steven Eugene 04-24-2018, 9:45 PM

## 2018-03-29 NOTE — Progress Notes (Signed)
   Apr 12, 2018 1642  Clinical Encounter Type  Visited With Patient;Family;Health care provider  Visit Type ED;Critical Care  Spiritual Encounters  Spiritual Needs Emotional   While in the ED this patient arrived CPR. EMT stated family at the scene and were on the way.  Nephew arrived, I was able to call one of the patient's sister.  A sister and her husband arrived and were briefed by the PA.  Escorted family to see the patient and showed them where she will be go once admitted.  Sister indicated their mother died 9 months ago and they have lost 2 brothers about 9 years ago.  Will follow and support as needed. Chaplain Agustin Cree

## 2018-03-29 NOTE — ED Provider Notes (Signed)
[facility] EMERGENCY DEPARTMENT Provider Note   CSN: [id number] Arrival date & time: 04/10/2018  1430     History   Chief Complaint Chief Complaint  Patient presents with  . post CPR    HPI Akaysha Cobern is a 62 y.o. female.  Patient s/p cardiorespiratory arrest, s/p cpr w rosc after approximately 15-20 minutes cpr, [facility] airway, presents via EMS.  Pt unresponsive - level 5 caveat.  Per ems report, pt found unresponsive by family, unclear when last seen by family or down time prior to cpr. Ems notes no motor activity, pt apneic, unresponsive.   The history is provided by the patient and the EMS personnel. The history is limited by the condition of the patient.    Past Medical History:  Diagnosis Date  . Anxiety   . CHF (congestive heart failure) ([place])   . Diabetes mellitus   . GERD (gastroesophageal reflux disease) 03/29/2016  . High cholesterol   . Hypertension   . Neuropathy   . Osteomyelitis of right foot [facility])     Patient Active Problem List   Diagnosis Date Noted  . Candidal intertrigo 12/22/2016  . Left foot pain 08/30/2016  . Constipation 05/09/2016  . GERD (gastroesophageal reflux disease) 03/29/2016  . Chronic combined systolic and diastolic CHF, NYHA class 2 ([place]) 01/03/2016  . Pre-ulcerative corn or callous 12/08/2015  . Diabetic retinopathy ([place]) 03/02/2015  . Poor dentition 09/03/2014  . Microcytic anemia 09/03/2014  . Peripheral neuropathy ([place]) 07/08/2014  . Health care maintenance 10/17/2013  . Hyperlipidemia 12/29/2012  . Type 2 diabetes mellitus with stage 2 chronic kidney disease ([place]) 09/22/2012  . Hypertension 09/22/2012    Past Surgical History:  Procedure Laterality Date  . ABDOMINAL HYSTERECTOMY    . CARDIAC CATHETERIZATION N/A 03/05/2016   Procedure: Left Heart Cath and Coronary Angiography;  Surgeon: Belva Crome, MD;  Location: [place] [facility];  Service: Cardiovascular;  Laterality: N/A;  . DEBRIDEMENT  FOOT    .  I&D EXTREMITY  09/23/2012   Procedure: IRRIGATION AND DEBRIDEMENT EXTREMITY;  Surgeon: Newt Minion, MD;  Location: [place];  Service: Orthopedics;  Laterality: Right;  . TEE WITHOUT CARDIOVERSION  09/24/2012   Procedure: TRANSESOPHAGEAL ECHOCARDIOGRAM (TEE);  Surgeon: Larey Dresser, MD;  Location: [facility];  Service: Cardiovascular;  Laterality: N/A;     OB History   None      Home Medications    Prior to Admission medications   Medication Sig Start Date End Date Taking? Authorizing Provider  ACCU-CHEK FASTCLIX LANCETS MISC Use to check blood sugar 3 to 4 times daily.Insulin requiring.  dx code E11.22 05/23/17   Collier Salina, MD  albuterol (PROVENTIL HFA;VENTOLIN HFA) 108 (90 Base) MCG/ACT inhaler Inhale 2 puffs into the lungs every 6 (six) hours as needed for wheezing or shortness of breath. 02/23/16   Skeet Latch, MD  Alcohol Swabs (B-D SINGLE USE SWABS REGULAR) PADS Apply 1 application topically QID. Use to check blood sugar 3 to 4 times daily.Insulin requiring.  dx code E11.22 06/26/17   Collier Salina, MD  amitriptyline (ELAVIL) 100 MG tablet Take 1 tablet (100 mg total) by mouth at bedtime. 02/03/18   Collier Salina, MD  aspirin 81 MG tablet Take 81 mg by mouth daily.    [provider]  atorvastatin (LIPITOR) 40 MG tablet TAKE 1 TABLET (40 MG TOTAL) BY MOUTH DAILY. 03/23/18 04/14/19  Collier Salina, MD  Blood Glucose Calibration (ACCU-CHEK AVIVA) SOLN  USE AS NEEDED AS DIRECTED FOR GLUCOMETER CALIBRATION 02/02/18   Rice, Resa Miner, MD  Blood Glucose Monitoring Suppl (ACCU-CHEK AVIVA PLUS) w/Device KIT Use two times daily. Diagnosis code E11.22 06/06/17   Collier Salina, MD  Blood Glucose Monitoring Suppl (ACCU-CHEK NANO SMARTVIEW) W/DEVICE KIT 1 each by Does not apply route 2 (two) times daily. 05/05/15   Juluis Mire, MD  DULoxetine (CYMBALTA) 30 MG capsule Take 1 capsule (30 mg total) by mouth daily. 02/03/18   Collier Salina, MD    fluconazole (DIFLUCAN) 150 MG tablet Take 1 tablet (150 mg total) by mouth daily. 08/19/17   Rice, Resa Miner, MD  glucose blood (ACCU-CHEK AVIVA PLUS) test strip Use to check blood sugar 3 to 4 times daily.Insulin requiring.  dx code E11.22 06/26/17   Collier Salina, MD  Insulin Pen Needle 33G X 5 MM MISC 1 Units by Does not apply route daily. 05/08/16   Milagros Loll, MD  Insulin Syringe-Needle U-100 31G X 15/64" 0.3 ML MISC 1 each by Does not apply route 2 (two) times daily. Use to inject insulin twice daily dx code E11.22. Insulin dependent 04/11/17   Rice, Resa Miner, MD  Lancet Devices [facility]) lancets Use to check blood sugar 3 to 4 times daily.Insulin requiring.  dx code E11.22 06/26/17   Collier Salina, MD  Lancets Misc. (ACCU-CHEK SOFTCLIX LANCET DEV) KIT USE UP TO 4 TIMES DAILY TO CHECK BLOOD SUGAR. DIAG CODE E11.22. INSULIN DEPENDENT 02/02/18   Rice, Resa Miner, MD  losartan (COZAAR) 50 MG tablet TAKE 1 TABLET (50 MG TOTAL) BY MOUTH DAILY. 08/11/17   Rice, Resa Miner, MD  metFORMIN (GLUCOPHAGE-XR) 500 MG 24 hr tablet TAKE 1 TABLET (500 MG TOTAL) BY MOUTH DAILY WITH BREAKFAST. 02/02/18   Rice, Resa Miner, MD  metoprolol tartrate (LOPRESSOR) 25 MG tablet Take 1 tablet (25 mg total) by mouth 2 (two) times daily. 06/06/17   Rice, Resa Miner, MD  NOVOLOG MIX 70/30 (70-30) 100 UNIT/ML injection INJECT 30 UNITS INTO THE SKIN 2 (TWO) TIMES DAILY WITH A MEAL. 08/11/17   Collier Salina, MD  pantoprazole (PROTONIX) 40 MG tablet TAKE 1 TABLET EVERY DAY 02/02/18   Rice, Resa Miner, MD    Family History Family History  Problem Relation Age of Onset  . Diabetes Mother   . Stroke Mother   . CAD Mother   . Cancer Father   . CAD Sister     Social History Social History   Tobacco Use  . Smoking status: Current Some Day Smoker    Packs/day: 0.20    Types: Cigarettes    Last attempt to quit: 11/07/2013    Years since quitting: 4.3  . Smokeless tobacco:  Former Systems developer    Quit date: 06/25/2014  . Tobacco comment: stopped at Christmas  Substance Use Topics  . Alcohol use: Yes    Alcohol/week: 0.0 oz  . Drug use: No     Allergies   Coconut fatty acids; Codeine; Lisinopril; Tramadol; Latex; and Lyrica [pregabalin]   Review of Systems Review of Systems  Unable to perform ROS: Patient unresponsive  level 5 caveat - pt unresponsive.    Physical Exam Updated Vital Signs BP 93/75 (BP Location: Right Arm)   Pulse 87   Temp (!) 97.1 F (36.2 C) (Temporal)   Resp 14   SpO2 100%   Physical Exam  Constitutional: She appears distressed.  Pt unresponsive, King airway, bag ventilation.   HENT:  Head:  Atraumatic.  Eyes: Conjunctivae are normal. No scleral icterus.  Pupils 3 mm, not reactive.   Neck: Neck supple. No tracheal deviation present.  Cardiovascular: Normal rate, regular rhythm, normal heart sounds and intact distal pulses. Exam reveals no gallop and no friction rub.  No murmur heard. Pulmonary/Chest: Effort normal and breath sounds normal. No respiratory distress.  Abdominal: Soft. Normal appearance and bowel sounds are normal. She exhibits no distension. There is no tenderness.  Genitourinary:  Genitourinary Comments: Normal external gu exam.   Musculoskeletal: She exhibits no edema.  IO line in lower extremity.   Neurological:  Patient unresponsive. Pupils not reactive. Absent corneal reflex. No motor response.   Skin: Skin is warm and dry. No rash noted.  Psychiatric:  Unresponsive.   Nursing note and vitals reviewed.    ED Treatments / Results  Labs (all labs ordered are listed, but only abnormal results are displayed) Results for orders placed or performed during the hospital encounter of 03/28/2018  Blood culture (routine x 2)  Result Value Ref Range   Specimen Description BLOOD LEFT HAND    Special Requests      BOTTLES DRAWN AEROBIC AND ANAEROBIC Blood Culture results may not be optimal due to an inadequate  volume of blood received in culture bottles   Culture      NO GROWTH < 24 HOURS Performed at [facility], [place] [address] [place], [place] [address]    Report Status PENDING   Blood culture (routine x 2)  Result Value Ref Range   Specimen Description BLOOD RIGHT HAND    Special Requests      BOTTLES DRAWN AEROBIC ONLY Blood Culture adequate volume   Culture      NO GROWTH < 24 HOURS Performed at [facility] [address] [place], [place] [address]    Report Status PENDING   Urine Culture  Result Value Ref Range   Specimen Description URINE, RANDOM    Special Requests      NONE Performed at [facility], [place] [address], [place], [place] [address]    Culture MULTIPLE SPECIES PRESENT, SUGGEST RECOLLECTION (A)    Report Status 03/30/2018 FINAL   Culture, respiratory (tracheal aspirate)  Result Value Ref Range   Specimen Description TRACHEAL ASPIRATE    Special Requests NONE    Gram Stain      MODERATE WBC PRESENT, PREDOMINANTLY PMN RARE SQUAMOUS EPITHELIAL CELLS PRESENT ABUNDANT GRAM NEGATIVE RODS FEW GRAM POSITIVE COCCI IN PAIRS    Culture      CULTURE REINCUBATED FOR BETTER GROWTH Performed at [facility], [place] [address] [place],  [address]    Report Status PENDING   MRSA PCR Screening  Result Value Ref Range   MRSA by PCR POSITIVE (A) NEGATIVE  Comprehensive metabolic panel  Result Value Ref Range   Sodium 133 (L) 135 - 145 mmol/L   Potassium 4.8 3.5 - 5.1 mmol/L   Chloride 96 (L) 101 - 111 mmol/L   CO2 19 (L) 22 - 32 mmol/L   Glucose, Bld 553 (HH) 65 - 99 mg/dL   BUN 21 (H) 6 - 20 mg/dL   Creatinine, Ser 1.82 (H) 0.44 - 1.00 mg/dL   Calcium 8.3 (L) 8.9 - 10.3 mg/dL   Total Protein 6.9 6.5 - 8.1 g/dL   Albumin 2.9 (L) 3.5 - 5.0 g/dL   AST 307 (H) 15 - 41 U/L   ALT 193 (H) 14 - 54 U/L  Alkaline Phosphatase 99 38 - 126 U/L   Total Bilirubin 0.6 0.3 - 1.2 mg/dL   GFR calc non Af Amer 29 (L) >60 mL/min   GFR calc Af Amer 34  (L) >60 mL/min   Anion gap 18 (H) 5 - 15  CBC  Result Value Ref Range   WBC 21.5 (H) 4.0 - 10.5 K/uL   RBC 4.64 3.87 - 5.11 MIL/uL   Hemoglobin 10.9 (L) 12.0 - 15.0 g/dL   HCT 36.8 36.0 - 46.0 %   MCV 79.3 78.0 - 100.0 fL   MCH 23.5 (L) 26.0 - 34.0 pg   MCHC 29.6 (L) 30.0 - 36.0 g/dL   RDW 16.4 (H) 11.5 - 15.5 %   Platelets 324 150 - 400 K/uL  Urinalysis, Routine w reflex microscopic  Result Value Ref Range   Color, Urine AMBER (A) YELLOW   APPearance TURBID (A) CLEAR   Specific Gravity, Urine 1.022 1.005 - 1.030   pH 5.0 5.0 - 8.0   Glucose, UA >=500 (A) NEGATIVE mg/dL   Hgb urine dipstick MODERATE (A) NEGATIVE   Bilirubin Urine NEGATIVE NEGATIVE   Ketones, ur 5 (A) NEGATIVE mg/dL   Protein, ur 100 (A) NEGATIVE mg/dL   Nitrite NEGATIVE NEGATIVE   Leukocytes, UA MODERATE (A) NEGATIVE   RBC / HPF 21-50 0 - 5 RBC/hpf   WBC, UA >50 (H) 0 - 5 WBC/hpf   Bacteria, UA FEW (A) NONE SEEN   Squamous Epithelial / LPF 0-5 0 - 5   WBC Clumps PRESENT    Mucus PRESENT    Hyaline Casts, UA PRESENT   Rapid urine drug screen (hospital performed)  Result Value Ref Range   Opiates NONE DETECTED NONE DETECTED   Cocaine POSITIVE (A) NONE DETECTED   Benzodiazepines NONE DETECTED NONE DETECTED   Amphetamines NONE DETECTED NONE DETECTED   Tetrahydrocannabinol NONE DETECTED NONE DETECTED   Barbiturates NONE DETECTED NONE DETECTED  Brain natriuretic peptide  Result Value Ref Range   B Natriuretic Peptide 362.8 (H) 0.0 - 100.0 pg/mL  Acetaminophen level  Result Value Ref Range   Acetaminophen (Tylenol), Serum <10 (L) 10 - 30 ug/mL  Salicylate level  Result Value Ref Range   Salicylate Lvl <2.5 (L) 2.8 - 30.0 mg/dL  Troponin I  Result Value Ref Range   Troponin I 0.05 (HH) <0.03 ng/mL  Troponin I  Result Value Ref Range   Troponin I 0.09 (HH) <0.03 ng/mL  Troponin I  Result Value Ref Range   Troponin I 0.06 (HH) <0.03 ng/mL  Lactic acid, plasma  Result Value Ref Range   Lactic Acid,  Venous 1.6 0.5 - 1.9 mmol/L  Lactic acid, plasma  Result Value Ref Range   Lactic Acid, Venous 1.3 0.5 - 1.9 mmol/L  Procalcitonin  Result Value Ref Range   Procalcitonin 0.19 ng/mL  Basic metabolic panel  Result Value Ref Range   Sodium 147 (H) 135 - 145 mmol/L   Potassium 3.6 3.5 - 5.1 mmol/L   Chloride 108 101 - 111 mmol/L   CO2 27 22 - 32 mmol/L   Glucose, Bld 148 (H) 65 - 99 mg/dL   BUN 20 6 - 20 mg/dL   Creatinine, Ser 1.56 (H) 0.44 - 1.00 mg/dL   Calcium 8.9 8.9 - 10.3 mg/dL   GFR calc non Af Amer 35 (L) >60 mL/min   GFR calc Af Amer 41 (L) >60 mL/min   Anion gap 12 5 - 15  Protime-INR now  Result Value  Ref Range   Prothrombin Time 14.8 11.4 - 15.2 seconds   INR 1.17   APTT  Result Value Ref Range   aPTT 25 24 - 36 seconds  Blood gas, arterial  Result Value Ref Range   FIO2 40.00    Delivery systems VENTILATOR    Mode PRESSURE REGULATED VOLUME CONTROL    VT 460 mL   LHR 22 resp/min   Peep/cpap 5.0 cm H20   pH, Arterial 7.478 (H) 7.350 - 7.450   pCO2 arterial 34.1 32.0 - 48.0 mmHg   pO2, Arterial 119 (H) 83.0 - 108.0 mmHg   Bicarbonate 25.0 20.0 - 28.0 mmol/L   Acid-Base Excess 1.7 0.0 - 2.0 mmol/L   O2 Saturation 98.8 %   Patient temperature 98.6    Collection site RIGHT RADIAL    Drawn by [id number]    Sample type ARTERIAL DRAW    Allens test (pass/fail) PASS PASS  Magnesium  Result Value Ref Range   Magnesium 2.7 (H) 1.7 - 2.4 mg/dL  Phosphorus  Result Value Ref Range   Phosphorus 2.0 (L) 2.5 - 4.6 mg/dL  Basic metabolic panel  Result Value Ref Range   Sodium 130 (L) 135 - 145 mmol/L   Potassium 5.2 (H) 3.5 - 5.1 mmol/L   Chloride 93 (L) 101 - 111 mmol/L   CO2 17 (L) 22 - 32 mmol/L   Glucose, Bld 613 (HH) 65 - 99 mg/dL   BUN 25 (H) 6 - 20 mg/dL   Creatinine, Ser 2.01 (H) 0.44 - 1.00 mg/dL   Calcium 8.1 (L) 8.9 - 10.3 mg/dL   GFR calc non Af Amer 26 (L) >60 mL/min   GFR calc Af Amer 30 (L) >60 mL/min   Anion gap 20 (H) 5 - 15  Glucose, capillary    Result Value Ref Range   Glucose-Capillary 568 (HH) 65 - 99 mg/dL   Comment 1 Venous Specimen    Comment 2 Notify RN   Glucose, capillary  Result Value Ref Range   Glucose-Capillary 475 (H) 65 - 99 mg/dL   Comment 1 Venous Specimen    Comment 2 Notify RN   Glucose, capillary  Result Value Ref Range   Glucose-Capillary 524 (HH) 65 - 99 mg/dL  Glucose, capillary  Result Value Ref Range   Glucose-Capillary 503 (HH) 65 - 99 mg/dL   Comment 1 Venous Specimen    Comment 2 Notify RN    Comment 3 Glucose Stabilizer   Glucose, capillary  Result Value Ref Range   Glucose-Capillary 396 (H) 65 - 99 mg/dL   Comment 1 Venous Specimen    Comment 2 Notify RN    Comment 3 Glucose Stabilizer   Glucose, capillary  Result Value Ref Range   Glucose-Capillary 340 (H) 65 - 99 mg/dL   Comment 1 Venous Specimen    Comment 2 Notify RN    Comment 3 Glucose Stabilizer   Glucose, capillary  Result Value Ref Range   Glucose-Capillary 244 (H) 65 - 99 mg/dL   Comment 1 Venous Specimen    Comment 2 Notify RN    Comment 3 Glucose Stabilizer   Glucose, capillary  Result Value Ref Range   Glucose-Capillary 170 (H) 65 - 99 mg/dL   Comment 1 Venous Specimen    Comment 2 Notify RN    Comment 3 Glucose Stabilizer   Glucose, capillary  Result Value Ref Range   Glucose-Capillary 144 (H) 65 - 99 mg/dL   Comment 1 Venous Specimen  Comment 2 Notify RN    Comment 3 Glucose Stabilizer   Glucose, capillary  Result Value Ref Range   Glucose-Capillary 138 (H) 65 - 99 mg/dL   Comment 1 Venous Specimen    Comment 2 Notify RN    Comment 3 Glucose Stabilizer   Glucose, capillary  Result Value Ref Range   Glucose-Capillary 145 (H) 65 - 99 mg/dL   Comment 1 Venous Specimen    Comment 2 Notify RN    Comment 3 Glucose Stabilizer   Glucose, capillary  Result Value Ref Range   Glucose-Capillary 152 (H) 65 - 99 mg/dL   Comment 1 Venous Specimen    Comment 2 Notify RN    Comment 3 Glucose Stabilizer    Glucose, capillary  Result Value Ref Range   Glucose-Capillary 150 (H) 65 - 99 mg/dL  Glucose, capillary  Result Value Ref Range   Glucose-Capillary 147 (H) 65 - 99 mg/dL  Magnesium  Result Value Ref Range   Magnesium 2.6 (H) 1.7 - 2.4 mg/dL  Magnesium  Result Value Ref Range   Magnesium 2.4 1.7 - 2.4 mg/dL  Phosphorus  Result Value Ref Range   Phosphorus 1.5 (L) 2.5 - 4.6 mg/dL  Phosphorus  Result Value Ref Range   Phosphorus 1.5 (L) 2.5 - 4.6 mg/dL  Glucose, capillary  Result Value Ref Range   Glucose-Capillary 146 (H) 65 - 99 mg/dL   Comment 1 Venous Specimen   CBC  Result Value Ref Range   WBC 21.5 (H) 4.0 - 10.5 K/uL   RBC 4.79 3.87 - 5.11 MIL/uL   Hemoglobin 11.3 (L) 12.0 - 15.0 g/dL   HCT 36.1 36.0 - 46.0 %   MCV 75.4 (L) 78.0 - 100.0 fL   MCH 23.6 (L) 26.0 - 34.0 pg   MCHC 31.3 30.0 - 36.0 g/dL   RDW 16.3 (H) 11.5 - 15.5 %   Platelets 298 150 - 400 K/uL  Glucose, capillary  Result Value Ref Range   Glucose-Capillary 205 (H) 65 - 99 mg/dL   Comment 1 Capillary Specimen    Comment 2 Notify RN   Glucose, capillary  Result Value Ref Range   Glucose-Capillary 148 (H) 65 - 99 mg/dL  Glucose, capillary  Result Value Ref Range   Glucose-Capillary 183 (H) 65 - 99 mg/dL  Magnesium  Result Value Ref Range   Magnesium 2.3 1.7 - 2.4 mg/dL  Phosphorus  Result Value Ref Range   Phosphorus 1.9 (L) 2.5 - 4.6 mg/dL  Comprehensive metabolic panel  Result Value Ref Range   Sodium 151 (H) 135 - 145 mmol/L   Potassium 2.8 (L) 3.5 - 5.1 mmol/L   Chloride 117 (H) 101 - 111 mmol/L   CO2 26 22 - 32 mmol/L   Glucose, Bld 223 (H) 65 - 99 mg/dL   BUN 27 (H) 6 - 20 mg/dL   Creatinine, Ser 1.99 (H) 0.44 - 1.00 mg/dL   Calcium 8.7 (L) 8.9 - 10.3 mg/dL   Total Protein 6.1 (L) 6.5 - 8.1 g/dL   Albumin 2.4 (L) 3.5 - 5.0 g/dL   AST 63 (H) 15 - 41 U/L   ALT 95 (H) 14 - 54 U/L   Alkaline Phosphatase 88 38 - 126 U/L   Total Bilirubin 0.6 0.3 - 1.2 mg/dL   GFR calc non Af Amer 26  (L) >60 mL/min   GFR calc Af Amer 30 (L) >60 mL/min   Anion gap 8 5 - 15  CBC  Result Value  Ref Range   WBC 22.6 (H) 4.0 - 10.5 K/uL   RBC 4.43 3.87 - 5.11 MIL/uL   Hemoglobin 10.3 (L) 12.0 - 15.0 g/dL   HCT 33.8 (L) 36.0 - 46.0 %   MCV 76.3 (L) 78.0 - 100.0 fL   MCH 23.3 (L) 26.0 - 34.0 pg   MCHC 30.5 30.0 - 36.0 g/dL   RDW 16.3 (H) 11.5 - 15.5 %   Platelets 234 150 - 400 K/uL  Glucose, capillary  Result Value Ref Range   Glucose-Capillary 214 (H) 65 - 99 mg/dL   Comment 1 Notify RN   Glucose, capillary  Result Value Ref Range   Glucose-Capillary 263 (H) 65 - 99 mg/dL  Glucose, capillary  Result Value Ref Range   Glucose-Capillary 227 (H) 65 - 99 mg/dL  Glucose, capillary  Result Value Ref Range   Glucose-Capillary 242 (H) 65 - 99 mg/dL   Comment 1 Capillary Specimen   Glucose, capillary  Result Value Ref Range   Glucose-Capillary 183 (H) 65 - 99 mg/dL   Comment 1 Capillary Specimen   Glucose, capillary  Result Value Ref Range   Glucose-Capillary 193 (H) 65 - 99 mg/dL  Glucose, capillary  Result Value Ref Range   Glucose-Capillary 197 (H) 65 - 99 mg/dL   Comment 1 Capillary Specimen   Glucose, capillary  Result Value Ref Range   Glucose-Capillary 158 (H) 65 - 99 mg/dL  Glucose, capillary  Result Value Ref Range   Glucose-Capillary 138 (H) 65 - 99 mg/dL   Comment 1 Venous Specimen   Glucose, capillary  Result Value Ref Range   Glucose-Capillary 165 (H) 65 - 99 mg/dL   Comment 1 Venous Specimen   Glucose, capillary  Result Value Ref Range   Glucose-Capillary 184 (H) 65 - 99 mg/dL   Comment 1 Venous Specimen   Glucose, capillary  Result Value Ref Range   Glucose-Capillary 179 (H) 65 - 99 mg/dL   Comment 1 Capillary Specimen    Comment 2 Notify RN   I-stat chem 8, ed  Result Value Ref Range   Sodium 132 (L) 135 - 145 mmol/L   Potassium 4.8 3.5 - 5.1 mmol/L   Chloride 99 (L) 101 - 111 mmol/L   BUN 29 (H) 6 - 20 mg/dL   Creatinine, Ser 1.50 (H) 0.44 - 1.00  mg/dL   Glucose, Bld 564 (HH) 65 - 99 mg/dL   Calcium, Ion 1.02 (L) 1.15 - 1.40 mmol/L   TCO2 20 (L) 22 - 32 mmol/L   Hemoglobin 13.3 12.0 - 15.0 g/dL   HCT 39.0 36.0 - 46.0 %   Comment NOTIFIED PHYSICIAN   I-stat troponin, ED  Result Value Ref Range   Troponin i, poc 0.06 0.00 - 0.08 ng/mL   Comment 3          I-Stat CG4 Lactic Acid, ED  Result Value Ref Range   Lactic Acid, Venous 8.69 (HH) 0.5 - 1.9 mmol/L   Comment NOTIFIED PHYSICIAN   I-STAT 3, arterial blood gas (G3+)  Result Value Ref Range   pH, Arterial 7.213 (L) 7.350 - 7.450   pCO2 arterial 57.0 (H) 32.0 - 48.0 mmHg   pO2, Arterial 557.0 (H) 83.0 - 108.0 mmHg   Bicarbonate 23.1 20.0 - 28.0 mmol/L   TCO2 25 22 - 32 mmol/L   O2 Saturation 100.0 %   Acid-base deficit 6.0 (H) 0.0 - 2.0 mmol/L   Patient temperature 36.6 C    Collection site RADIAL, ALLEN'S TEST  ACCEPTABLE    Drawn by Operator    Sample type ARTERIAL   ECHOCARDIOGRAM COMPLETE  Result Value Ref Range   Weight 3,463.87 oz   Height 68 in   BP 93/60 mmHg   Ct Head Wo Contrast  Result Date: 04/08/2018 CLINICAL DATA:  Found unresponsive and pulseless.  Post CPR. EXAM: CT HEAD WITHOUT CONTRAST TECHNIQUE: Contiguous axial images were obtained from the base of the skull through the vertex without intravenous contrast. COMPARISON:  12/22/2006. FINDINGS: Brain: When compared with 12/22/2006, there appears to be slight blurring of the gray-white interface. Associated hyper attenuation of the vascular structures (i.e., pseudo subarachnoid hemorrhage appearance). Ventricles appears small. Otherwise, no evidence of a mass lesion or hydrocephalus. Vascular: No hyperdense vessel or unexpected calcification. Skull: Normal. Negative for fracture or focal lesion. Sinuses/Orbits: Opacification of the ethmoid air cells. Mucosal thickening in the maxillary sinuses. Mastoid air cells are clear. Other: None. 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 Electronically Signed   By: Lorin Picket M.D.   On: 03/28/2018 16:37   Dg Chest Port 1 View  Result Date: 03/31/2018 CLINICAL DATA:  Endotracheal tube present, respiratory failure EXAM: PORTABLE CHEST 1 VIEW COMPARISON:  Portable chest x-ray of 03/30/2017 FINDINGS: The tip of the endotracheal tube is only 5 mm above the carina and could be withdrawn 2-3 cm. Cardiomegaly is stable and there appears to be mild pulmonary vascular congestion present. NG tube extends below the hemidiaphragm. Left IJ central venous line tip overlies the expected right atrium. IMPRESSION: 1. Tip of endotracheal tube only 5 mm above the carina. Recommend withdrawing by 2-3 cm. 2. Cardiomegaly. Question of pulmonary vascular congestion now present. Electronically Signed   By: Ivar Drape M.D.   On: 03/31/2018 09:58   Dg Chest Port 1 View  Result Date: 03/30/2018 CLINICAL DATA:  Respiratory failure. EXAM: PORTABLE CHEST 1 VIEW COMPARISON:  04/03/2018 FINDINGS: Endotracheal tube terminates 8 mm above the carina. Withdrawal with approximately 2 cm is recommended. Enteric catheter descends below the left diaphragm. Left internal jugular approach central venous catheter terminates at the cavoatrial junction. Shock pads overlie the thorax. The cardiac silhouette is enlarged. Calcific atherosclerotic disease and tortuosity of the aorta. There is no evidence of focal airspace consolidation, pleural effusion or pneumothorax. Low lung volume with mildly increased interstitial markings. Osseous structures are without acute abnormality. Soft tissues are grossly normal. IMPRESSION: Low lung volume with mildly increased interstitial markings. No evidence of pneumothorax. Endotracheal tube terminates 8 mm above the carina, with the role with approximately 2 cm is recommended. Electronically Signed   By: Fidela Salisbury M.D.   On: 03/30/2018 09:29   Dg  Chest Port 1 View  Result Date: 03/28/2018 CLINICAL DATA:  Respiratory failure EXAM: PORTABLE CHEST 1 VIEW COMPARISON:  02/28/2016 chest radiograph. FINDINGS: Endotracheal tube tip is 2.3 cm above the carina. Enteric tube loops in the proximal stomach with the tip in the lower thoracic esophagus. Stable cardiomediastinal silhouette with mild cardiomegaly. No pneumothorax. No pleural effusion. Borderline mild pulmonary edema. Mild left basilar atelectasis. IMPRESSION: 1. Well-positioned endotracheal tube. 2. Enteric tube loops in the proximal stomach with the tip in the lower thoracic esophagus. 3. Borderline mild congestive heart failure. 4. Mild left basilar atelectasis. These results were called by telephone at the time of interpretation on 03/31/2018 at 3:21 pm to Dr. Lajean Saver , who verbally acknowledged these results. Electronically Signed   By:  Ilona Sorrel M.D.   On: 04/20/2018 15:22    EKG EKG Interpretation  Date/Time:  Sunday Mar 29 2018 14:33:43 EDT Ventricular Rate:  92 PR Interval:    QRS Duration: 190 QT Interval:  544 QTC Calculation: 674 R Axis:   -72 Text Interpretation:  Sinus rhythm RBBB and [facility] Non-specific ST-t changes Confirmed by Lajean Saver [phone]) on 04/05/2018 2:55:16 PM   Radiology Ct Head Wo Contrast  Result Date: 04/02/2018 CLINICAL DATA:  Found unresponsive and pulseless.  Post CPR. EXAM: CT HEAD WITHOUT CONTRAST TECHNIQUE: Contiguous axial images were obtained from the base of the skull through the vertex without intravenous contrast. COMPARISON:  12/22/2006. FINDINGS: Brain: When compared with 12/22/2006, there appears to be slight blurring of the gray-white interface. Associated hyper attenuation of the vascular structures (i.e., pseudo subarachnoid hemorrhage appearance). Ventricles appears small. Otherwise, no evidence of a mass lesion or hydrocephalus. Vascular: No hyperdense vessel or unexpected calcification. Skull: Normal. Negative for fracture or focal  lesion. Sinuses/Orbits: Opacification of the ethmoid air cells. Mucosal thickening in the maxillary sinuses. Mastoid air cells are clear. Other: None. 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 Electronically Signed   By: Lorin Picket M.D.   On: 04/18/2018 16:37   Dg Chest Port 1 View  Result Date: 03/31/2018 CLINICAL DATA:  Endotracheal tube present, respiratory failure EXAM: PORTABLE CHEST 1 VIEW COMPARISON:  Portable chest x-ray of 03/30/2017 FINDINGS: The tip of the endotracheal tube is only 5 mm above the carina and could be withdrawn 2-3 cm. Cardiomegaly is stable and there appears to be mild pulmonary vascular congestion present. NG tube extends below the hemidiaphragm. Left IJ central venous line tip overlies the expected right atrium. IMPRESSION: 1. Tip of endotracheal tube only 5 mm above the carina. Recommend withdrawing by 2-3 cm. 2. Cardiomegaly. Question of pulmonary vascular congestion now present. Electronically Signed   By: Ivar Drape M.D.   On: 03/31/2018 09:58   Dg Chest Port 1 View  Result Date: 03/30/2018 CLINICAL DATA:  Respiratory failure. EXAM: PORTABLE CHEST 1 VIEW COMPARISON:  04/03/2018 FINDINGS: Endotracheal tube terminates 8 mm above the carina. Withdrawal with approximately 2 cm is recommended. Enteric catheter descends below the left diaphragm. Left internal jugular approach central venous catheter terminates at the cavoatrial junction. Shock pads overlie the thorax. The cardiac silhouette is enlarged. Calcific atherosclerotic disease and tortuosity of the aorta. There is no evidence of focal airspace consolidation, pleural effusion or pneumothorax. Low lung volume with mildly increased interstitial markings. Osseous structures are without acute abnormality. Soft tissues are grossly normal. IMPRESSION: Low lung volume with mildly increased interstitial  markings. No evidence of pneumothorax. Endotracheal tube terminates 8 mm above the carina, with the role with approximately 2 cm is recommended. Electronically Signed   By: Fidela Salisbury M.D.   On: 03/30/2018 09:29   Dg Chest Port 1 View  Result Date: 04/24/2018 CLINICAL DATA:  Respiratory failure EXAM: PORTABLE CHEST 1 VIEW COMPARISON:  02/28/2016 chest radiograph. FINDINGS: Endotracheal tube tip is 2.3 cm above the carina. Enteric tube loops in the proximal stomach with the tip in the lower thoracic esophagus. Stable cardiomediastinal silhouette with mild cardiomegaly. No pneumothorax. No pleural effusion. Borderline mild pulmonary edema. Mild left basilar atelectasis. IMPRESSION: 1. Well-positioned endotracheal tube. 2. Enteric tube loops in the proximal stomach with the tip in the lower thoracic esophagus. 3. Borderline mild congestive heart failure. 4. Mild left basilar  atelectasis. These results were called by telephone at the time of interpretation on 04/04/2018 at 3:21 pm to Dr. Lajean Saver , who verbally acknowledged these results. Electronically Signed   By: Ilona Sorrel M.D.   On: 04/14/2018 15:22    Procedures Procedure Name: Intubation Date/Time: 03/31/2018 1:46 PM Performed by: Lajean Saver, MD Pre-anesthesia Checklist: Patient identified, Patient being monitored, Emergency Drugs available, Timeout performed and Suction available Oxygen Delivery Method: Non-rebreather mask Preoxygenation: Pre-oxygenation with 100% oxygen Induction Type: Rapid sequence Ventilation: Mask ventilation without difficulty Laryngoscope Size: Glidescope Tube size: 7.5 mm Number of attempts: 1 Placement Confirmation: ETT inserted through vocal cords under direct vision,  CO2 detector and Breath sounds checked- equal and bilateral      (including critical care time)  Medications Ordered in ED Medications  0.9 %  sodium chloride infusion (has no administration in time range)  sodium chloride 0.9 %  bolus 500 mL (has no administration in time range)     Initial Impression / Assessment and Plan / ED Course  I have reviewed the triage vital signs and the nursing notes.  Pertinent labs & imaging results that were available during my care of the patient were reviewed by me and considered in my medical decision making (see chart for details).  Iv ns. Continuous pulse ox and monitor. Stat labs.   King airway changed over to definitive ETT.   co2 color change and bil bs confirmed. Stat pcxr.   Reviewed nursing notes and prior charts for additional history.   Glucose very high. Ns bolus. Novolog. Awaiting additional labs.  Discussed with critical care physician - he will see in ED/admit.  Discussed w cardiology - pt with cath without significant cad on prior ct - cardiology on call indicates appears nothing acute for them to do, that critical care can re-consult if need.   Multiple rechecks of pt, bil bs. No change in neuro exam from prior.   CRITICAL CARE  RE acute respiratory failure, cpr, suspect anoxic brain injury,  Intubation/mechanical ventilation Performed by: Mirna Mires Total critical care time: 40 minutes Critical care time was exclusive of separately billable procedures and treating other patients. Critical care was necessary to treat or prevent imminent or life-threatening deterioration. Critical care was time spent personally by me on the following activities: development of treatment plan with patient and/or surrogate as well as nursing, discussions with consultants, evaluation of patient's response to treatment, examination of patient, obtaining history from patient or surrogate, ordering and performing treatments and interventions, ordering and review of laboratory studies, ordering and review of radiographic studies, pulse oximetry and re-evaluation of patient's condition.     Final Clinical Impressions(s) / ED Diagnoses   Final diagnoses:  None    ED  Discharge Orders    None       Lajean Saver, MD 03/31/18 1348

## 2018-03-29 NOTE — Progress Notes (Signed)
Transported to CT via ventilator. No complications noted.

## 2018-03-29 NOTE — ED Triage Notes (Signed)
Patient arrived by [facility] following CPR. Patient seen normal at 1330 per family and found at 1347 unresponsive and pulseless. CPR started-and received EPI x 3 and king airway placed. CBG 500s. Patient had episode in route of v-tach and cardioverted. Went into NSR and became pulseless again at 1412. CPR restarted and ROSC at 1423 , just prior to EMS arrival in ED.

## 2018-03-29 NOTE — H&P (Signed)
PULMONARY / CRITICAL CARE MEDICINE   Name: Linda Miller MRN: [id number] DOB: November 11, 1956    ADMISSION DATE:  04/11/2018 CONSULTATION DATE:  04/15/2018  REFERRING MD:  Dr. Benjamine Mola / EDP   CHIEF COMPLAINT:  Altered mental status   HISTORY OF PRESENT ILLNESS:   62 y/o F who presented to [facility] on 5/5 after being found down at home.    Per family, she was reportedly seen approximately 15 minutes prior to being found down at home.  EMS was activated and she was found unresponsive and pulseless.  She required CPR with EPIx3.  King airway placed.  In route, she had an episode of VT and was shocked, went into SR then was pulseless.  ROSC achieved 1423. In ER, she was unresponsive.  Airway changed to ETT.  I-STAT labs - NA 132, K 4.8, CL 99, CO2 20, glucose 564, BUN 29, sr cr 1.50, lactic acid 8.69, WBC 21.5, Hgb 10.9 and platelets 324.  CXR demonstrated ETT in good position, mild vascular congestion and left basilar atelectasis. Per RN, the patient has not had any sedation and has not moved or responded to intubation / pain.   PCCM consulted for ICU admission.   Upon further questioning of family, the patient had been complaining of shortness of breath for several weeks.  She felt as though she had been having issues with allergies.  Family state she normally gets up and fixes breakfast then goes back to bed.  Her nephew noted her to be laying on her back which she normally does not do. He went back later (he thinks 10 minutes) to check on her and she was still in the same position.    PAST MEDICAL HISTORY :  She  has a past medical history of Anxiety, CHF (congestive heart failure) ([place]), Diabetes mellitus, GERD (gastroesophageal reflux disease) (03/29/2016), High cholesterol, Hypertension, Neuropathy, and Osteomyelitis of right foot ([place]).  PAST SURGICAL HISTORY: She  has a past surgical history that includes Abdominal hysterectomy; Debridement  foot; I&D extremity (09/23/2012); TEE without cardioversion  (09/24/2012); and Cardiac catheterization (N/A, 03/05/2016).  Allergies  Allergen Reactions  . Coconut Fatty Acids Itching and Swelling  . Codeine Itching and Swelling    Throat  . Lisinopril Cough  . Tramadol Hives and Itching  . Latex Itching and Rash  . Lyrica [Pregabalin] Itching and Rash    No current facility-administered medications on file prior to encounter.    Current Outpatient Medications on File Prior to Encounter  Medication Sig  . ACCU-CHEK FASTCLIX LANCETS MISC Use to check blood sugar 3 to 4 times daily.Insulin requiring.  dx code E11.22  . albuterol (PROVENTIL HFA;VENTOLIN HFA) 108 (90 Base) MCG/ACT inhaler Inhale 2 puffs into the lungs every 6 (six) hours as needed for wheezing or shortness of breath.  . Alcohol Swabs (B-D SINGLE USE SWABS REGULAR) PADS Apply 1 application topically QID. Use to check blood sugar 3 to 4 times daily.Insulin requiring.  dx code E11.22  . amitriptyline (ELAVIL) 100 MG tablet Take 1 tablet (100 mg total) by mouth at bedtime.  Marland Kitchen aspirin 81 MG tablet Take 81 mg by mouth daily.  Marland Kitchen atorvastatin (LIPITOR) 40 MG tablet TAKE 1 TABLET (40 MG TOTAL) BY MOUTH DAILY.  Marland Kitchen Blood Glucose Calibration (ACCU-CHEK AVIVA) SOLN USE AS NEEDED AS DIRECTED FOR GLUCOMETER CALIBRATION  . Blood Glucose Monitoring Suppl (ACCU-CHEK AVIVA PLUS) w/Device KIT Use two times daily. Diagnosis code E11.22  . Blood Glucose Monitoring Suppl (ACCU-CHEK NANO SMARTVIEW) W/DEVICE KIT  1 each by Does not apply route 2 (two) times daily.  . DULoxetine (CYMBALTA) 30 MG capsule Take 1 capsule (30 mg total) by mouth daily.  . fluconazole (DIFLUCAN) 150 MG tablet Take 1 tablet (150 mg total) by mouth daily.  Marland Kitchen glucose blood (ACCU-CHEK AVIVA PLUS) test strip Use to check blood sugar 3 to 4 times daily.Insulin requiring.  dx code E11.22  . Insulin Pen Needle 33G X 5 MM MISC 1 Units by Does not apply route daily.  . Insulin Syringe-Needle U-100 31G X 15/64" 0.3 ML MISC 1 each by Does not  apply route 2 (two) times daily. Use to inject insulin twice daily dx code E11.22. Insulin dependent  . Lancet Devices (ACCU-CHEK SOFTCLIX) lancets Use to check blood sugar 3 to 4 times daily.Insulin requiring.  dx code E11.22  . Lancets Misc. (ACCU-CHEK SOFTCLIX LANCET DEV) KIT USE UP TO 4 TIMES DAILY TO CHECK BLOOD SUGAR. DIAG CODE E11.22. INSULIN DEPENDENT  . losartan (COZAAR) 50 MG tablet TAKE 1 TABLET (50 MG TOTAL) BY MOUTH DAILY.  . metFORMIN (GLUCOPHAGE-XR) 500 MG 24 hr tablet TAKE 1 TABLET (500 MG TOTAL) BY MOUTH DAILY WITH BREAKFAST.  . metoprolol tartrate (LOPRESSOR) 25 MG tablet Take 1 tablet (25 mg total) by mouth 2 (two) times daily.  Marland Kitchen NOVOLOG MIX 70/30 (70-30) 100 UNIT/ML injection INJECT 30 UNITS INTO THE SKIN 2 (TWO) TIMES DAILY WITH A MEAL.  . pantoprazole (PROTONIX) 40 MG tablet TAKE 1 TABLET EVERY DAY    FAMILY HISTORY:  Her indicated that the status of her mother is unknown. She indicated that the status of her father is unknown. She indicated that the status of her sister is unknown.   SOCIAL HISTORY: She  reports that she has been smoking cigarettes.  She has been smoking about 0.20 packs per day. She quit smokeless tobacco use about 3 years ago. She reports that she drinks alcohol. She reports that she does not use drugs.  REVIEW OF SYSTEMS:  Unable to complete as patient is altered on mechanical ventilation.   SUBJECTIVE:   VITAL SIGNS: BP (!) 143/71   Pulse 91   Temp (!) 97.1 F (36.2 C) (Temporal)   Resp 20   Ht 5' 8" (1.727 m)   Wt 220 lb (99.8 kg)   SpO2 100%   BMI 33.45 kg/m   HEMODYNAMICS:    VENTILATOR SETTINGS: Vent Mode: PRVC FiO2 (%):  [100 %] 100 % Set Rate:  [20 bmp] 20 bmp Vt Set:  [440 mL] 440 mL PEEP:  [5 cmH20] 5 cmH20  INTAKE / OUTPUT: No intake/output data recorded.  PHYSICAL EXAMINATION: General:  Obese female in NAD on vent Neuro: no response to pain, pupils 4-21m and not responsive to light HEENT:  ETT, L IJ TLC, MM pink /  moist  Cardiovascular:  s1s2 rrr, no m/r/g  Lungs:  Even/non-labored on vent, lungs bilaterally with wheezing, coarse  Abdomen:  Obese/soft, bsx4 active  Musculoskeletal:  No acute deformities  Skin: warm/dry, no edema, dirt on bottom of feet   LABS:  BMET Recent Labs  Lab 04/14/2018 1445  NA 132*  K 4.8  CL 99*  BUN 29*  CREATININE 1.50*  GLUCOSE 564*    Electrolytes No results for input(s): CALCIUM, MG, PHOS in the last 168 hours.  CBC Recent Labs  Lab 04/22/2018 1445  HGB 13.3  HCT 39.0    Coag's No results for input(s): APTT, INR in the last 168 hours.  Sepsis Markers Recent  Labs  Lab 03/26/2018 1445  LATICACIDVEN 8.69*    ABG No results for input(s): PHART, PCO2ART, PO2ART in the last 168 hours.  Liver Enzymes No results for input(s): AST, ALT, ALKPHOS, BILITOT, ALBUMIN in the last 168 hours.  Cardiac Enzymes No results for input(s): TROPONINI, PROBNP in the last 168 hours.  Glucose No results for input(s): GLUCAP in the last 168 hours.  Imaging Dg Chest Port 1 View  Result Date: 04/13/2018 CLINICAL DATA:  Respiratory failure EXAM: PORTABLE CHEST 1 VIEW COMPARISON:  02/28/2016 chest radiograph. FINDINGS: Endotracheal tube tip is 2.3 cm above the carina. Enteric tube loops in the proximal stomach with the tip in the lower thoracic esophagus. Stable cardiomediastinal silhouette with mild cardiomegaly. No pneumothorax. No pleural effusion. Borderline mild pulmonary edema. Mild left basilar atelectasis. IMPRESSION: 1. Well-positioned endotracheal tube. 2. Enteric tube loops in the proximal stomach with the tip in the lower thoracic esophagus. 3. Borderline mild congestive heart failure. 4. Mild left basilar atelectasis. These results were called by telephone at the time of interpretation on 04/18/2018 at 3:21 pm to Dr. Lajean Saver , who verbally acknowledged these results. Electronically Signed   By: Ilona Sorrel M.D.   On: 03/27/2018 15:22     STUDIES:  CT Head  5/5 >> slight blurring of the grey white interface with a pseudo subarachnoid hemorrhage appearance, suggestive of hypoxic ischemic brain injury ECHO 5/5 >>  CULTURES: BCx2 5/5 >>  UC 5/5 >>  Tracheal aspirate 5/5 >>  HIV 5/5 >>   ANTIBIOTICS: Rocephin (UTI) 5/5 >>   SIGNIFICANT EVENTS: 5/05  Admit post arrest   LINES/TUBES: ETT 5/5 >>  L IJ TLC 5/5 >>   DISCUSSION: 62 y/o F admitted 5/5 after cardiac arrest.  Concern for prolonged downtime > approximately one hour.    ASSESSMENT / PLAN:  PULMONARY A: Acute Hypoxic Respiratory Failure  R/O Aspiration  P:   PRVC 8cc/kg  Wean PEEP / FiO2 for sats > 90% Duoneb Q6 + PRN albuterol CXR in am  ABG in am   CARDIOVASCULAR A:  PEA Arrest  VT s/p Shock  Hx HTN, HLD P:  Suspected prolonged downtime prior to CPR.  Patient is not a candidate for TTM, will proceed with normothermia  ICU admit  PRN hydralazine for SBP > 180  Assess ECHO  Trend troponin  RENAL A:   AKI  Lactic Acidosis  Hyponatremia  P:   Trend BMP / urinary output Replace electrolytes as indicated Avoid nephrotoxic agents, ensure adequate renal perfusion Trend lactate   GASTROINTESTINAL A:   Obesity  P:   NPO  Insert OGT  Pepcid BID for SUP   HEMATOLOGIC A:   Anemia  Leukocytosis  P:  Trend CBC  SCD's   INFECTIOUS A:   Rule Out Aspiration  UTI  P:   Rocephin for UTI / empiric aspiration  Follow cultures as above   ENDOCRINE A:   DM    P:   SSI, resistant scale   NEUROLOGIC A:   Rule Out Anoxic Injury post Arrest  P:   RASS goal: 0 to -1  PRN fentanyl  PRN versed for seizure  Follow serial neuro exams   FAMILY  - Updates: Sister, nephew and brother in law updated at bedside.   - Inter-disciplinary family meet or Palliative Care meeting due by:  Ongoing.  Discussed possibility of neuro injury with family / concerning neuro exam.  Family states they recently lost their mother.  CC Time: 24 minutes    Noe Gens, NP-C [facility] Pgr: [phone] or if no answer [phone] 04/09/2018, 4:46 PM

## 2018-03-29 NOTE — Progress Notes (Signed)
Pt transported to [facility] via ventilator. No complications noted during transport. Report given to [facility] RRT RCP.

## 2018-03-29 NOTE — ED Notes (Signed)
Dr.Steinl made aware of Chem8 and Lactic Acid lab results. ED-LAb

## 2018-03-29 NOTE — Procedures (Signed)
Central Venous Catheter Insertion Procedure Note Linda Miller [id number] 1956/08/31  Procedure: Insertion of Central Venous Catheter Indications: Drug and/or fluid administration  Procedure Details Consent: Unable to obtain consent because of altered level of consciousness. Time Out: Verified patient identification, verified procedure, site/side was marked, verified correct patient position, special equipment/implants available, medications/allergies/relevent history reviewed, required imaging and test results available.  Performed  Maximum sterile technique was used including antiseptics, cap, gloves, gown, hand hygiene, mask and sheet. Skin prep: Chlorhexidine; local anesthetic administered A antimicrobial bonded/coated triple lumen catheter was placed in the left internal jugular vein using the Seldinger technique.  Evaluation Blood flow good Complications: No apparent complications Patient did tolerate procedure well. Chest X-ray ordered to verify placement.  CXR: pending.  Wael Z Aljishi 04/08/2018, 3:37 PM

## 2018-03-30 ENCOUNTER — Inpatient Hospital Stay (HOSPITAL_COMMUNITY): Payer: Medicare HMO

## 2018-03-30 ENCOUNTER — Other Ambulatory Visit: Payer: Self-pay

## 2018-03-30 DIAGNOSIS — I361 Nonrheumatic tricuspid (valve) insufficiency: Secondary | ICD-10-CM

## 2018-03-30 DIAGNOSIS — I340 Nonrheumatic mitral (valve) insufficiency: Secondary | ICD-10-CM

## 2018-03-30 LAB — ECHOCARDIOGRAM COMPLETE
Height: 68 in
Weight: 3463.87 oz

## 2018-03-30 LAB — GLUCOSE, CAPILLARY
GLUCOSE-CAPILLARY: 138 mg/dL — AB (ref 65–99)
GLUCOSE-CAPILLARY: 144 mg/dL — AB (ref 65–99)
GLUCOSE-CAPILLARY: 145 mg/dL — AB (ref 65–99)
GLUCOSE-CAPILLARY: 146 mg/dL — AB (ref 65–99)
GLUCOSE-CAPILLARY: 147 mg/dL — AB (ref 65–99)
GLUCOSE-CAPILLARY: 148 mg/dL — AB (ref 65–99)
GLUCOSE-CAPILLARY: 150 mg/dL — AB (ref 65–99)
GLUCOSE-CAPILLARY: 170 mg/dL — AB (ref 65–99)
GLUCOSE-CAPILLARY: 183 mg/dL — AB (ref 65–99)
GLUCOSE-CAPILLARY: 205 mg/dL — AB (ref 65–99)
GLUCOSE-CAPILLARY: 340 mg/dL — AB (ref 65–99)
GLUCOSE-CAPILLARY: 568 mg/dL — AB (ref 65–99)
Glucose-Capillary: 152 mg/dL — ABNORMAL HIGH (ref 65–99)
Glucose-Capillary: 214 mg/dL — ABNORMAL HIGH (ref 65–99)
Glucose-Capillary: 244 mg/dL — ABNORMAL HIGH (ref 65–99)
Glucose-Capillary: 396 mg/dL — ABNORMAL HIGH (ref 65–99)

## 2018-03-30 LAB — CBC
HCT: 36.1 % (ref 36.0–46.0)
HEMOGLOBIN: 11.3 g/dL — AB (ref 12.0–15.0)
MCH: 23.6 pg — ABNORMAL LOW (ref 26.0–34.0)
MCHC: 31.3 g/dL (ref 30.0–36.0)
MCV: 75.4 fL — ABNORMAL LOW (ref 78.0–100.0)
Platelets: 298 10*3/uL (ref 150–400)
RBC: 4.79 MIL/uL (ref 3.87–5.11)
RDW: 16.3 % — ABNORMAL HIGH (ref 11.5–15.5)
WBC: 21.5 10*3/uL — AB (ref 4.0–10.5)

## 2018-03-30 LAB — URINE CULTURE

## 2018-03-30 LAB — PHOSPHORUS
PHOSPHORUS: 2 mg/dL — AB (ref 2.5–4.6)
PHOSPHORUS: 1.5 mg/dL — AB (ref 2.5–4.6)
Phosphorus: 1.5 mg/dL — ABNORMAL LOW (ref 2.5–4.6)

## 2018-03-30 LAB — MAGNESIUM
MAGNESIUM: 2.7 mg/dL — AB (ref 1.7–2.4)
MAGNESIUM: 2.6 mg/dL — AB (ref 1.7–2.4)
MAGNESIUM: 2.4 mg/dL (ref 1.7–2.4)

## 2018-03-30 LAB — BLOOD GAS, ARTERIAL
ACID-BASE EXCESS: 1.7 mmol/L (ref 0.0–2.0)
BICARBONATE: 25 mmol/L (ref 20.0–28.0)
Drawn by: 414221
FIO2: 40
MECHVT: 460 mL
O2 SAT: 98.8 %
PEEP: 5 cmH2O
PH ART: 7.478 — AB (ref 7.350–7.450)
Patient temperature: 98.6
RATE: 22 resp/min
pCO2 arterial: 34.1 mmHg (ref 32.0–48.0)
pO2, Arterial: 119 mmHg — ABNORMAL HIGH (ref 83.0–108.0)

## 2018-03-30 LAB — BASIC METABOLIC PANEL
Anion gap: 12 (ref 5–15)
BUN: 20 mg/dL (ref 6–20)
CHLORIDE: 108 mmol/L (ref 101–111)
CO2: 27 mmol/L (ref 22–32)
Calcium: 8.9 mg/dL (ref 8.9–10.3)
Creatinine, Ser: 1.56 mg/dL — ABNORMAL HIGH (ref 0.44–1.00)
GFR calc non Af Amer: 35 mL/min — ABNORMAL LOW (ref >60)
GFR, EST AFRICAN AMERICAN: 41 mL/min — AB (ref >60)
GLUCOSE: 148 mg/dL — AB (ref 65–99)
Potassium: 3.6 mmol/L (ref 3.5–5.1)
Sodium: 147 mmol/L — ABNORMAL HIGH (ref 135–145)

## 2018-03-30 LAB — TROPONIN I: TROPONIN I: 0.06 ng/mL — AB (ref <0.03)

## 2018-03-30 MED ORDER — SODIUM CHLORIDE 0.9% FLUSH
3.0000 mL | Freq: Two times a day (BID) | INTRAVENOUS | Status: DC
  Administered 2018-03-30 – 2018-04-01 (×4): 3 mL via INTRAVENOUS

## 2018-03-30 MED ORDER — INSULIN ASPART 100 UNIT/ML ~~LOC~~ SOLN
2.0000 [IU] | SUBCUTANEOUS | Status: DC
  Administered 2018-03-30: 6 [IU] via SUBCUTANEOUS
  Administered 2018-03-30: 2 [IU] via SUBCUTANEOUS
  Administered 2018-03-30: 4 [IU] via SUBCUTANEOUS

## 2018-03-30 MED ORDER — INSULIN ASPART 100 UNIT/ML ~~LOC~~ SOLN
3.0000 [IU] | SUBCUTANEOUS | Status: DC
  Administered 2018-03-30 (×3): 3 [IU] via SUBCUTANEOUS

## 2018-03-30 MED ORDER — DEXTROSE 10 % IV SOLN: INTRAVENOUS | Status: DC | PRN

## 2018-03-30 MED ORDER — MUPIROCIN 2 % EX OINT
1.0000 "application " | TOPICAL_OINTMENT | Freq: Two times a day (BID) | CUTANEOUS | Status: DC
  Administered 2018-03-30 – 2018-04-01 (×5): 1 via NASAL
  Filled 2018-03-30 (×2): qty 22

## 2018-03-30 MED ORDER — CHLORHEXIDINE GLUCONATE CLOTH 2 % EX PADS
6.0000 | MEDICATED_PAD | Freq: Every day | CUTANEOUS | Status: DC
  Administered 2018-03-30 – 2018-04-01 (×3): 6 via TOPICAL

## 2018-03-30 MED ORDER — INSULIN DETEMIR 100 UNIT/ML ~~LOC~~ SOLN
10.0000 [IU] | Freq: Two times a day (BID) | SUBCUTANEOUS | Status: DC
  Administered 2018-03-30 (×2): 10 [IU] via SUBCUTANEOUS
  Filled 2018-03-30 (×3): qty 0.1

## 2018-03-30 MED ORDER — ACETAMINOPHEN 325 MG PO TABS: 650.0000 mg | ORAL_TABLET | Freq: Four times a day (QID) | ORAL | Status: DC | PRN

## 2018-03-30 MED ORDER — PRO-STAT SUGAR FREE PO LIQD
30.0000 mL | Freq: Four times a day (QID) | ORAL | Status: DC
  Administered 2018-03-30 – 2018-04-01 (×8): 30 mL
  Filled 2018-03-30 (×9): qty 30

## 2018-03-30 MED ORDER — PRO-STAT SUGAR FREE PO LIQD
30.0000 mL | Freq: Two times a day (BID) | ORAL | Status: DC
  Administered 2018-03-30: 30 mL
  Filled 2018-03-30: qty 30

## 2018-03-30 MED ORDER — VITAL HIGH PROTEIN PO LIQD
1000.0000 mL | ORAL | Status: DC
  Administered 2018-03-30 – 2018-04-01 (×4): 1000 mL

## 2018-03-30 MED ORDER — SODIUM CHLORIDE 0.9 % IV BOLUS
1000.0000 mL | Freq: Once | INTRAVENOUS | Status: AC
  Administered 2018-03-30: 1000 mL via INTRAVENOUS

## 2018-03-30 MED ORDER — SODIUM CHLORIDE 0.9% FLUSH: 3.0000 mL | INTRAVENOUS | Status: DC | PRN

## 2018-03-30 NOTE — Progress Notes (Signed)
PCCM Progress Note  Admission date: Apr 28, 2018 Referring provider: Dr. Dimple Casey, ER CC: Altered mental status  HPI: 62 yo female smoker found unresponsive at home with cardiac arrest after having progressive dyspnea for weeks.  UDS positive for cocaine. PMHx of Anxiety, DM, GERD, HLD, HTN, CHF, Neuropathy.  Subjective: Got versed this morning for elevated peak airway pressure.  Vital signs: BP (!) 105/58   Pulse (!) 102   Temp (!) 97 F (36.1 C)   Resp 20   Ht  (1.727 m)   Wt 216 lb 7.9 oz (98.2 kg)   SpO2 100%   BMI 32.92 kg/m   Intake/outpt: I/O last 3 completed shifts: In: 703.5 [I.V.:443.5; NG/GT:60; IV Piggyback:200] Out: 2825 [Urine:2425; Emesis/NG output:400]  Physical exam:  General - unresponsive Eyes - pupils midpoint ENT - ETT in place Cardiac - regular, no murmur Chest - no wheeze, rales Abd - soft, non tender Ext - no edema Skin - no rashes Neuro - unresponsive   CBC Recent Labs    04/28/2018 1445 04-28-2018 1512  WBC  --  21.5*  HGB 13.3 10.9*  HCT 39.0 36.8  PLT  --  324    Coag's Recent Labs    28-Apr-2018 1743  APTT 25  INR 1.17    BMET Recent Labs    2018-04-28 1512 04-28-2018 2011 03/30/18 0256  NA 133* 130* 147*  K 4.8 5.2* 3.6  CL 96* 93* 108  CO2 19* 17* 27  BUN 21* 25* 20  CREATININE 1.82* 2.01* 1.56*  GLUCOSE 553* 613* 148*    Electrolytes Recent Labs    Apr 28, 2018 1512 04-28-2018 2011 03/30/18 0256  CALCIUM 8.3* 8.1* 8.9  MG  --   --  2.7*  PHOS  --   --  2.0*    Sepsis Markers Recent Labs    2018-04-28 1738  PROCALCITON 0.19    ABG Recent Labs    04/28/2018 1759 03/30/18 0450  PHART 7.213* 7.478*  PCO2ART 57.0* 34.1  PO2ART 557.0* 119*    Liver Enzymes Recent Labs    04/28/18 1512  AST 307*  ALT 193*  ALKPHOS 99  BILITOT 0.6  ALBUMIN 2.9*    Cardiac Enzymes Recent Labs    28-Apr-2018 1738 2018-04-28 1800 03/30/18 0256  TROPONINI 0.05* 0.09* 0.06*    Glucose Recent Labs    03/30/18 0253  03/30/18 0347 03/30/18 0458 03/30/18 0555 03/30/18 0656 03/30/18 0744  GLUCAP 170* 144* 138* 145* 152* 150*    Imaging Ct Head Wo Contrast  Result Date: Apr 28, 2018 CLINICAL DATA:  Found unresponsive and pulseless.  Post CPR. EXAM: CT HEAD WITHOUT CONTRAST TECHNIQUE: Contiguous axial images were obtained from the base of the skull through the vertex without intravenous contrast. COMPARISON:  12/22/2006. FINDINGS: Brain: When compared with 12/22/2006, there appears to be slight blurring of the gray-white interface. Associated hyper attenuation of the vascular structures (i.e., pseudo subarachnoid hemorrhage appearance). Ventricles appears small. Otherwise, no evidence of a mass lesion or hydrocephalus. Vascular: No hyperdense vessel or unexpected calcification. Skull: Normal. Negative for fracture or focal lesion. Sinuses/Orbits: Opacification of the ethmoid air cells. Mucosal thickening in the maxillary sinuses. Mastoid air cells are clear. Other: None. 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 Electronically Signed   By: Leanna Battles M.D.   On: 2018/04/28 16:37   Dg Chest Port 1 View  Result Date: 03/30/2018 CLINICAL DATA:  Respiratory failure. EXAM: PORTABLE CHEST  1 VIEW COMPARISON:  04/09/2018 FINDINGS: Endotracheal tube terminates 8 mm above the carina. Withdrawal with approximately 2 cm is recommended. Enteric catheter descends below the left diaphragm. Left internal jugular approach central venous catheter terminates at the cavoatrial junction. Shock pads overlie the thorax. The cardiac silhouette is enlarged. Calcific atherosclerotic disease and tortuosity of the aorta. There is no evidence of focal airspace consolidation, pleural effusion or pneumothorax. Low lung volume with mildly increased interstitial markings. Osseous structures are without acute abnormality. Soft tissues  are grossly normal. IMPRESSION: Low lung volume with mildly increased interstitial markings. No evidence of pneumothorax. Endotracheal tube terminates 8 mm above the carina, with the role with approximately 2 cm is recommended. Electronically Signed   By: Ted Mcalpine M.D.   On: 03/30/2018 09:29   Dg Chest Port 1 View  Result Date: 03/26/2018 CLINICAL DATA:  Respiratory failure EXAM: PORTABLE CHEST 1 VIEW COMPARISON:  02/28/2016 chest radiograph. FINDINGS: Endotracheal tube tip is 2.3 cm above the carina. Enteric tube loops in the proximal stomach with the tip in the lower thoracic esophagus. Stable cardiomediastinal silhouette with mild cardiomegaly. No pneumothorax. No pleural effusion. Borderline mild pulmonary edema. Mild left basilar atelectasis. IMPRESSION: 1. Well-positioned endotracheal tube. 2. Enteric tube loops in the proximal stomach with the tip in the lower thoracic esophagus. 3. Borderline mild congestive heart failure. 4. Mild left basilar atelectasis. These results were called by telephone at the time of interpretation on 03/27/2018 at 3:21 pm to Dr. Cathren Laine , who verbally acknowledged these results. Electronically Signed   By: Delbert Phenix M.D.   On: 04/09/2018 15:22    Studies: CT head 5/05 >> concern for anoxic injury Echo 5/06 >>   Cultures: Blood 5/05 >> Sputum 5/05 >>  Antibiotics: Unasyn 5/05 >>   Events: 5/05 Admit  Lines/tubes: ETT 5/05 >> Lt IJ CVL 5/05 >>  Discussion: 62 yo with PEA arrest with about 1 hour for ROSC.  CT head concern for anoxic changes.  Assessment/plan:  Acute respiratory failure. - full vent support - f/u CXR  PEA cardiac arrest. Hx of HTN, HLD. - f/u Echo  Acute anoxic/metabolic encephalopathy. - monitor mental status - TTM at 36 degrees - if no improvement, then consider neuro consult 5/07  DM. - transition off insulin gtt  UTI, aspiration. - day 2 of Abx  DVT prophylaxis - SQ heparin SUP - pepcid Nutrition -  tube feeds Goals of care - full code  Updated pt's sister at bedside  CC time 32 minutes  Coralyn Helling, MD [facility] Pulmonary/Critical Care 03/30/2018, 10:11 AM

## 2018-03-30 NOTE — Plan of Care (Signed)
  Problem: Clinical Measurements: Goal: Diagnostic test results will improve Outcome: Progressing   Problem: Nutrition: Goal: Adequate nutrition will be maintained Outcome: Progressing   

## 2018-03-30 NOTE — Progress Notes (Addendum)
Initial Nutrition Assessment  DOCUMENTATION CODES:   Obesity unspecified  INTERVENTION:  Vital HP @ goal rate of 40 ml/hr (960 ml/24hr) + 4 pro-stat. Total regimen providing 1360 kcal, 144 grams of protein, and 806 ml H2O.  Pt phosphorus low (1.5 ) recommend supplementation.    NUTRITION DIAGNOSIS:   Inadequate oral intake related to inability to eat as evidenced by NPO status.  GOAL:   Provide needs based on ASPEN/SCCM guidelines  MONITOR:   Vent status, Skin, TF tolerance, Weight trends, Labs, I & O's  REASON FOR ASSESSMENT:   Consult, Ventilator Enteral/tube feeding initiation and management  ASSESSMENT:   62 y.o. F admitted on 04/15/2018 for cardiac arrest, AKI, and acute hypoxic respiratory failure. PMH of T2DM, htn, high cholesterol, osteomyelitis of R foot, GERD, CHF, anxiety, neuropathy. Pt is a current smoker and cocaine user. Pt intubated on admission.    Pt weight since admission: - 4 lbs; potential fluid. Pt on vent. Pt on normothermia protocol.  Pt unable to wake up per sister at bedside, "I'm not concerned because she needs her sleep, but the doctors are concerned". Pt sister reports pt eats poorly at home; "she doesn't eat good at all, eating fried foods and candy, really anything she can get her hands on". Pt sister reports trying to help pt make healthy changes to her diet. Pt sister reports pt had a very good appetite; 9 months ago her appetite decreased when their mom died, now her appetite is back to normal. Sister reports pt had no recent weight loss.   Medications reviewed: pepcid, prostat BID, vital HP, novolog 2-6 & 3 units, levemir 10 units, insulin regular IV, versed, 0.9% sodium chloride 250 ml/hr.   Labs reviewed: Na 147 (H), BG 148 (H), creatinine 1.56 (H), phosphorus 1.5 (L), magnesium 2.6 (H), GFR 35 (L), troponin 0.06 (H), WBC 21.5 (H), hemoglobin 11.3 (L).    Intake/Output Summary (Last 24 hours) at 03/30/2018 1659 Last data filed at 03/30/2018  1600 Gross per 24 hour  Intake 1160.44 ml  Output 3390 ml  Net -2229.56 ml   Lab Results  Component Value Date   HGBA1C 12.8 12/05/2017   NUTRITION - FOCUSED PHYSICAL EXAM:    Most Recent Value  Orbital Region  No depletion  Upper Arm Region  No depletion  Thoracic and Lumbar Region  No depletion  Buccal Region  No depletion  Temple Region  No depletion  Clavicle Bone Region  No depletion  Clavicle and Acromion Bone Region  No depletion  Scapular Bone Region  No depletion  Dorsal Hand  No depletion  Patellar Region  No depletion  Anterior Thigh Region  No depletion  Posterior Calf Region  No depletion  Edema (RD Assessment)  None  Hair  Reviewed  Eyes  Unable to assess  Mouth  Unable to assess  Skin  Reviewed  Nails  Reviewed       Diet Order:   Diet Order           Diet NPO time specified  Diet effective now          EDUCATION NEEDS:   Not appropriate for education at this time  Skin:  Skin Assessment: Reviewed RN Assessment  Last BM:  unknown  Height:   Ht Readings from Last 1 Encounters:  03/31/2018  (1.727 m)    Weight:   Wt Readings from Last 1 Encounters:  03/30/18 216 lb 7.9 oz (98.2 kg)    UBW: 179-197 lbs %  UBW: 110%  Ideal Body Weight:  63.63 kg  BMI:  Body mass index is 32.92 kg/m.  Estimated Nutritional Needs:   Kcal:  1080-1380 kcal  Protein:  130-145 grams  Fluid:  1L + UOP   Sherrine Maples, Dietetic Intern

## 2018-03-30 NOTE — Plan of Care (Signed)
TTM 36 explained to family members, appropriate questions answered.  Pt SBP 120-140 without need of pressor support, SR/ST 90-100's

## 2018-03-30 NOTE — Progress Notes (Signed)
  Echocardiogram 2D Echocardiogram has been performed.  Belva Chimes 03/30/2018, 2:01 PM

## 2018-03-30 NOTE — Progress Notes (Signed)
eLink Physician-Brief Progress Note Patient Name: Leia Coletti DOB: [birth year] MRN: [id number]   Date of Service  03/30/2018  HPI/Events of Note  Hypotension - BP = 84/52 with MAP = 63. Patient in warming phase. Suspect vasodilation.   eICU Interventions  Will order: 1. Bolus with 0.9 NaCl 1 liter IV over 1 hour now.  2. Monitor CVP now and Q 4 hours.      Intervention Category Major Interventions: Hypotension - evaluation and management  Armina Galloway Eugene 03/30/2018, 7:09 PM

## 2018-03-31 ENCOUNTER — Inpatient Hospital Stay (HOSPITAL_COMMUNITY): Payer: Medicare HMO

## 2018-03-31 DIAGNOSIS — R4020 Unspecified coma: Secondary | ICD-10-CM

## 2018-03-31 DIAGNOSIS — G931 Anoxic brain damage, not elsewhere classified: Secondary | ICD-10-CM

## 2018-03-31 LAB — GLUCOSE, CAPILLARY
GLUCOSE-CAPILLARY: 183 mg/dL — AB (ref 65–99)
GLUCOSE-CAPILLARY: 193 mg/dL — AB (ref 65–99)
GLUCOSE-CAPILLARY: 197 mg/dL — AB (ref 65–99)
GLUCOSE-CAPILLARY: 179 mg/dL — AB (ref 65–99)
GLUCOSE-CAPILLARY: 203 mg/dL — AB (ref 65–99)
GLUCOSE-CAPILLARY: 172 mg/dL — AB (ref 65–99)
GLUCOSE-CAPILLARY: 182 mg/dL — AB (ref 65–99)
GLUCOSE-CAPILLARY: 161 mg/dL — AB (ref 65–99)
GLUCOSE-CAPILLARY: 150 mg/dL — AB (ref 65–99)
Glucose-Capillary: 138 mg/dL — ABNORMAL HIGH (ref 65–99)
Glucose-Capillary: 151 mg/dL — ABNORMAL HIGH (ref 65–99)
Glucose-Capillary: 158 mg/dL — ABNORMAL HIGH (ref 65–99)
Glucose-Capillary: 162 mg/dL — ABNORMAL HIGH (ref 65–99)
Glucose-Capillary: 165 mg/dL — ABNORMAL HIGH (ref 65–99)
Glucose-Capillary: 179 mg/dL — ABNORMAL HIGH (ref 65–99)
Glucose-Capillary: 184 mg/dL — ABNORMAL HIGH (ref 65–99)
Glucose-Capillary: 186 mg/dL — ABNORMAL HIGH (ref 65–99)
Glucose-Capillary: 188 mg/dL — ABNORMAL HIGH (ref 65–99)
Glucose-Capillary: 190 mg/dL — ABNORMAL HIGH (ref 65–99)
Glucose-Capillary: 227 mg/dL — ABNORMAL HIGH (ref 65–99)
Glucose-Capillary: 242 mg/dL — ABNORMAL HIGH (ref 65–99)
Glucose-Capillary: 263 mg/dL — ABNORMAL HIGH (ref 65–99)

## 2018-03-31 LAB — CULTURE, RESPIRATORY

## 2018-03-31 LAB — COMPREHENSIVE METABOLIC PANEL
ALK PHOS: 88 U/L (ref 38–126)
ALT: 95 U/L — ABNORMAL HIGH (ref 14–54)
ANION GAP: 8 (ref 5–15)
AST: 63 U/L — ABNORMAL HIGH (ref 15–41)
Albumin: 2.4 g/dL — ABNORMAL LOW (ref 3.5–5.0)
BILIRUBIN TOTAL: 0.6 mg/dL (ref 0.3–1.2)
BUN: 27 mg/dL — ABNORMAL HIGH (ref 6–20)
CALCIUM: 8.7 mg/dL — AB (ref 8.9–10.3)
CO2: 26 mmol/L (ref 22–32)
CREATININE: 1.99 mg/dL — AB (ref 0.44–1.00)
Chloride: 117 mmol/L — ABNORMAL HIGH (ref 101–111)
GFR, EST AFRICAN AMERICAN: 30 mL/min — AB (ref >60)
GFR, EST NON AFRICAN AMERICAN: 26 mL/min — AB (ref >60)
Glucose, Bld: 223 mg/dL — ABNORMAL HIGH (ref 65–99)
Potassium: 2.8 mmol/L — ABNORMAL LOW (ref 3.5–5.1)
Sodium: 151 mmol/L — ABNORMAL HIGH (ref 135–145)
TOTAL PROTEIN: 6.1 g/dL — AB (ref 6.5–8.1)

## 2018-03-31 LAB — CBC
HCT: 33.8 % — ABNORMAL LOW (ref 36.0–46.0)
HEMOGLOBIN: 10.3 g/dL — AB (ref 12.0–15.0)
MCH: 23.3 pg — ABNORMAL LOW (ref 26.0–34.0)
MCHC: 30.5 g/dL (ref 30.0–36.0)
MCV: 76.3 fL — ABNORMAL LOW (ref 78.0–100.0)
PLATELETS: 234 10*3/uL (ref 150–400)
RBC: 4.43 MIL/uL (ref 3.87–5.11)
RDW: 16.3 % — ABNORMAL HIGH (ref 11.5–15.5)
WBC: 22.6 10*3/uL — ABNORMAL HIGH (ref 4.0–10.5)

## 2018-03-31 LAB — MAGNESIUM
Magnesium: 2.3 mg/dL (ref 1.7–2.4)
Magnesium: 2.3 mg/dL (ref 1.7–2.4)

## 2018-03-31 LAB — PHOSPHORUS
Phosphorus: 1.9 mg/dL — ABNORMAL LOW (ref 2.5–4.6)
Phosphorus: 1.3 mg/dL — ABNORMAL LOW (ref 2.5–4.6)

## 2018-03-31 LAB — CULTURE, RESPIRATORY W GRAM STAIN

## 2018-03-31 MED ORDER — SODIUM CHLORIDE 0.9 % IV BOLUS
500.0000 mL | Freq: Once | INTRAVENOUS | Status: AC
  Administered 2018-03-31: 500 mL via INTRAVENOUS

## 2018-03-31 MED ORDER — POTASSIUM CHLORIDE 20 MEQ/15ML (10%) PO SOLN
40.0000 meq | Freq: Once | ORAL | Status: AC
  Administered 2018-03-31: 40 meq
  Filled 2018-03-31: qty 30

## 2018-03-31 NOTE — Progress Notes (Signed)
Patient blood sugar is greater than 250. Glucostabilizer restarted along with insulin gtt per ICU hyperglycemia order protocol.

## 2018-03-31 NOTE — Progress Notes (Signed)
PCCM Progress Note  Admission date: 04/08/2018 Referring provider: Dr. Dimple Casey, ER CC: Altered mental status  HPI: 62 yo female smoker found unresponsive at home with cardiac arrest after having progressive dyspnea for weeks.  UDS positive for cocaine. PMHx of Anxiety, DM, GERD, HLD, HTN, CHF, Neuropathy.  Subjective: Off all sedation.  Extensive noxious stimuli.  Neurologically she appears to be herniating.  Vital signs: BP 90/61   Pulse 93   Temp 98.6 F (37 C) (Bladder)   Resp (!) 22   Ht  (1.727 m)   Wt 100.7 kg (222 lb 0.1 oz) Comment: pads have been subtracted  SpO2 100%   BMI 33.76 kg/m   Intake/outpt: I/O last 3 completed shifts: In: 3074.5 [I.V.:777.7; NG/GT:980; IV Piggyback:1316.8] Out: 3210 [Urine:2810; Emesis/NG output:400]  Physical exam:  General: Obese female who extends to noxious stimuli HEENT: Pupils are not equal at 4 mm, negative doll's eyes, no gag reflex, extends to noxious stimuli Neuro: Extends to noxious stimuli CV: s1s2 rrr, no m/r/g PULM: even/non-labored, lungs bilaterally rhonchi FI:EPPI, non-tender, bsx4 active  Extremities: warm/dry, 1+ edema  Skin: no rashes or lesions    CBC Recent Labs    04/21/2018 1512 03/30/18 1047 03/31/18 0525  WBC 21.5* 21.5* 22.6*  HGB 10.9* 11.3* 10.3*  HCT 36.8 36.1 33.8*  PLT 324 298 234    Coag's Recent Labs    03/28/2018 1743  APTT 25  INR 1.17    BMET Recent Labs    03/31/2018 2011 03/30/18 0256 03/31/18 0525  NA 130* 147* 151*  K 5.2* 3.6 2.8*  CL 93* 108 117*  CO2 17* 27 26  BUN 25* 20 27*  CREATININE 2.01* 1.56* 1.99*  GLUCOSE 613* 148* 223*    Electrolytes Recent Labs    04/11/2018 2011  03/30/18 0256 03/30/18 1047 03/30/18 1645 03/31/18 0525  CALCIUM 8.1*  --  8.9  --   --  8.7*  MG  --    < > 2.7* 2.6* 2.4 2.3  PHOS  --    < > 2.0* 1.5* 1.5* 1.9*   < > = values in this interval not displayed.    Sepsis Markers Recent Labs    03/27/2018 1738  PROCALCITON 0.19     ABG Recent Labs    04/20/2018 1759 03/30/18 0450  PHART 7.213* 7.478*  PCO2ART 57.0* 34.1  PO2ART 557.0* 119*    Liver Enzymes Recent Labs    04/10/2018 1512 03/31/18 0525  AST 307* 63*  ALT 193* 95*  ALKPHOS 99 88  BILITOT 0.6 0.6  ALBUMIN 2.9* 2.4*    Cardiac Enzymes Recent Labs    04/21/2018 1738 04/02/2018 1800 03/30/18 0256  TROPONINI 0.05* 0.09* 0.06*    Glucose Recent Labs    03/31/18 0239 03/31/18 0356 03/31/18 0531 03/31/18 0642 03/31/18 0741 03/31/18 0841  GLUCAP 242* 183* 193* 197* 158* 138*    Imaging Ct Head Wo Contrast  Result Date: 04/13/2018 CLINICAL DATA:  Found unresponsive and pulseless.  Post CPR. EXAM: CT HEAD WITHOUT CONTRAST TECHNIQUE: Contiguous axial images were obtained from the base of the skull through the vertex without intravenous contrast. COMPARISON:  12/22/2006. FINDINGS: Brain: When compared with 12/22/2006, there appears to be slight blurring of the gray-white interface. Associated hyper attenuation of the vascular structures (i.e., pseudo subarachnoid hemorrhage appearance). Ventricles appears small. Otherwise, no evidence of a mass lesion or hydrocephalus. Vascular: No hyperdense vessel or unexpected calcification. Skull: Normal. Negative for fracture or focal lesion. Sinuses/Orbits: Opacification of the  ethmoid air cells. Mucosal thickening in the maxillary sinuses. Mastoid air cells are clear. Other: None. 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 Electronically Signed   By: Leanna Battles M.D.   On: 04/13/2018 16:37   Dg Chest Port 1 View  Result Date: 03/30/2018 CLINICAL DATA:  Respiratory failure. EXAM: PORTABLE CHEST 1 VIEW COMPARISON:  03/26/2018 FINDINGS: Endotracheal tube terminates 8 mm above the carina. Withdrawal with approximately 2 cm is recommended. Enteric catheter descends below the left diaphragm.  Left internal jugular approach central venous catheter terminates at the cavoatrial junction. Shock pads overlie the thorax. The cardiac silhouette is enlarged. Calcific atherosclerotic disease and tortuosity of the aorta. There is no evidence of focal airspace consolidation, pleural effusion or pneumothorax. Low lung volume with mildly increased interstitial markings. Osseous structures are without acute abnormality. Soft tissues are grossly normal. IMPRESSION: Low lung volume with mildly increased interstitial markings. No evidence of pneumothorax. Endotracheal tube terminates 8 mm above the carina, with the role with approximately 2 cm is recommended. Electronically Signed   By: Ted Mcalpine M.D.   On: 03/30/2018 09:29   Dg Chest Port 1 View  Result Date: 04/08/2018 CLINICAL DATA:  Respiratory failure EXAM: PORTABLE CHEST 1 VIEW COMPARISON:  02/28/2016 chest radiograph. FINDINGS: Endotracheal tube tip is 2.3 cm above the carina. Enteric tube loops in the proximal stomach with the tip in the lower thoracic esophagus. Stable cardiomediastinal silhouette with mild cardiomegaly. No pneumothorax. No pleural effusion. Borderline mild pulmonary edema. Mild left basilar atelectasis. IMPRESSION: 1. Well-positioned endotracheal tube. 2. Enteric tube loops in the proximal stomach with the tip in the lower thoracic esophagus. 3. Borderline mild congestive heart failure. 4. Mild left basilar atelectasis. These results were called by telephone at the time of interpretation on 04/19/2018 at 3:21 pm to Dr. Cathren Laine , who verbally acknowledged these results. Electronically Signed   By: Delbert Phenix M.D.   On: 04/23/2018 15:22    Studies: CT head 5/05 >> concern for anoxic injury Echo 5/06 >> EF 55 to 60% grade 1 diastolic dysfunction, mild mitral valve regurgitation  Cultures: Blood 5/05 >> Sputum 5/05 >>  Antibiotics: Unasyn 5/05 >>   Events: 5/05 Admit  Lines/tubes: ETT 5/05 >> Lt IJ CVL 5/05  >>  Discussion: 62 yo with PEA arrest with about 1 hour for ROSC.  CT head concern for anoxic changes.  Assessment/plan:  Acute respiratory failure. -Vent bundle, respiratory rate turned down to 10 she does not over breathe the vent. -5/ 7 chest x-ray with right lower lobe aspiration more evident  PEA cardiac arrest. Hx of HTN, HLD. -Per normal thermal protocol  Acute anoxic/metabolic encephalopathy. -03/31/2018 not requiring any sedation.  She is extending to noxious stimuli.  She has pupils are fixed 4 to 5 mm negative doll's eyes no gag reflex -Therapy protocol 36 degrees -We will get neuro consult on 5 7  DM. CBG (last 3)  Recent Labs    03/31/18 0642 03/31/18 0741 03/31/18 0841  GLUCAP 197* 158* 138*    -Transition off insulin drip, monitor for hypoglycemia  UTI questionable aspiration -Day 3 of antibiotics Unasyn Urine culture with multiple species  DVT prophylaxis -subcu hep SUP -Pepcid Nutrition -tube feeds Goals of care -full code  03/31/2018 family members asleep at bedside did not arouse during examination.  App CCT 40 min   Brett Canales Minor ACNP [facility] PCCM Pager [phone] till 1 pm  If no answer page [phone] 03/31/2018, 9:03 AM

## 2018-03-31 NOTE — Consult Note (Addendum)
NEURO HOSPITALIST CONSULT NOTE   Requestig physician: [place]  Reason for Consult: Anoxic brain injury post cardiac arrest  History obtained from:  Chart Review  HPI:                                                                                                                                          Linda Miller is an 62 y.o. female with a history of DM, CHF, hypercholesterolemia, HTN, and neuropathy who was found down on 04/22/2018 and admitted for cardiac arrest. Per family she was last seen well about 15 minutes before she was found down. EMS found the patient unresponsive and pulseless. CPR was performed with EPI given 3 times. ROSC achieved and patient remained intubated, not sedated and did not respond to pain. Patient positive for cocaine. Neurology consulted on 03/31/18 for anoxic brain injury post cardiac arrest. . Past Medical History:  Diagnosis Date  . Anxiety   . CHF (congestive heart failure) ([place])   . Diabetes mellitus   . GERD (gastroesophageal reflux disease) 03/29/2016  . High cholesterol   . Hypertension   . Neuropathy   . Osteomyelitis of right foot [facility])     Past Surgical History:  Procedure Laterality Date  . ABDOMINAL HYSTERECTOMY    . CARDIAC CATHETERIZATION N/A 03/05/2016   Procedure: Left Heart Cath and Coronary Angiography;  Surgeon: Belva Crome, MD;  Location: [facility];  Service: Cardiovascular;  Laterality: N/A;  . DEBRIDEMENT  FOOT    . I&D EXTREMITY  09/23/2012   Procedure: IRRIGATION AND DEBRIDEMENT EXTREMITY;  Surgeon: Newt Minion, MD;  Location: [place];  Service: Orthopedics;  Laterality: Right;  . TEE WITHOUT CARDIOVERSION  09/24/2012   Procedure: TRANSESOPHAGEAL ECHOCARDIOGRAM (TEE);  Surgeon: Larey Dresser, MD;  Location: [facility] ENDOSCOPY;  Service: Cardiovascular;  Laterality: N/A;    Family History  Problem Relation Age of Onset  . Diabetes Mother   . Stroke Mother   . CAD Mother   . Cancer Father   . CAD  Sister        Social History:  reports that she has been smoking cigarettes.  She has been smoking about 0.20 packs per day. She quit smokeless tobacco use about 3 years ago. She reports that she drinks alcohol. She reports that she does not use drugs.  Allergies  Allergen Reactions  . Coconut Fatty Acids Itching and Swelling  . Codeine Itching and Swelling    Throat  . Lisinopril Cough  . Tramadol Hives and Itching  . Latex Itching and Rash  . Lyrica [Pregabalin] Itching and Rash    MEDICATIONS:  Scheduled: . chlorhexidine gluconate (MEDLINE KIT)  15 mL Mouth Rinse BID  . Chlorhexidine Gluconate Cloth  6 each Topical Daily  . Chlorhexidine Gluconate Cloth  6 each Topical Q0600  . famotidine  20 mg Per Tube BID  . feeding supplement (PRO-STAT SUGAR FREE 64)  30 mL Per Tube QID  . feeding supplement (VITAL HIGH PROTEIN)  1,000 mL Per Tube Q24H  . mouth rinse  15 mL Mouth Rinse 10 times per day  . mupirocin ointment  1 application Nasal BID  . sodium chloride flush  10-40 mL Intracatheter Q12H  . sodium chloride flush  3 mL Intravenous Q12H   Continuous: . sodium chloride 10 mL/hr at 03/31/18 0800  . ampicillin-sulbactam (UNASYN) IV 3 g (03/31/18 1417)  . insulin (NOVOLIN-R) infusion 8.6 Units/hr (03/31/18 1310)   OVZ:CHYIFOYDXAJOI, albuterol, docusate, fentaNYL (SUBLIMAZE) injection, hydrALAZINE, midazolam, ondansetron (ZOFRAN) IV, sodium chloride flush, sodium chloride flush    Physical Exam  NOM:VEHMCNO in bed intubated, not sedated. Not breathing over the vent. Patient normothermic after being cooled. Has been normothermic since 07:20 this morning. Cooling protocol not followed; patient had cooling blanket present for temperature control only. HEENT-  Normocephalic/atraumatic. Corneal and scleral edema present bilaterally.  Lungs-intubated Extremities- Warm,  dry and intact Musculoskeletal-no joint tenderness, deformity or swelling Skin-warm and dry, no hyperpigmentation, vitiligo, or suspicious lesions  Neuro:  Mental Status: Comatose, with decerebrate posturing spontaneously and in response to stimuli. No volitional movement, no withdrawal to pain or localization to pain.  Cranial Nerves: Ptosis not present, pupils 8m non-reactive to light,  intubated, no gag or cough per nursing. No doll's eye reflex. No corneal reflex. No blink to threat.  Face flaccidly symmetric.  Motor: Tone and bulk: Flaccid except during posturing. No atrophy noted Sensory: Inconsistent decerebrate posturing to painful stimuli; reacts to stimuli all 4 extremities. Does not localize to pain.    Deep Tendon Reflexes: Hypoactive in all 4 extremities.  Plantars: Pathological exaggerated ankle and toe plantar flexion to plantar stimulation.  Cerebellar/Gait: Unable to assess     Lab Results: Basic Metabolic Panel: Recent Labs  Lab 03/31/2018 1445  04/22/2018 1512 04/16/2018 2011 03/30/18 0256 03/30/18 1047 03/30/18 1645 03/31/18 0525  NA 132*  --  133* 130* 147*  --   --  151*  K 4.8  --  4.8 5.2* 3.6  --   --  2.8*  CL 99*  --  96* 93* 108  --   --  117*  CO2  --   --  19* 17* 27  --   --  26  GLUCOSE 564*  --  553* 613* 148*  --   --  223*  BUN 29*  --  21* 25* 20  --   --  27*  CREATININE 1.50*  --  1.82* 2.01* 1.56*  --   --  1.99*  CALCIUM  --    < > 8.3* 8.1* 8.9  --   --  8.7*  MG  --   --   --   --  2.7* 2.6* 2.4 2.3  PHOS  --   --   --   --  2.0* 1.5* 1.5* 1.9*   < > = values in this interval not displayed.    CBC: Recent Labs  Lab 04/21/2018 1445 04/19/2018 1512 03/30/18 1047 03/31/18 0525  WBC  --  21.5* 21.5* 22.6*  HGB 13.3 10.9* 11.3* 10.3*  HCT 39.0 36.8 36.1 33.8*  MCV  --  79.3  75.4* 76.3*  PLT  --  324 298 234    Cardiac Enzymes: Recent Labs  Lab 04/06/2018 1738 03/27/2018 1800 03/30/18 0256  TROPONINI 0.05* 0.09* 0.06*    Lipid  Panel: No results for input(s): CHOL, TRIG, HDL, CHOLHDL, VLDL, LDLCALC in the last 168 hours.  Imaging: Ct Head Wo Contrast  Result Date: 04/14/2018 CLINICAL DATA:  Found unresponsive and pulseless.  Post CPR. EXAM: CT HEAD WITHOUT CONTRAST TECHNIQUE: Contiguous axial images were obtained from the base of the skull through the vertex without intravenous contrast. COMPARISON:  12/22/2006. FINDINGS: Brain: When compared with 12/22/2006, there appears to be slight blurring of the gray-white interface. Associated hyper attenuation of the vascular structures (i.e., pseudo subarachnoid hemorrhage appearance). Ventricles appears small. Otherwise, no evidence of a mass lesion or hydrocephalus. Vascular: No hyperdense vessel or unexpected calcification. Skull: Normal. Negative for fracture or focal lesion. Sinuses/Orbits: Opacification of the ethmoid air cells. Mucosal thickening in the maxillary sinuses. Mastoid air cells are clear. Other: None. 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 Electronically Signed   By: Lorin Picket M.D.   On: 04/06/2018 16:37   Dg Chest Port 1 View  Result Date: 03/31/2018 CLINICAL DATA:  Endotracheal tube present, respiratory failure EXAM: PORTABLE CHEST 1 VIEW COMPARISON:  Portable chest x-ray of 03/30/2017 FINDINGS: The tip of the endotracheal tube is only 5 mm above the carina and could be withdrawn 2-3 cm. Cardiomegaly is stable and there appears to be mild pulmonary vascular congestion present. NG tube extends below the hemidiaphragm. Left IJ central venous line tip overlies the expected right atrium. IMPRESSION: 1. Tip of endotracheal tube only 5 mm above the carina. Recommend withdrawing by 2-3 cm. 2. Cardiomegaly. Question of pulmonary vascular congestion now present. Electronically Signed   By: Ivar Drape M.D.   On: 03/31/2018 09:58   Dg Chest Port 1  View  Result Date: 03/30/2018 CLINICAL DATA:  Respiratory failure. EXAM: PORTABLE CHEST 1 VIEW COMPARISON:  04/18/2018 FINDINGS: Endotracheal tube terminates 8 mm above the carina. Withdrawal with approximately 2 cm is recommended. Enteric catheter descends below the left diaphragm. Left internal jugular approach central venous catheter terminates at the cavoatrial junction. Shock pads overlie the thorax. The cardiac silhouette is enlarged. Calcific atherosclerotic disease and tortuosity of the aorta. There is no evidence of focal airspace consolidation, pleural effusion or pneumothorax. Low lung volume with mildly increased interstitial markings. Osseous structures are without acute abnormality. Soft tissues are grossly normal. IMPRESSION: Low lung volume with mildly increased interstitial markings. No evidence of pneumothorax. Endotracheal tube terminates 8 mm above the carina, with the role with approximately 2 cm is recommended. Electronically Signed   By: Fidela Salisbury M.D.   On: 03/30/2018 09:29   Dg Chest Port 1 View  Result Date: 04/12/2018 CLINICAL DATA:  Respiratory failure EXAM: PORTABLE CHEST 1 VIEW COMPARISON:  02/28/2016 chest radiograph. FINDINGS: Endotracheal tube tip is 2.3 cm above the carina. Enteric tube loops in the proximal stomach with the tip in the lower thoracic esophagus. Stable cardiomediastinal silhouette with mild cardiomegaly. No pneumothorax. No pleural effusion. Borderline mild pulmonary edema. Mild left basilar atelectasis. IMPRESSION: 1. Well-positioned endotracheal tube. 2. Enteric tube loops in the proximal stomach with the tip in the lower thoracic esophagus. 3. Borderline mild congestive heart failure. 4. Mild left basilar atelectasis. These results were called by telephone at the time of interpretation on 03/26/2018 at 3:21 pm to Dr.  Lajean Saver , who verbally acknowledged these results. Electronically Signed   By: Ilona Sorrel M.D.   On: 04/09/2018 15:22   History  and examination Laurey Morale, NP-C, [facility] [phone]  Assessment: 62 year old female with anoxic brain injury 1. Exam with no evidence for cortical function as well as loss of several brainstem reflexes is suggestive of a high likelihood of a poor outcome 2. 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 CT findings at this early stage also predictive of a poor outcome.   Recommendations: 1. Continued supportive care for at least an additional 24 hours 2. Reassessment of Neurological examination on Wednesday after 2 PM (72 hours since anoxic event), to reassess prognosis.   40 minutes spent in the neurological evaluation and management of this critically ill anoxic brain injury patient.   Electronically signed: Dr. Kerney Elbe      03/31/2018, 2:41 PM

## 2018-04-01 ENCOUNTER — Inpatient Hospital Stay (HOSPITAL_COMMUNITY): Payer: Medicare HMO

## 2018-04-01 LAB — GLUCOSE, CAPILLARY
GLUCOSE-CAPILLARY: 147 mg/dL — AB (ref 65–99)
GLUCOSE-CAPILLARY: 156 mg/dL — AB (ref 65–99)
GLUCOSE-CAPILLARY: 169 mg/dL — AB (ref 65–99)
GLUCOSE-CAPILLARY: 169 mg/dL — AB (ref 65–99)
GLUCOSE-CAPILLARY: 206 mg/dL — AB (ref 65–99)
GLUCOSE-CAPILLARY: 214 mg/dL — AB (ref 65–99)
GLUCOSE-CAPILLARY: 169 mg/dL — AB (ref 65–99)
GLUCOSE-CAPILLARY: 186 mg/dL — AB (ref 65–99)
Glucose-Capillary: 161 mg/dL — ABNORMAL HIGH (ref 65–99)
Glucose-Capillary: 163 mg/dL — ABNORMAL HIGH (ref 65–99)
Glucose-Capillary: 170 mg/dL — ABNORMAL HIGH (ref 65–99)
Glucose-Capillary: 173 mg/dL — ABNORMAL HIGH (ref 65–99)
Glucose-Capillary: 175 mg/dL — ABNORMAL HIGH (ref 65–99)
Glucose-Capillary: 181 mg/dL — ABNORMAL HIGH (ref 65–99)
Glucose-Capillary: 193 mg/dL — ABNORMAL HIGH (ref 65–99)
Glucose-Capillary: 195 mg/dL — ABNORMAL HIGH (ref 65–99)

## 2018-04-01 LAB — BASIC METABOLIC PANEL
Anion gap: 8 (ref 5–15)
BUN: 35 mg/dL — AB (ref 6–20)
CALCIUM: 9.5 mg/dL (ref 8.9–10.3)
CO2: 25 mmol/L (ref 22–32)
CREATININE: 1.55 mg/dL — AB (ref 0.44–1.00)
Chloride: 129 mmol/L — ABNORMAL HIGH (ref 101–111)
GFR calc Af Amer: 41 mL/min — ABNORMAL LOW (ref >60)
GFR, EST NON AFRICAN AMERICAN: 35 mL/min — AB (ref >60)
Glucose, Bld: 189 mg/dL — ABNORMAL HIGH (ref 65–99)
Potassium: 3.1 mmol/L — ABNORMAL LOW (ref 3.5–5.1)
SODIUM: 162 mmol/L — AB (ref 135–145)

## 2018-04-01 LAB — POCT I-STAT 3, ART BLOOD GAS (G3+)
Acid-base deficit: 3 mmol/L — ABNORMAL HIGH (ref 0.0–2.0)
Acid-base deficit: 4 mmol/L — ABNORMAL HIGH (ref 0.0–2.0)
Bicarbonate: 21.3 mmol/L (ref 20.0–28.0)
Bicarbonate: 23.1 mmol/L (ref 20.0–28.0)
O2 Saturation: 78 %
O2 Saturation: 98 %
PCO2 ART: 36.1 mmHg (ref 32.0–48.0)
PH ART: 7.242 — AB (ref 7.350–7.450)
PH ART: 7.379 (ref 7.350–7.450)
Patient temperature: 37
TCO2: 22 mmol/L (ref 22–32)
TCO2: 25 mmol/L (ref 22–32)
pCO2 arterial: 53.8 mmHg — ABNORMAL HIGH (ref 32.0–48.0)
pO2, Arterial: 107 mmHg (ref 83.0–108.0)
pO2, Arterial: 51 mmHg — ABNORMAL LOW (ref 83.0–108.0)

## 2018-04-01 MED ORDER — SODIUM CHLORIDE 0.45 % IV SOLN
INTRAVENOUS | Status: DC
  Administered 2018-04-01: 07:00:00 via INTRAVENOUS

## 2018-04-01 MED ORDER — FREE WATER
200.0000 mL | Status: DC
  Administered 2018-04-01: 200 mL

## 2018-04-01 MED ORDER — CHLORHEXIDINE GLUCONATE CLOTH 2 % EX PADS: 6.0000 | MEDICATED_PAD | Freq: Every day | CUTANEOUS | Status: DC

## 2018-04-01 MED ORDER — MORPHINE 100MG IN NS 100ML (1MG/ML) PREMIX INFUSION
10.0000 mg/h | INTRAVENOUS | Status: DC
  Administered 2018-04-01: 10 mg/h via INTRAVENOUS
  Filled 2018-04-01: qty 100

## 2018-04-01 MED ORDER — MORPHINE BOLUS VIA INFUSION
5.0000 mg | INTRAVENOUS | Status: DC | PRN
  Filled 2018-04-01: qty 20

## 2018-04-01 MED ORDER — INSULIN GLARGINE 100 UNIT/ML ~~LOC~~ SOLN
10.0000 [IU] | Freq: Two times a day (BID) | SUBCUTANEOUS | Status: DC
  Administered 2018-04-01: 10 [IU] via SUBCUTANEOUS
  Filled 2018-04-01 (×2): qty 0.1

## 2018-04-01 MED ORDER — FREE WATER
300.0000 mL | Status: DC
  Administered 2018-04-01 (×2): 300 mL

## 2018-04-01 MED ORDER — POTASSIUM CHLORIDE 10 MEQ/50ML IV SOLN
10.0000 meq | INTRAVENOUS | Status: AC
  Administered 2018-04-01 (×4): 10 meq via INTRAVENOUS
  Filled 2018-04-01 (×4): qty 50

## 2018-04-01 MED ORDER — DEXTROSE-NACL 5-0.45 % IV SOLN
INTRAVENOUS | Status: DC
  Administered 2018-04-01: 10:00:00 via INTRAVENOUS

## 2018-04-03 ENCOUNTER — Encounter: Payer: Medicare HMO | Admitting: Internal Medicine

## 2018-04-03 LAB — CULTURE, BLOOD (ROUTINE X 2)
CULTURE: NO GROWTH
Culture: NO GROWTH
SPECIAL REQUESTS: ADEQUATE

## 2018-04-06 ENCOUNTER — Telehealth: Payer: Self-pay

## 2018-04-06 NOTE — Telephone Encounter (Signed)
On 04/06/18 I received a d/c from [facility]. (original). The d/c is for cremation. The patient is a patient of [facility]. The d/c will be taken to [facility] [address] for signature.  On 04/07/18 I received the d/c back from [facility] [place]. I got the d/c ready and called the funeral home to let them know the d/c is ready for pickup. I also faxed a copy to the funeral home per the funeral home request.

## 2018-04-25 NOTE — Progress Notes (Signed)
Comforting this family during this tough process.  Family decided to move towards comfort care for their loved one.  Ministry of compassionate presence, prayer, emotional support provided.  Will share with on coming Chaplain to provide additional support.    04/16/2018 1641  Clinical Encounter Type  Visited With Patient and family together;Health care provider  Visit Type Initial;Spiritual support;Critical Care  Spiritual Encounters  Spiritual Needs Prayer;Emotional;Grief support

## 2018-04-25 NOTE — Progress Notes (Addendum)
eLink Physician-Brief Progress Note Patient Name: Linda Miller DOB: 1956/09/03 MRN: [id number]   Date of Service  2018/04/24  HPI/Events of Note  Na+ = 162, K+ = 3.1 and Creatinine = 1.55  eICU Interventions  Will order: 1. Replace K+. 2. Free water 200 mL per tube Q 4 hours.  3. 0.45 NaCl to run at 100 mL/hour.  4. Repeat BMP at 1 PM.      Intervention Category Major Interventions: Electrolyte abnormality - evaluation and management  Sommer,Steven Eugene 2018/04/24, 6:01 AM

## 2018-04-25 NOTE — Progress Notes (Signed)
CRITICAL VALUE ALERT  Critical Value:  Na 162  Date & Time Notied:  04/27/18 at 0534  Provider Notified: Arsenio Loader, MD  Orders Received/Actions taken: 0.45 NaCl to run at 100 mL/hour

## 2018-04-25 NOTE — Procedures (Signed)
Extubation Procedure Note  Patient Details:   Name: Linda Miller DOB: 1956/10/26 MRN: [id number]   Airway Documentation:    Vent end date: 03/27/2018 Vent end time: 1700   Evaluation  O2 sats: stable throughout Complications: No apparent complications Patient did tolerate procedure well. Bilateral Breath Sounds: Diminished, Clear(coarse)   No   Patient was extubated with an end of life withdrawal order. Family and primary RN at bedside with RT during extubation.  Darolyn Rua 04/05/2018, 5:13 PM

## 2018-04-25 NOTE — Progress Notes (Addendum)
Subjective: Patient in bed eyes closed, intubated not sedated. NAD, not breathing above the vent. Patient normothermic cooling blanket still present. Not on cooling protocol.  Exam: Vitals:   2018-04-28 0750 04-28-2018 0800  BP: 130/63 138/63  Pulse: 74 75  Resp: (!) 22 (!) 22  Temp:  98.6 F (37 C)  SpO2: 100% 100%    Physical Exam   HEENT-  Normocephalic/atraumatic, corneal and scleral edema present.  Lungs-RR set to 22; vented Extremities- Warm, dry and intact Musculoskeletal-no joint tenderness, deformity or swelling Skin-warm and dry, no suspicious lesions  Neuro:    Mental status: Comatose; patients eyes closed, not responding to name. Eyes do not spontaneously open. Does not attempt to communicate. Decerebrate posturing spontaneously and in response to stimuli, no withdrawal or localization to pain. CN: No doll's eye reflex, no blink to threat, no corneal reflex. Face symmetric. No grimace, No cough, No gag. Does not respond to auditory stimuli. Motor: All extremities flaccid, no atrophy noted. Negative babinski. Primitive grip reflexes noted in hands bilaterally. Sensory: Decerebrate posturing in response to stimuli, no localization or withdrawal to painful stimulation. DTR: Hypoactive in all 4 extremities.  Medications:  Scheduled: . chlorhexidine gluconate (MEDLINE KIT)  15 mL Mouth Rinse BID  . Chlorhexidine Gluconate Cloth  6 each Topical Daily  . Chlorhexidine Gluconate Cloth  6 each Topical Q0600  . famotidine  20 mg Per Tube BID  . feeding supplement (PRO-STAT SUGAR FREE 64)  30 mL Per Tube QID  . feeding supplement (VITAL HIGH PROTEIN)  1,000 mL Per Tube Q24H  . free water  200 mL Per Tube Q4H  . mouth rinse  15 mL Mouth Rinse 10 times per day  . mupirocin ointment  1 application Nasal BID  . sodium chloride flush  10-40 mL Intracatheter Q12H  . sodium chloride flush  3 mL Intravenous Q12H   Continuous: . sodium chloride 100 mL/hr at 04-28-2018 0700  .  ampicillin-sulbactam (UNASYN) IV Stopped (04/28/18 2836)  . insulin (NOVOLIN-R) infusion 12 Units/hr (04/28/18 0857)  . potassium chloride 10 mEq (04/28/18 0858)   OQH:[id number], albuterol, docusate, fentaNYL (SUBLIMAZE) injection, hydrALAZINE, midazolam, ondansetron (ZOFRAN) IV, sodium chloride flush, sodium chloride flush  Pertinent Labs/Diagnostics:   Dg Chest Port 1 View  Result Date: 03/31/2018 CLINICAL DATA:  Endotracheal tube present, respiratory failure EXAM: PORTABLE CHEST 1 VIEW COMPARISON:  Portable chest x-ray of 03/30/2017 FINDINGS: The tip of the endotracheal tube is only 5 mm above the carina and could be withdrawn 2-3 cm. Cardiomegaly is stable and there appears to be mild pulmonary vascular congestion present. NG tube extends below the hemidiaphragm. Left IJ central venous line tip overlies the expected right atrium. IMPRESSION: 1. Tip of endotracheal tube only 5 mm above the carina. Recommend withdrawing by 2-3 cm. 2. Cardiomegaly. Question of pulmonary vascular congestion now present. Electronically Signed   By: Ivar Drape M.D.   On: 03/31/2018 09:58    Exam and interval history documented by Laurey Morale, NP-C, [facility] [phone]  Assessment: 62 year old female with anoxic brain injury 1. Repeat exam 72 hours since event still with no evidence for cortical function. There is loss of several brainstem reflexes. No improvement since last exam. These findings are suggestive of a high likelihood of a poor outcome 2. 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 CT findings at this early stage are also predictive of a poor outcome.  Recommendations: 1. Will discuss neurological prognosis with family at 2 PM today. 2. Continue supportive care.  35 minutes spent in the neurological evaluation and management of this critically ill  patient.  Addendum: Met with family at 2:25 PM to discuss prognosis. I educated family regarding the mechanism of the patient's brain injury as well as the dismal prognosis. Family expressed understanding and all questions were answered. The family would like an apnea test to be performed before making any final decisions.   Additional 30 minutes of critical care time spent in meeting with family.   Electronically signed: Dr. Kerney Elbe 04-27-2018, 8:58 AM

## 2018-04-25 NOTE — Progress Notes (Signed)
[facility]  Admission date: 2018-04-20 Referring provider: Dr. Dimple Casey, ER CC: Altered mental status  HPI: 62 yo female smoker found unresponsive at home with cardiac arrest after having progressive dyspnea for weeks.  UDS positive for cocaine. PMHx of Anxiety, DM, GERD, HLD, HTN, CHF, Neuropathy.  Subjective: Off all sedation.  Extensive noxious stimuli.  Neurologically she appears to be herniating.  Vital signs: BP 138/63   Pulse 75   Temp 98.6 F (37 C) (Bladder)   Resp (!) 22   Ht  (1.727 m)   Wt 102.1 kg (225 lb 1.4 oz) Comment: pads subtracted   SpO2 100%   BMI 34.22 kg/m   Intake/outpt: I/O last 3 completed shifts: In: 3652.3 [I.V.:735.5; NG/GT:1400; IV Piggyback:1516.8] Out: 2955 [Urine:2955]  Physical exam:  General: Morbidly obese female full mechanical ventilatory support HEENT: Endotracheal tube gastric tube in place Neuro: Negative doll's eyes, pupils are fixed at 4 mm.  No gag reflex.  Extensive noxious stimuli CV: s1s2 rrr, no m/r/g PULM: even/non-labored, lungs bilaterally coarse rhonchi WJ:XBJY, non-tender, bsx4 active  Extremities: warm/dry, 1+ edema  Skin: no rashes or lesions     CBC Recent Labs    2018-04-20 1512 03/30/18 1047 03/31/18 0525  WBC 21.5* 21.5* 22.6*  HGB 10.9* 11.3* 10.3*  HCT 36.8 36.1 33.8*  PLT 324 298 234    Coag's Recent Labs    04/20/2018 1743  APTT 25  INR 1.17    BMET Recent Labs    03/30/18 0256 03/31/18 0525 03/25/2018 0429  NA 147* 151* 162*  K 3.6 2.8* 3.1*  CL 108 117* 129*  CO2 BUN 20 27* 35*  CREATININE 1.56* 1.99* 1.55*  GLUCOSE 148* 223* 189*    Electrolytes Recent Labs    03/30/18 0256  03/30/18 1645 03/31/18 0525 03/31/18 1633 03/31/2018 0429  CALCIUM 8.9  --   --  8.7*  --  9.5  MG 2.7*   < > 2.4 2.3 2.3  --   PHOS 2.0*   < > 1.5* 1.9* 1.3*  --    < > = values in this interval not displayed.    Sepsis Markers Recent Labs    04/20/2018 1738   PROCALCITON 0.19    ABG Recent Labs    Apr 20, 2018 1759 03/30/18 0450  PHART 7.213* 7.478*  PCO2ART 57.0* 34.1  PO2ART 557.0* 119*    Liver Enzymes Recent Labs    2018-04-20 1512 03/31/18 0525  AST 307* 63*  ALT 193* 95*  ALKPHOS 99 88  BILITOT 0.6 0.6  ALBUMIN 2.9* 2.4*    Cardiac Enzymes Recent Labs    2018-04-20 1738 20-Apr-2018 1800 03/30/18 0256  TROPONINI 0.05* 0.09* 0.06*    Glucose Recent Labs    04/19/2018 0249 04/13/2018 0353 03/31/2018 0457 04/24/2018 0556 04/03/2018 0702 04/12/2018 0755  GLUCAP 147* 156* 169* 161* 175* 173*    Imaging Dg Chest Port 1 View  Result Date: 03/31/2018 CLINICAL DATA:  Endotracheal tube present, respiratory failure EXAM: PORTABLE CHEST 1 VIEW COMPARISON:  Portable chest x-ray of 03/30/2017 FINDINGS: The tip of the endotracheal tube is only 5 mm above the carina and could be withdrawn 2-3 cm. Cardiomegaly is stable and there appears to be mild pulmonary vascular congestion present. NG tube extends below the hemidiaphragm. Left IJ central venous line tip overlies the expected right atrium. IMPRESSION: 1. Tip of endotracheal tube only 5 mm above the carina. Recommend withdrawing by 2-3 cm. 2. Cardiomegaly. Question of pulmonary  vascular congestion now present. Electronically Signed   By: Dwyane Dee M.D.   On: 03/31/2018 09:58    Studies: CT head 5/05 >> concern for anoxic injury Echo 5/06 >> EF 55 to 60% grade 1 diastolic dysfunction, mild mitral valve regurgitation  Cultures: Blood 5/05 >> Sputum 5/05 >> moderate Haemophilus influenza few  group B strep  Antibiotics: Unasyn 5/05 >>   Events: 5/05 Admit  Lines/tubes: ETT 5/05 >> Lt IJ CVL 5/05 >>  Discussion: 62 year old with PEA arrest neurological exam devastating neurological injury.  Being evaluated by neurology.  Neuro status is poor..  Assessment/plan:  Acute respiratory failure. Continue vent bundle She is unable to wean due to her severe anoxic injury  PEA cardiac  arrest. Hx of HTN, HLD. -Completed 36 degrees dermal protocol  Acute anoxic/metabolic encephalopathy. 2018/04/04 neurology has evaluated patient in regard to poor prognosis and are going to reevaluate from a neurological standpoint at 1400 hrs. on 04-Apr-2018  DM. 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 DVT prophylaxis with PAS  Apr 04, 2018 family member updated at his bedside.  We are awaiting further input from neurology at 1400 hrs. on 04-04-18  App cct 45 min   Brett Canales Minor ACNP [facility] P[facility] Pager [phone] till 1 pm If no answer page [phone] 2018-04-04, 9:33 AM

## 2018-04-25 NOTE — Progress Notes (Signed)
Attempted apnea test.  SpO2 decreased to 78% at about 4.5 minutes, so test aborted.    Pretest ABG - pH 7.37, PCO2 36.1, PO2 107 Posttest ABG - pH 7.24, PCO2 53.8, PO2 51   Updated pts family at bedside.  They understand that her neuro prognosis is grim and would not want to keep her on life support.  Decision made to transition to comfort measures and proceed with extubation.  Coralyn Helling, MD [facility] Pulmonary/Critical Care 04/11/2018, 4:27 PM

## 2018-04-25 NOTE — Progress Notes (Signed)
Patient asystole on monitor at 1725, no heart tones or breath sounds auscultated by 2 RNs; [facility] & Gilda Crease.  Family at bedside and support and chaplain services offered.  MD notified. Wilfred Curtis  45 cc of Morphine wasted in sink witnessed by Clydene Pugh, RN & Anderson Malta., RN.

## 2018-04-25 NOTE — Progress Notes (Signed)
At 1601 apnea test started per order and policy.  At 1605 pt became unstable, dropped sat 78%.  ABG immediately drawn and pt placed back on vent.  Sat immediately increased to 98%.  MD and NP at bedside to review ABG results and family now at bedside w/ MD and RN.

## 2018-04-25 NOTE — Discharge Summary (Signed)
Linda Miller was a 62 y.o. female smoker admitted on 04/13/2018 with cardiac arrest.  She had progressive dyspnea prior to this.  Her UDS was positive for cocaine.  CT head showed anoxic injury.  Sputum grew H influenza.  No improvement in mental status.  Neuro consulted.  Family opted for DNR status and comfort measures.  She was extubated and expired on 2018-04-17.  Cause of death: Anoxic encephalopathy from respiratory leading to PEA cardiac arrest in setting of community acquired and aspiration pneumonia with Haemophilus influenza.  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

## 2018-04-25 DEATH — deceased

## 2019-05-04 IMAGING — CT CT HEAD W/O CM
4 series · 15 of 47 positions shown, 17 images · non-contrast
Comparison: 12/22/2006.

CLINICAL DATA: Found unresponsive and pulseless.  Post CPR.

EXAM:
CT HEAD WITHOUT CONTRAST
TECHNIQUE: Contiguous axial images were obtained from the base of the skull
through the vertex without intravenous contrast.

[Series 3: head wo · axial · 0.48mm/px · z∈[-184,-64]mm · 7 of 34 slices shown, 9 images]
[im 5/34  brain]
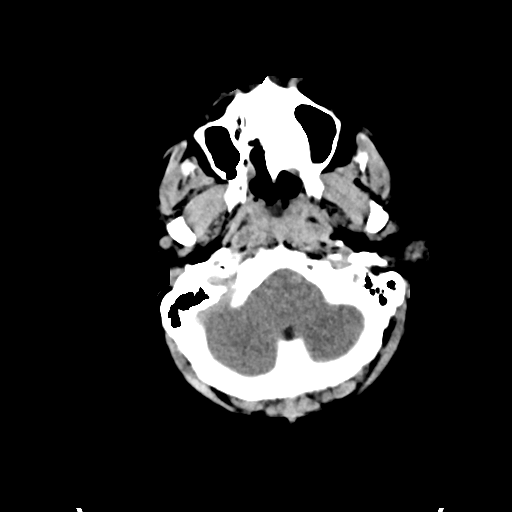
[im 5/34  bone]
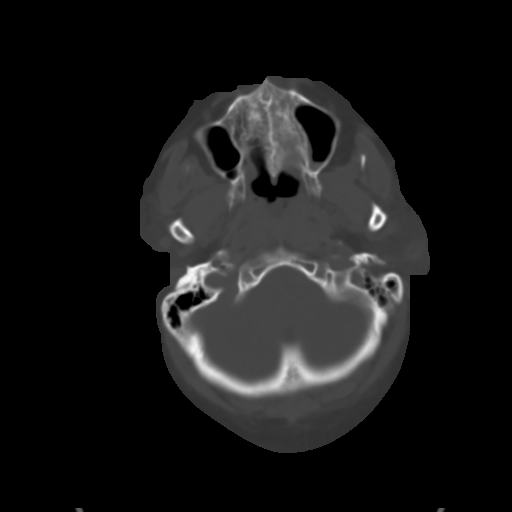
[im 9/34  brain]
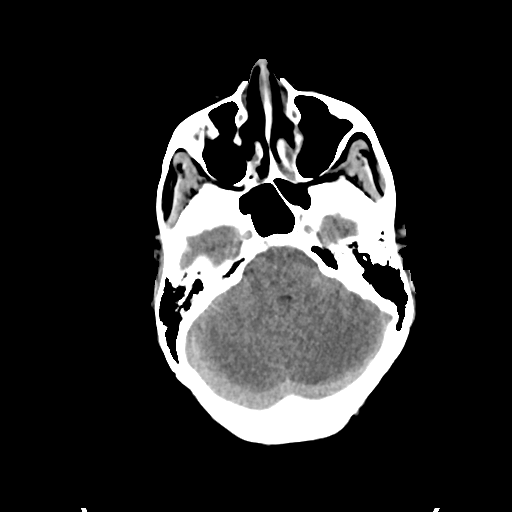
[im 13/34  brain]
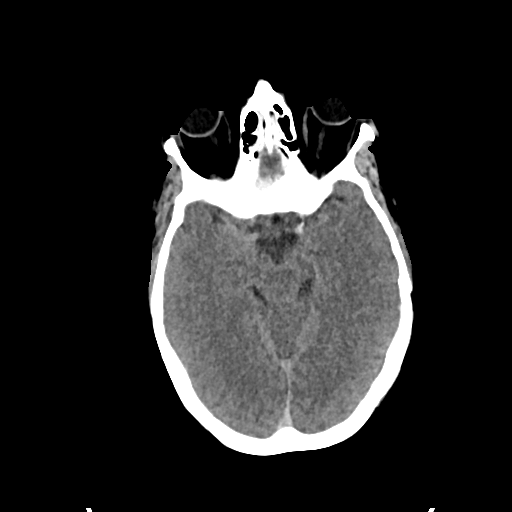
[im 17/34  brain]
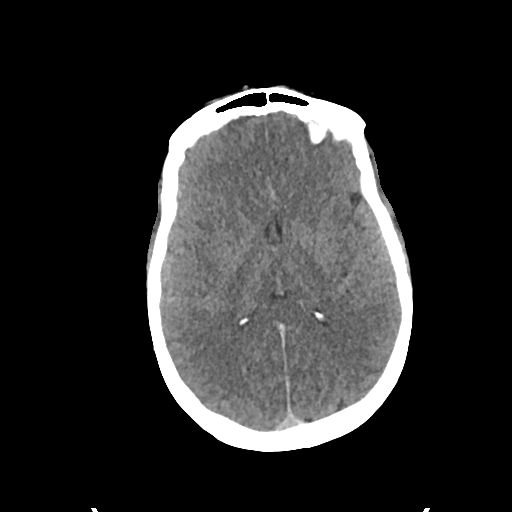
[im 21/34  brain]
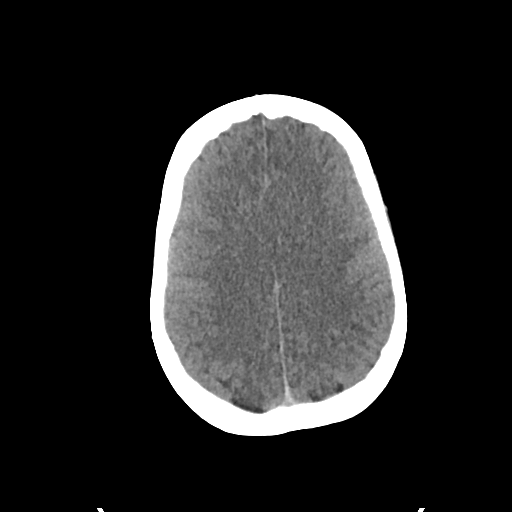
[im 21/34  bone]
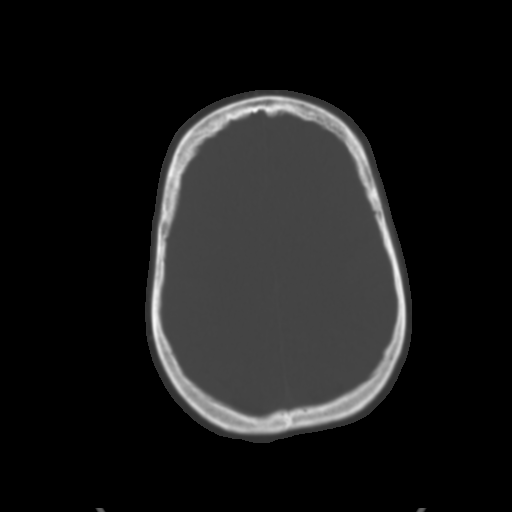
[im 25/34  brain]
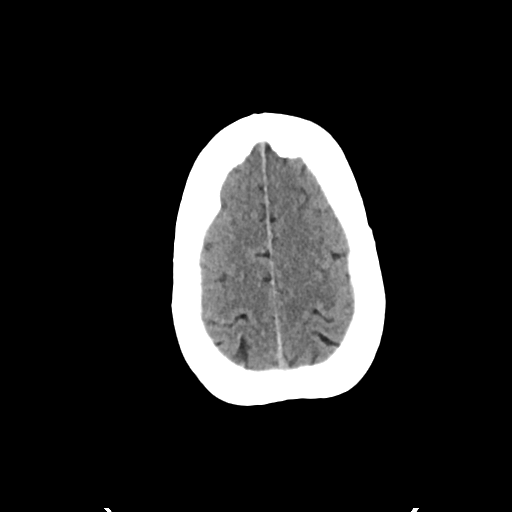
[im 29/34  brain]
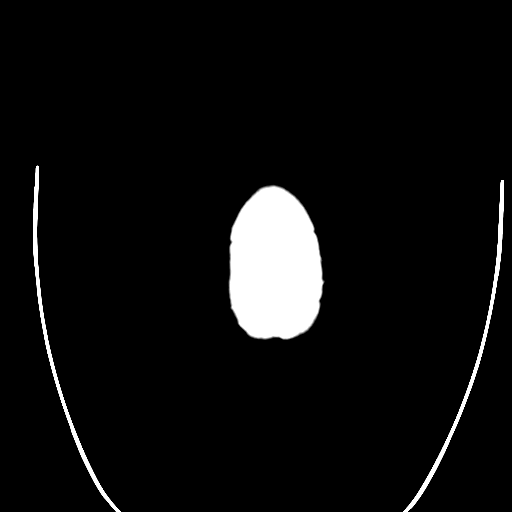

[Series 4: head bone · axial · 0.48mm/px · z∈[-188,-172]mm · 2 of 84 slices shown]
[im 9/84  bone]
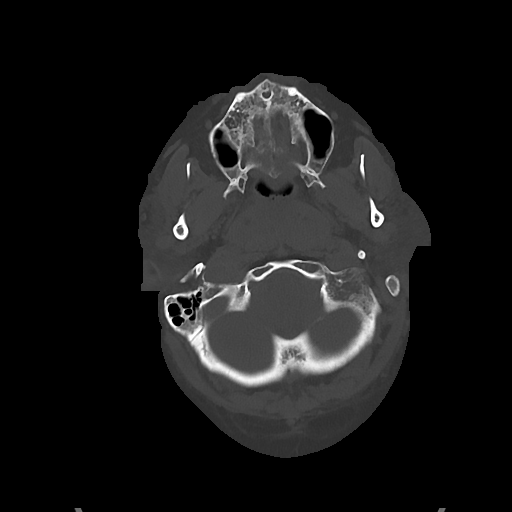
[im 17/84  bone]
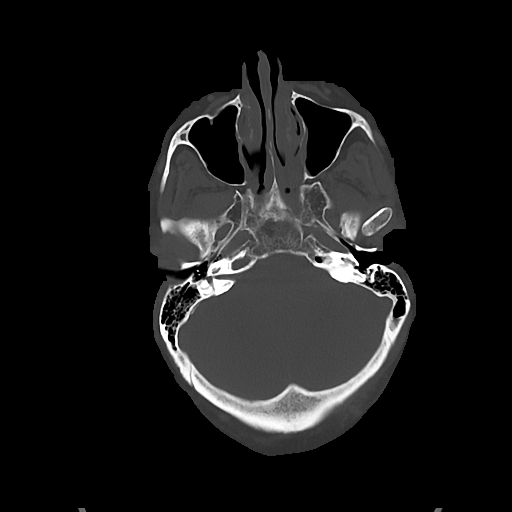

[Series 5: cor soft · coronal · 0.32mm/px · 3 of 67 slices shown]
[im 23/67  brain]
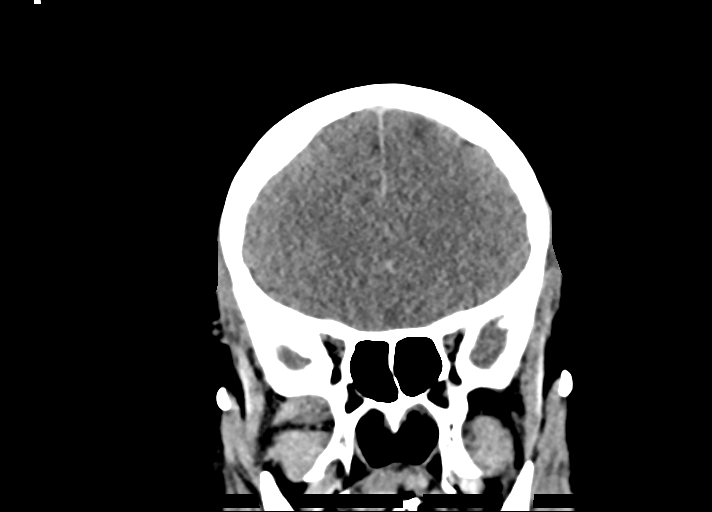
[im 30/67  brain]
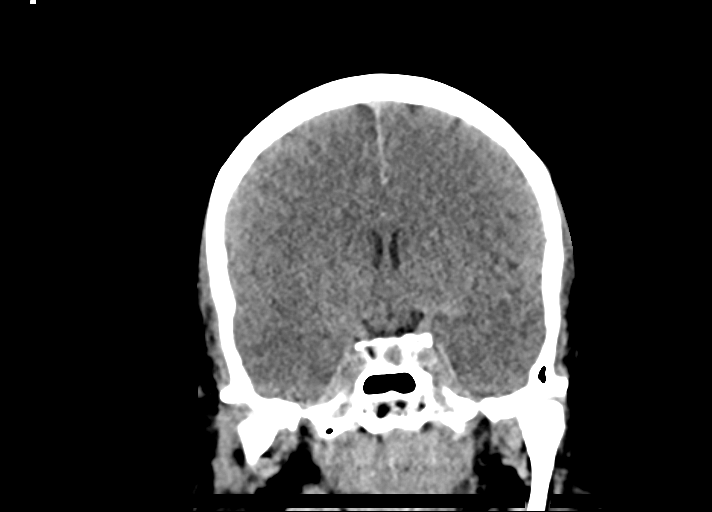
[im 37/67  brain]
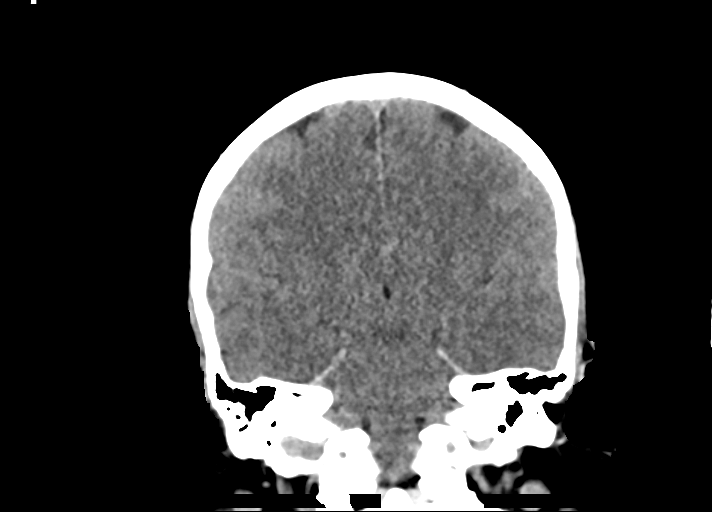

[Series 6: sag soft · sagittal · 0.32mm/px · 3 of 66 slices shown]
[im 22/66  brain]
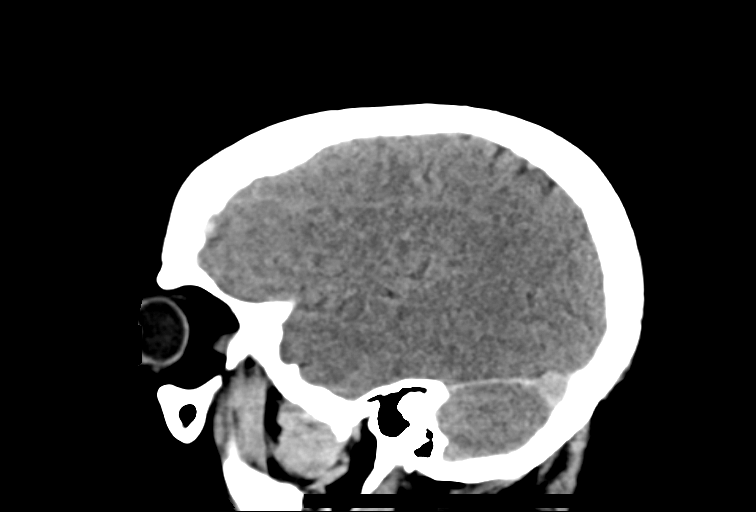
[im 33/66  brain]
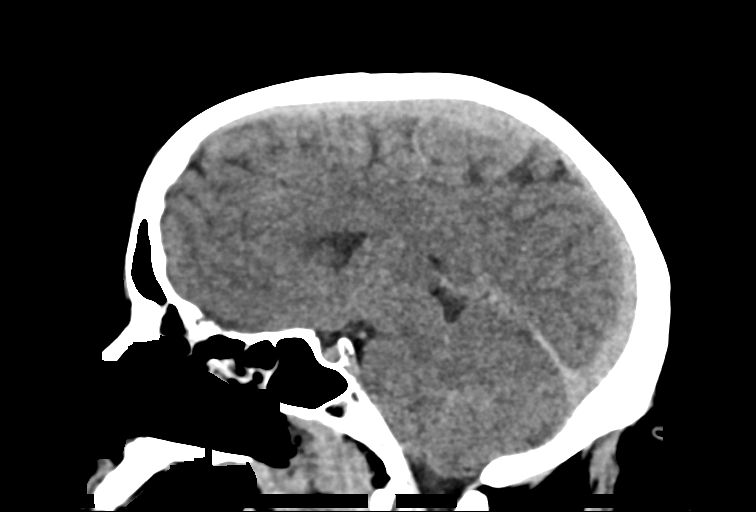
[im 44/66  brain]
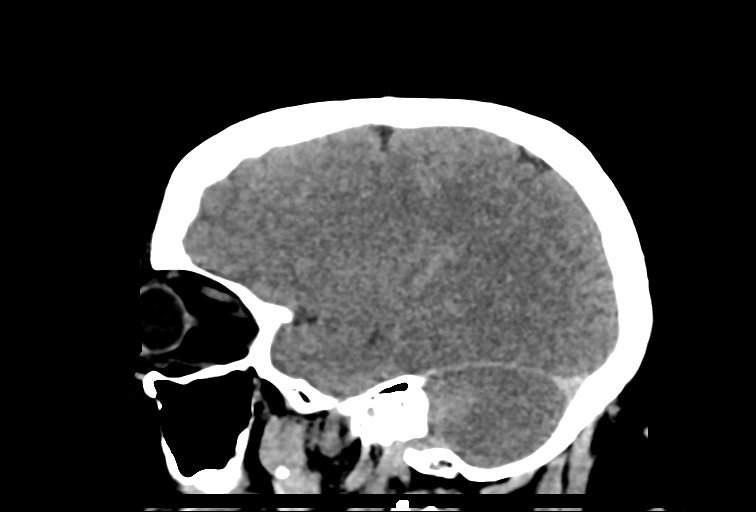

[15 of 47 positions shown; findings below may reference images not displayed]

FINDINGS: Brain: When compared with 12/22/2006, there appears to be slight
blurring of the gray-white interface. Associated hyper attenuation
of the vascular structures (i.e., pseudo subarachnoid hemorrhage
appearance). Ventricles appears small. Otherwise, no evidence of a
mass lesion or hydrocephalus.

Vascular: No hyperdense vessel or unexpected calcification.

Skull: Normal. Negative for fracture or focal lesion.

Sinuses/Orbits: Opacification of the ethmoid air cells. Mucosal
thickening in the maxillary sinuses. Mastoid air cells are clear.

Other: None.
IMPRESSION: Slight blurring of the gray-white interface with a pseudo
subarachnoid hemorrhage appearance, findings suggestive of hypoxic
ischemic brain injury.

## 2019-05-06 IMAGING — DX DG CHEST 1V PORT
1 series · 1 of 1 positions shown · non-contrast
Comparison: Portable chest x-ray of 03/30/2017

CLINICAL DATA: Endotracheal tube present, respiratory failure

EXAM:
PORTABLE CHEST 1 VIEW

[chest]
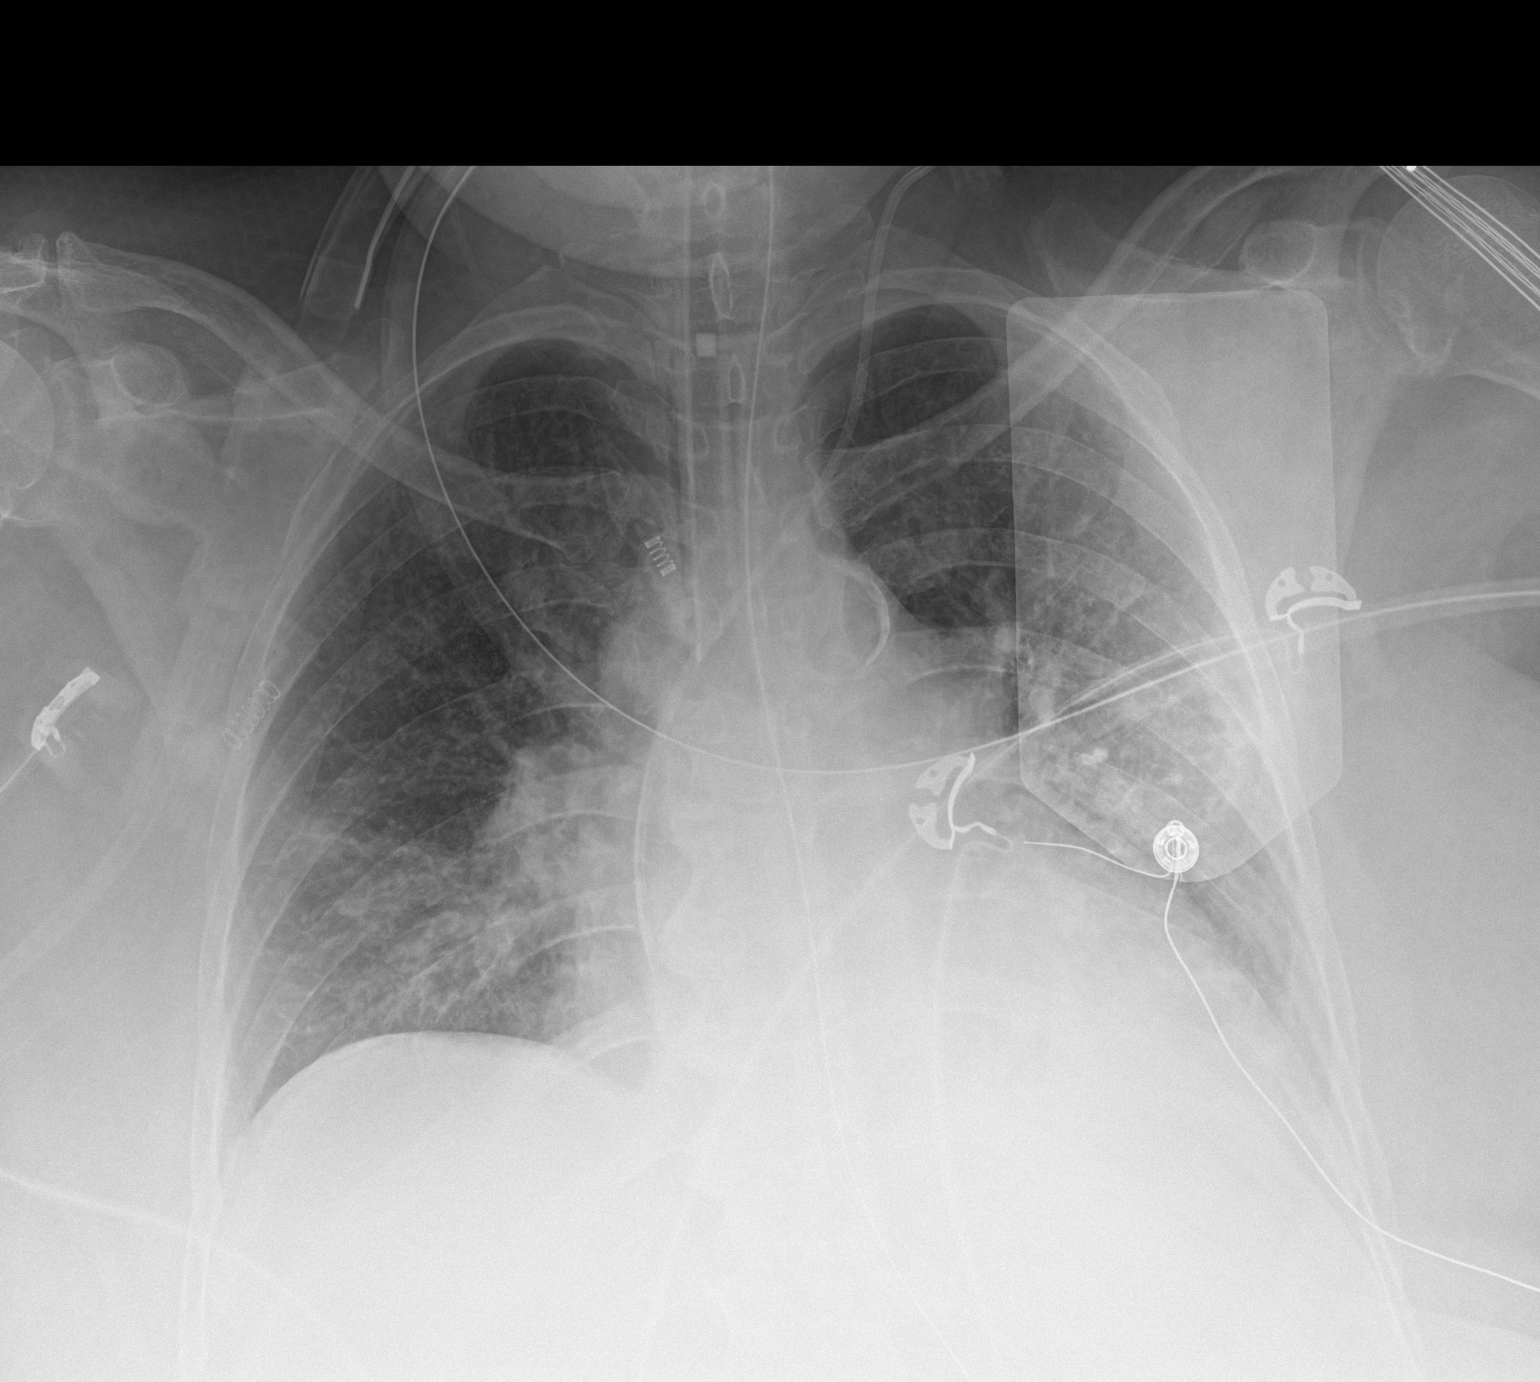

[1 of 1 positions shown; findings below may reference images not displayed]

FINDINGS: The tip of the endotracheal tube is only 5 mm above the carina and
could be withdrawn 2-3 cm. Cardiomegaly is stable and there appears
to be mild pulmonary vascular congestion present. NG tube extends
below the hemidiaphragm. Left IJ central venous line tip overlies
the expected right atrium.
IMPRESSION: 1. Tip of endotracheal tube only 5 mm above the carina. Recommend
withdrawing by 2-3 cm.
2. Cardiomegaly. Question of pulmonary vascular congestion now
present.

## 2019-05-07 IMAGING — DX DG CHEST 1V PORT
1 series · 1 of 1 positions shown · non-contrast
Comparison: 03/31/2018

CLINICAL DATA: Respiratory failure

EXAM:
PORTABLE CHEST 1 VIEW

[chest ap]
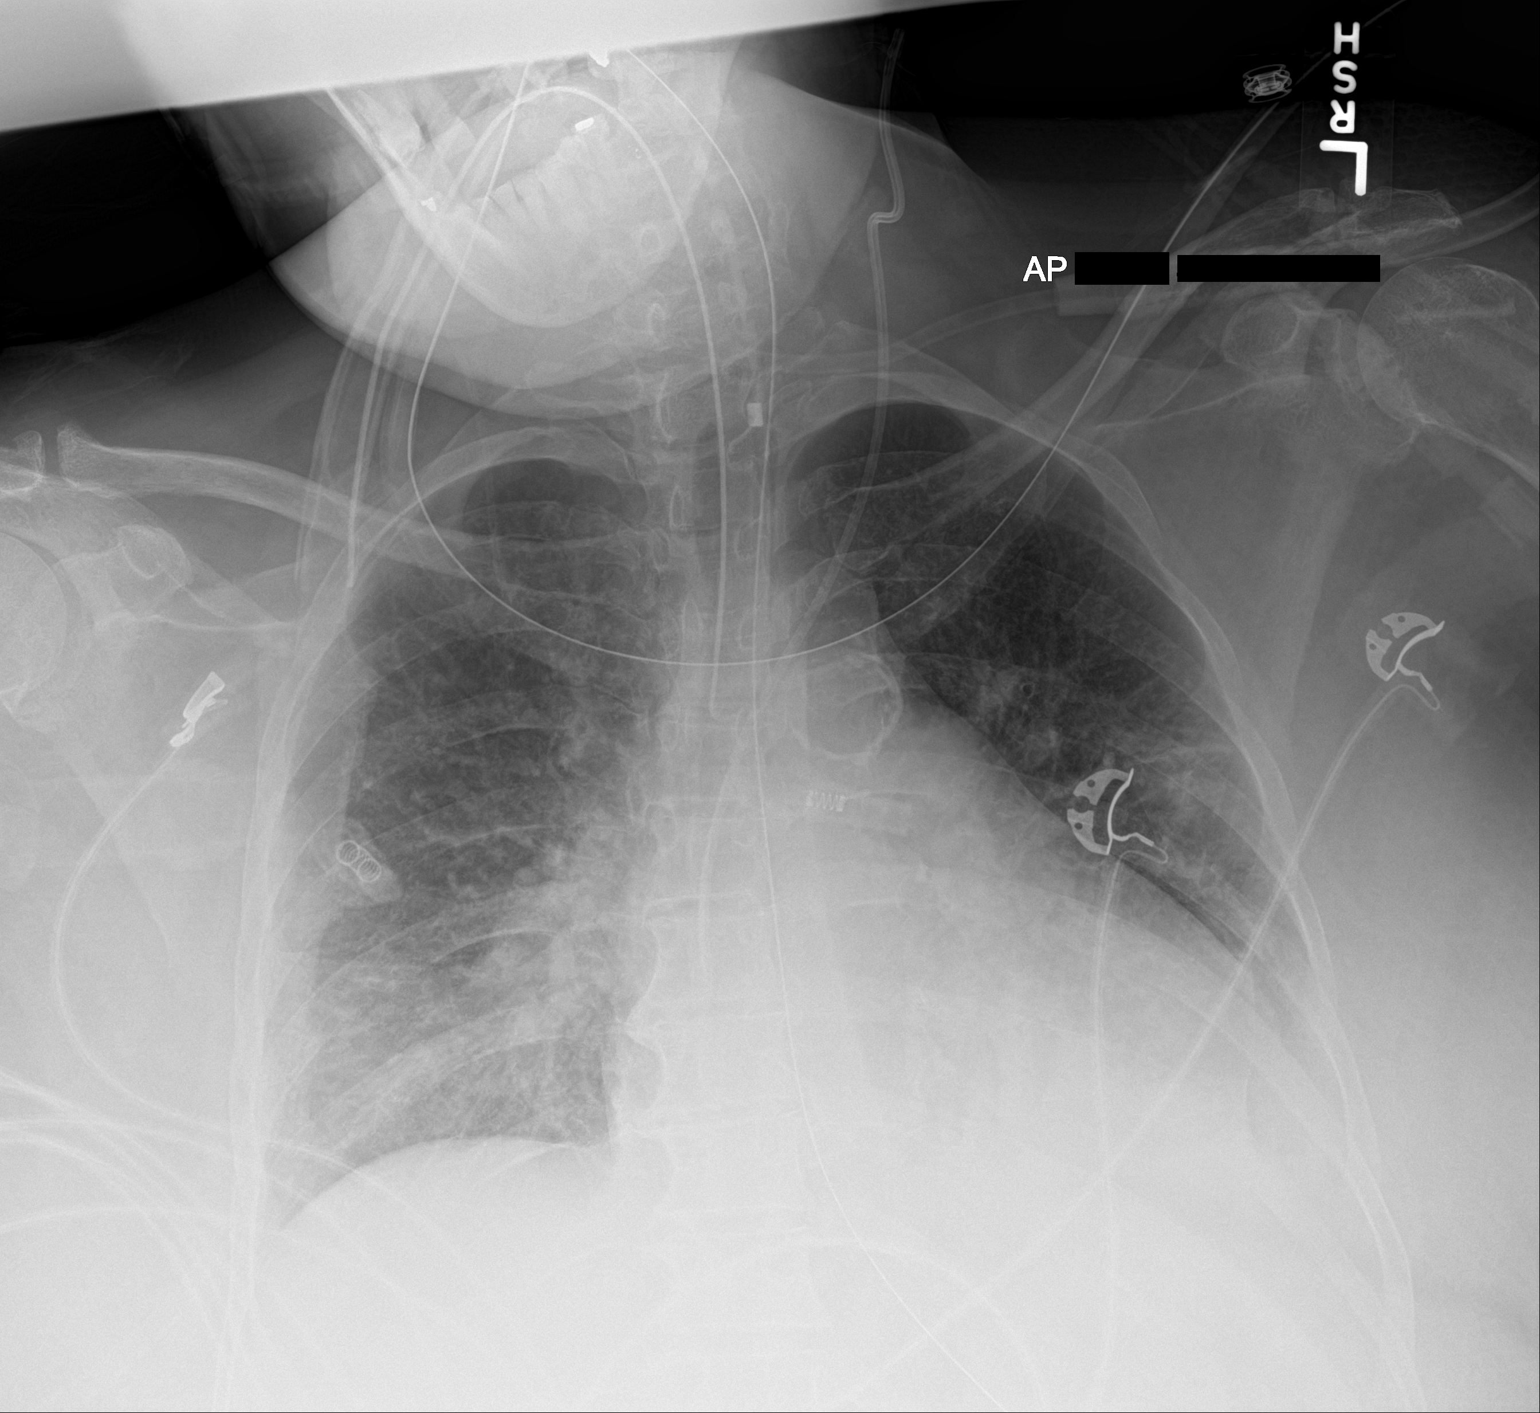

[1 of 1 positions shown; findings below may reference images not displayed]

FINDINGS: Endotracheal tube and nasogastric catheter are noted in satisfactory
position. Left jugular central line is again seen and stable. Aortic
calcifications are noted. Mild cardiomegaly is again seen. Vascular
congestion is again noted. Increasing bibasilar infiltrative changes
are noted with small left pleural effusion.
IMPRESSION: Slight increase in the degree of bibasilar changes with new left
pleural effusion.
